# Patient Record
Sex: Female | Born: 1955
Health system: Southern US, Community
[De-identification: ages and names within clinical notes are randomized; demographics above are authoritative.]

## PROBLEM LIST (undated history)

## (undated) DIAGNOSIS — K219 Gastro-esophageal reflux disease without esophagitis: Secondary | ICD-10-CM

## (undated) DIAGNOSIS — I639 Cerebral infarction, unspecified: Secondary | ICD-10-CM

## (undated) DIAGNOSIS — N63 Unspecified lump in unspecified breast: Secondary | ICD-10-CM

## (undated) DIAGNOSIS — G459 Transient cerebral ischemic attack, unspecified: Secondary | ICD-10-CM

## (undated) DIAGNOSIS — E785 Hyperlipidemia, unspecified: Secondary | ICD-10-CM

## (undated) DIAGNOSIS — T8859XA Other complications of anesthesia, initial encounter: Secondary | ICD-10-CM

## (undated) DIAGNOSIS — R112 Nausea with vomiting, unspecified: Secondary | ICD-10-CM

## (undated) DIAGNOSIS — C3401 Malignant neoplasm of right main bronchus: Secondary | ICD-10-CM

## (undated) DIAGNOSIS — Z923 Personal history of irradiation: Secondary | ICD-10-CM

## (undated) DIAGNOSIS — T7840XA Allergy, unspecified, initial encounter: Secondary | ICD-10-CM

## (undated) DIAGNOSIS — Z9889 Other specified postprocedural states: Secondary | ICD-10-CM

## (undated) DIAGNOSIS — Z9225 Personal history of immunosupression therapy: Secondary | ICD-10-CM

## (undated) DIAGNOSIS — Z9221 Personal history of antineoplastic chemotherapy: Secondary | ICD-10-CM

## (undated) HISTORY — DX: Cerebral infarction, unspecified: I63.9

## (undated) HISTORY — DX: Unspecified lump in unspecified breast: N63.0

## (undated) HISTORY — DX: Hyperlipidemia, unspecified: E78.5

## (undated) HISTORY — PX: TUBAL LIGATION: SHX77

## (undated) HISTORY — PX: BREAST CYST ASPIRATION: SHX578

## (undated) HISTORY — DX: Allergy, unspecified, initial encounter: T78.40XA

## (undated) HISTORY — DX: Gastro-esophageal reflux disease without esophagitis: K21.9

## (undated) HISTORY — PX: CHOLECYSTECTOMY: SHX55

## (undated) HISTORY — PX: OVARY SURGERY: SHX727

---

## 2003-01-18 ENCOUNTER — Other Ambulatory Visit: Admission: RE | Admit: 2003-01-18 | Discharge: 2003-01-18 | Payer: Self-pay | Admitting: Family Medicine

## 2003-12-26 ENCOUNTER — Ambulatory Visit: Payer: Self-pay | Admitting: Family Medicine

## 2004-01-19 ENCOUNTER — Other Ambulatory Visit: Admission: RE | Admit: 2004-01-19 | Discharge: 2004-01-19 | Payer: Self-pay | Admitting: Family Medicine

## 2004-01-19 ENCOUNTER — Encounter (INDEPENDENT_AMBULATORY_CARE_PROVIDER_SITE_OTHER): Payer: Self-pay | Admitting: Internal Medicine

## 2004-01-19 ENCOUNTER — Ambulatory Visit: Payer: Self-pay | Admitting: Family Medicine

## 2004-01-25 ENCOUNTER — Ambulatory Visit: Payer: Self-pay | Admitting: Family Medicine

## 2004-02-27 ENCOUNTER — Ambulatory Visit: Payer: Self-pay | Admitting: Internal Medicine

## 2004-03-13 ENCOUNTER — Ambulatory Visit: Payer: Self-pay | Admitting: Family Medicine

## 2004-03-15 ENCOUNTER — Ambulatory Visit: Payer: Self-pay | Admitting: Family Medicine

## 2004-08-30 ENCOUNTER — Ambulatory Visit: Payer: Self-pay | Admitting: Family Medicine

## 2004-09-17 ENCOUNTER — Ambulatory Visit: Payer: Self-pay | Admitting: Family Medicine

## 2007-06-03 ENCOUNTER — Ambulatory Visit: Payer: Self-pay | Admitting: Family Medicine

## 2007-06-22 ENCOUNTER — Ambulatory Visit: Payer: Self-pay | Admitting: Family Medicine

## 2007-06-30 ENCOUNTER — Ambulatory Visit: Payer: Self-pay | Admitting: Family Medicine

## 2007-06-30 ENCOUNTER — Encounter: Payer: Self-pay | Admitting: Family Medicine

## 2007-11-27 ENCOUNTER — Ambulatory Visit: Payer: Self-pay | Admitting: Family Medicine

## 2007-12-02 ENCOUNTER — Encounter: Payer: Self-pay | Admitting: Family Medicine

## 2007-12-02 ENCOUNTER — Ambulatory Visit: Payer: Self-pay | Admitting: Family Medicine

## 2007-12-17 ENCOUNTER — Other Ambulatory Visit: Admission: RE | Admit: 2007-12-17 | Discharge: 2007-12-17 | Payer: Self-pay | Admitting: Family Medicine

## 2007-12-17 ENCOUNTER — Ambulatory Visit: Payer: Self-pay | Admitting: Family Medicine

## 2007-12-17 ENCOUNTER — Ambulatory Visit: Payer: Self-pay | Admitting: Cardiology

## 2007-12-17 ENCOUNTER — Encounter (INDEPENDENT_AMBULATORY_CARE_PROVIDER_SITE_OTHER): Payer: Self-pay | Admitting: Internal Medicine

## 2007-12-18 LAB — CONVERTED CEMR LAB
ALT: 24 units/L (ref 0–35)
AST: 24 units/L (ref 0–37)
Alkaline Phosphatase: 107 units/L (ref 39–117)
Bilirubin, Direct: 0.1 mg/dL (ref 0.0–0.3)
CO2: 32 meq/L (ref 19–32)
Cholesterol: 234 mg/dL (ref 0–200)
Eosinophils Relative: 1.5 % (ref 0.0–5.0)
Glucose, Bld: 96 mg/dL (ref 70–99)
Hemoglobin: 14.4 g/dL (ref 12.0–15.0)
Lymphocytes Relative: 32.2 % (ref 12.0–46.0)
Monocytes Relative: 6.3 % (ref 3.0–12.0)
Platelets: 296 10*3/uL (ref 150–400)
Potassium: 3.6 meq/L (ref 3.5–5.1)
RDW: 11.8 % (ref 11.5–14.6)
Sodium: 138 meq/L (ref 135–145)
Total CHOL/HDL Ratio: 6.7
Total Protein: 7.6 g/dL (ref 6.0–8.3)
VLDL: 34 mg/dL (ref 0–40)
WBC: 8.8 10*3/uL (ref 4.5–10.5)

## 2007-12-24 ENCOUNTER — Encounter (INDEPENDENT_AMBULATORY_CARE_PROVIDER_SITE_OTHER): Payer: Self-pay | Admitting: *Deleted

## 2007-12-25 ENCOUNTER — Encounter (INDEPENDENT_AMBULATORY_CARE_PROVIDER_SITE_OTHER): Payer: Self-pay | Admitting: Internal Medicine

## 2007-12-25 ENCOUNTER — Ambulatory Visit: Payer: Self-pay | Admitting: Family Medicine

## 2007-12-25 DIAGNOSIS — E7800 Pure hypercholesterolemia, unspecified: Secondary | ICD-10-CM | POA: Insufficient documentation

## 2008-02-08 ENCOUNTER — Ambulatory Visit: Payer: Self-pay | Admitting: Family Medicine

## 2008-02-10 LAB — CONVERTED CEMR LAB
AST: 32 units/L (ref 0–37)
HDL: 43.8 mg/dL (ref >39.0)
LDL Cholesterol: 103 mg/dL — ABNORMAL HIGH (ref 0–99)
Total CHOL/HDL Ratio: 4.1
VLDL: 32 mg/dL (ref 0–40)

## 2008-04-06 ENCOUNTER — Encounter (INDEPENDENT_AMBULATORY_CARE_PROVIDER_SITE_OTHER): Payer: Self-pay | Admitting: Internal Medicine

## 2008-05-17 ENCOUNTER — Ambulatory Visit: Payer: Self-pay | Admitting: Family Medicine

## 2008-05-17 LAB — CONVERTED CEMR LAB
Ketones, urine, test strip: NEGATIVE
Nitrite: NEGATIVE
Specific Gravity, Urine: <1.005
pH: 6

## 2008-05-18 ENCOUNTER — Encounter (INDEPENDENT_AMBULATORY_CARE_PROVIDER_SITE_OTHER): Payer: Self-pay | Admitting: Internal Medicine

## 2008-05-18 ENCOUNTER — Ambulatory Visit: Payer: Self-pay | Admitting: Cardiology

## 2008-05-18 DIAGNOSIS — K5730 Diverticulosis of large intestine without perforation or abscess without bleeding: Secondary | ICD-10-CM | POA: Insufficient documentation

## 2008-05-25 ENCOUNTER — Encounter (INDEPENDENT_AMBULATORY_CARE_PROVIDER_SITE_OTHER): Payer: Self-pay | Admitting: Internal Medicine

## 2008-05-31 ENCOUNTER — Encounter (INDEPENDENT_AMBULATORY_CARE_PROVIDER_SITE_OTHER): Payer: Self-pay | Admitting: Internal Medicine

## 2008-06-07 ENCOUNTER — Encounter (INDEPENDENT_AMBULATORY_CARE_PROVIDER_SITE_OTHER): Payer: Self-pay | Admitting: Internal Medicine

## 2008-06-09 ENCOUNTER — Encounter (INDEPENDENT_AMBULATORY_CARE_PROVIDER_SITE_OTHER): Payer: Self-pay | Admitting: Internal Medicine

## 2008-07-12 ENCOUNTER — Encounter (INDEPENDENT_AMBULATORY_CARE_PROVIDER_SITE_OTHER): Payer: Self-pay | Admitting: Obstetrics and Gynecology

## 2008-07-12 ENCOUNTER — Ambulatory Visit (HOSPITAL_COMMUNITY): Admission: RE | Admit: 2008-07-12 | Discharge: 2008-07-12 | Payer: Self-pay | Admitting: Obstetrics and Gynecology

## 2008-08-18 ENCOUNTER — Ambulatory Visit: Payer: Self-pay | Admitting: Family Medicine

## 2008-08-18 LAB — CONVERTED CEMR LAB
Cholesterol: 167 mg/dL (ref 0–200)
LDL Cholesterol: 95 mg/dL (ref 0–99)
Triglycerides: 130 mg/dL (ref 0.0–149.0)

## 2009-01-18 ENCOUNTER — Ambulatory Visit: Payer: Self-pay | Admitting: Family Medicine

## 2009-01-18 ENCOUNTER — Encounter (INDEPENDENT_AMBULATORY_CARE_PROVIDER_SITE_OTHER): Payer: Self-pay | Admitting: Internal Medicine

## 2009-01-18 ENCOUNTER — Other Ambulatory Visit: Admission: RE | Admit: 2009-01-18 | Discharge: 2009-01-18 | Payer: Self-pay | Admitting: Family Medicine

## 2009-01-18 DIAGNOSIS — Z780 Asymptomatic menopausal state: Secondary | ICD-10-CM | POA: Insufficient documentation

## 2009-01-18 DIAGNOSIS — F17200 Nicotine dependence, unspecified, uncomplicated: Secondary | ICD-10-CM | POA: Insufficient documentation

## 2009-01-19 LAB — CONVERTED CEMR LAB
CO2: 30 meq/L (ref 19–32)
Calcium: 9.5 mg/dL (ref 8.4–10.5)
Glucose, Bld: 82 mg/dL (ref 70–99)
HDL: 46.4 mg/dL (ref >39.00)
LDL Cholesterol: 79 mg/dL (ref 0–99)
Sodium: 141 meq/L (ref 135–145)
Total CHOL/HDL Ratio: 3
Triglycerides: 94 mg/dL (ref 0.0–149.0)
VLDL: 18.8 mg/dL (ref 0.0–40.0)

## 2009-01-23 ENCOUNTER — Encounter (INDEPENDENT_AMBULATORY_CARE_PROVIDER_SITE_OTHER): Payer: Self-pay | Admitting: *Deleted

## 2009-01-25 ENCOUNTER — Encounter (INDEPENDENT_AMBULATORY_CARE_PROVIDER_SITE_OTHER): Payer: Self-pay | Admitting: Internal Medicine

## 2009-01-25 ENCOUNTER — Ambulatory Visit: Payer: Self-pay | Admitting: Family Medicine

## 2009-01-27 ENCOUNTER — Encounter (INDEPENDENT_AMBULATORY_CARE_PROVIDER_SITE_OTHER): Payer: Self-pay | Admitting: Internal Medicine

## 2009-01-27 DIAGNOSIS — M8580 Other specified disorders of bone density and structure, unspecified site: Secondary | ICD-10-CM | POA: Insufficient documentation

## 2009-02-02 ENCOUNTER — Ambulatory Visit: Payer: Self-pay | Admitting: Family Medicine

## 2009-02-02 ENCOUNTER — Encounter (INDEPENDENT_AMBULATORY_CARE_PROVIDER_SITE_OTHER): Payer: Self-pay | Admitting: Internal Medicine

## 2009-02-09 ENCOUNTER — Encounter: Payer: Self-pay | Admitting: Family Medicine

## 2009-07-24 ENCOUNTER — Ambulatory Visit: Payer: Self-pay | Admitting: Family Medicine

## 2009-07-24 LAB — CONVERTED CEMR LAB
AST: 28 units/L (ref 0–37)
Albumin: 4.1 g/dL (ref 3.5–5.2)
Alkaline Phosphatase: 69 units/L (ref 39–117)
Bilirubin, Direct: 0.1 mg/dL (ref 0.0–0.3)
LDL Cholesterol: 82 mg/dL (ref 0–99)
Total Bilirubin: 0.3 mg/dL (ref 0.3–1.2)
Total CHOL/HDL Ratio: 3
Triglycerides: 96 mg/dL (ref 0.0–149.0)

## 2009-08-01 ENCOUNTER — Ambulatory Visit: Payer: Self-pay | Admitting: Family Medicine

## 2009-08-01 DIAGNOSIS — M7542 Impingement syndrome of left shoulder: Secondary | ICD-10-CM | POA: Insufficient documentation

## 2010-01-23 ENCOUNTER — Ambulatory Visit: Payer: Self-pay | Admitting: Family Medicine

## 2010-01-23 LAB — CONVERTED CEMR LAB
AST: 27 units/L (ref 0–37)
BUN: 14 mg/dL (ref 6–23)
Calcium: 9.3 mg/dL (ref 8.4–10.5)
Cholesterol: 152 mg/dL (ref 0–200)
Creatinine, Ser: 0.8 mg/dL (ref 0.4–1.2)
GFR calc non Af Amer: 85.48 mL/min (ref >60.00)
LDL Cholesterol: 83 mg/dL (ref 0–99)
Potassium: 4 meq/L (ref 3.5–5.1)
Total Bilirubin: 0.4 mg/dL (ref 0.3–1.2)
VLDL: 24 mg/dL (ref 0.0–40.0)

## 2010-01-26 ENCOUNTER — Ambulatory Visit: Payer: Self-pay | Admitting: Family Medicine

## 2010-01-26 LAB — CONVERTED CEMR LAB
Cholesterol, target level: 200 mg/dL
HDL goal, serum: 40 mg/dL
LDL Goal: 160 mg/dL

## 2010-03-13 NOTE — Letter (Signed)
Summary: Results Follow up Letter  [place] at [facility]  [address] [place], [place] [address]   Phone: [phone]  Fax: [phone]    02/09/2009 MRN: [id number]    Connie West [address] [place], [place]  [address]    Dear Ms. Sun,  The following are the results of your recent test(s):  Test         Result    Pap Smear:        Normal _____  Not Normal _____ Comments: ______________________________________________________ Cholesterol: LDL(Bad cholesterol):         Your goal is less than:         HDL (Good cholesterol):       Your goal is more than: Comments:  ______________________________________________________ Mammogram:        Normal __X___  Not Normal _____ Comments:Repeat in one year.  ___________________________________________________________________ Hemoccult:        Normal _____  Not normal _______ Comments:    _____________________________________________________________________ Other Tests:    We routinely do not discuss normal results over the telephone.  If you desire a copy of the results, or you have any questions about this information we can discuss them at your next office visit.   Sincerely,    Everrett Coombe, FNP  BB/ri

## 2010-03-13 NOTE — Assessment & Plan Note (Signed)
Summary: pain in shoulder/ billies patient   Vital Signs:  Patient profile:   55 year old female Height:      64.5 inches Weight:      123.4 pounds BMI:     20.93 Temp:     98.7 degrees F oral Pulse rate:   72 / minute Pulse rhythm:   regular BP sitting:   120 / 70  (left arm) Cuff size:   regular  Vitals Entered By: Benny Lennert CMA Duncan Dull) (August 01, 2009 3:40 PM)  History of Present Illness: Chief complaint Left shoulder pain  55 year old female:  Ongoing for a week  less shoulder pain, has a dull ache, in the anterolateral portion of the  shoulder, in and around the coracoid region in the subacromial region. Initially she also complained of a dull ache, sometimes is awakened up at night. She also has some lateral pain. Some mild pain with abduction and with crossing-over.  REVIEW OF SYSTEMS  GEN: No systemic complaints, no fevers, chills, sweats, or other acute illnesses MSK: Detailed in the HPI GI: tolerating PO intake without difficulty Neuro: No numbness, parasthesias, or tingling associated. Otherwise the pertinent positives of the ROS are noted above.    GEN: Well-developed,well-nourished,in no acute distress; alert,appropriate and cooperative throughout examination HEENT: Normocephalic and atraumatic without obvious abnormalities. No apparent alopecia or balding. Ears, externally no deformities PULM: Breathing comfortably in no respiratory distress EXT: No clubbing, cyanosis, or edema PSYCH: Normally interactive. Cooperative during the interview. Pleasant. Friendly and conversant. Not anxious or depressed appearing. Normal, full affect.   Low shoulder: Full range of motion. There is some scapular dyskinesis noted. Nontender at the acromioclavicular joint.  Minimal tenderness bicipital groove. There is some fullness the subacromial bursa. Eyelid positive crossover test.   Strength testing is 5/5 throughout.  Negative drop test. Mild positive Leanord Asal  test. Mildly positive Neer test.  No history of trauma or fracture. No history of operative intervention Allergies: 1)  ! Pcn  Past History:  Past medical, surgical, family and social histories (including risk factors) reviewed, and no changes noted (except as noted below).  Past Medical History: Reviewed history from 12/25/2007 and no changes required. Right breast mass- biopsy 2001, negative Hyperlipidemia (01/2004)  Past Surgical History: Reviewed history from 01/18/2009 and no changes required. diagnostic mammo 12/04/07--neg Cholecystectomy Caesarean section R breast mass bx--2001--neg CT abd and pelvis--05/18/2008--diverticulum and uterus to R--? fibroids removal of R ovarian fibroid--06/2008  Family History: Reviewed history from 01/18/2009 and no changes required. Mother, BRCA, dx in 73's, had some kind of colon problem--? polyps or cancer--in [place] No other known history of BRCA, uterine, cervical or ovarian cancer  Social History: Reviewed history from 12/17/2007 and no changes required. From [place] originally Marital Status: Married--seperated for 5 yrs Children: 2--adult, live withe her,  1 grandson, lives with her Occupation: Medi--Ad Assist   Impression & Recommendations:  Problem # 1:  SHOULDER IMPINGEMENT SYNDROME, LEFT (ICD-726.2) Assessment New cuff tendinopathy and  subacromial bursitis.  Conservative treatment, rehabilitation reviewed.  Given there band, formal physical therapy for rotator cuff strengthening and scapular stabilization  relatively mild, expected to do well. Follow up here as needed   Orders: Physical Therapy Referral (PT)  Complete Medication List: 1)  Zocor 40 Mg Tabs (Simvastatin) .Marland Kitchen.. 1 once daily for cholesterol by mouth 2)  Fish Oil 1000 Mg Caps (Omega-3 fatty acids) .... Take one by mouth daily 3)  Ibuprofen 200 Mg Caps (Ibuprofen) .... As needed 4)  Diclofenac Sodium 75 Mg Tbec (Diclofenac sodium) .Marland Kitchen.. 1 by mouth two  times a day  Patient Instructions: 1)  Referral Appointment Information 2)  Day/Date: 3)  Time: 4)  Place/MD: 5)  Address: 6)  Phone/Fax: 7)  Patient given appointment information. Information/Orders faxed/mailed.  Prescriptions: DICLOFENAC SODIUM 75 MG TBEC (DICLOFENAC SODIUM) 1 by mouth two times a day  #60 x 1   Entered and Authorized by:   Hannah Beat MD   Signed by:   Hannah Beat MD on 08/01/2009   Method used:   Print then Give to Patient   RxID:   [id number]    Current Allergies (reviewed today): ! PCN

## 2010-03-15 NOTE — Assessment & Plan Note (Signed)
Summary: CPX Connie West  R/S FROM 01/25/10   Vital Signs:  Patient profile:   55 year old female Height:      64.5 inches Weight:      132.25 pounds BMI:     22.43 Temp:     98.3 degrees F oral Pulse rate:   72 / minute Pulse rhythm:   regular BP sitting:   120 / 80  (left arm) Cuff size:   regular  Vitals Entered By: Benny Lennert CMA Duncan Dull) (January 26, 2010 11:12 AM)  History of Present Illness: Chief complaint cpx  The patient is here for annual wellness exam and preventative care.     Last DEXA 2010.. showed osteopenia  Had R ovarian fibroid removed 06/2008--has done well since  BP well controlled.  Osteopenia.Marland Kitchen no exercise.. taking calcium and vit D daily.   Lipid Management History:      Positive NCEP/ATP III risk factors include current tobacco user.  Negative NCEP/ATP III risk factors include female age less than 72 years old.        Her compliance with the TLC diet is excellent.  The patient expresses understanding of adjunctive measures for cholesterol lowering.  Adjunctive measures started by the patient include aerobic exercise, ASA, and weight reduction.  She expresses no side effects from her lipid-lowering medication.  The patient denies any symptoms to suggest myopathy or liver disease.     Preventive Screening-Counseling & Management  Alcohol-Tobacco     Smoking Status: current     Smoking Cessation Counseling: yes     Smoke Cessation Stage: contemplative     Tobacco Counseling: to quit use of tobacco products  Caffeine-Diet-Exercise     Diet Comments: fruits and veggies, healthy, water     Diet Counseling: not indicated; diet is assessed to be healthy     Does Patient Exercise: no     Exercise Counseling: to improve exercise regimen  Problems Prior to Update: 1)  Shoulder Impingement Syndrome, Left  (ICD-726.2) 2)  Osteopenia  (ICD-733.90) 3)  Tobacco Abuse  (ICD-305.1) 4)  Postmenopausal Status  (ICD-V49.81) 5)  Adnexal Mass, Right   (ICD-789.39) 6)  Uterine Enlargement  (ICD-621.2) 7)  Diverticulosis, Colon  (ICD-562.10) 8)  Flank Pain, Right  (ICD-789.09) 9)  Breast Mass, Right--2001--benign  (ICD-611.72) 10)  Hyperlipidemia  (ICD-272.4) 11)  Mass, Chest Wall  (ICD-786.6) 12)  Well Adult Exam  (ICD-V70.0) 13)  Breast Mass, Left  (ICD-611.72) 14)  Other Screening Mammogram  (ICD-V76.12) 15)  Skin Rash  (ICD-782.1) 16)  Bronchitis, Acute  (ICD-466.0)  Current Medications (verified): 1)  Zocor 40 Mg Tabs (Simvastatin) .Marland Kitchen.. 1 Once Daily For Cholesterol By Mouth 2)  Fish Oil 1000 Mg Caps (Omega-3 Fatty Acids) .... Take One By Mouth Daily 3)  Ibuprofen 200 Mg Caps (Ibuprofen) .... As Needed  Allergies: 1)  ! Pcn  Past History:  Past medical, surgical, family and social histories (including risk factors) reviewed, and no changes noted (except as noted below).  Past Medical History: Reviewed history from 12/25/2007 and no changes required. Right breast mass- biopsy 2001, negative Hyperlipidemia (01/2004)  Past Surgical History: Reviewed history from 01/18/2009 and no changes required. diagnostic mammo 12/04/07--neg Cholecystectomy Caesarean section R breast mass bx--2001--neg CT abd and pelvis--05/18/2008--diverticulum and uterus to R--? fibroids removal of R ovarian fibroid--06/2008  Family History: Reviewed history from 01/18/2009 and no changes required. Mother, BRCA, dx in 12's, had some kind of colon problem--? polyps or cancer--in [place] No other known history of BRCA,  uterine, cervical or ovarian cancer  Social History: Reviewed history from 12/17/2007 and no changes required. From [place] originally Marital Status: Married--seperated for 5 yrs Children: 2--adult, live withe her,  1 grandson, lives with her Occupation: Medi--Ad Assist  Review of Systems General:  Denies fatigue and fever. CV:  Denies chest pain or discomfort. Resp:  Denies shortness of breath. GI:  Denies abdominal pain,  constipation, and diarrhea. GU:  Denies abnormal vaginal bleeding and dysuria. Psych:  Denies anxiety and depression.  Physical Exam  General:  Well-developed,well-nourished,in no acute distress; alert,appropriate and cooperative throughout examination Eyes:  No corneal or conjunctival inflammation noted. EOMI. Perrla. Funduscopic exam benign, without hemorrhages, exudates or papilledema. Vision grossly normal. Ears:  External ear exam shows no significant lesions or deformities.  Otoscopic examination reveals clear canals, tympanic membranes are intact bilaterally without bulging, retraction, inflammation or discharge. Hearing is grossly normal bilaterally. Nose:  External nasal examination shows no deformity or inflammation. Nasal mucosa are pink and moist without lesions or exudates. Mouth:  Oral mucosa and oropharynx without lesions or exudates.  Teeth in good repair. Neck:  no carotid bruit or thyromegaly no cervical or supraclavicular lymphadenopathy  Lungs:  Normal respiratory effort, chest expands symmetrically. Lungs are clear to auscultation, no crackles or wheezes. Heart:  Normal rate and regular rhythm. S1 and S2 normal without gallop, murmur, click, rub or other extra sounds. Abdomen:  Bowel sounds positive,abdomen soft and non-tender without masses, organomegaly or hernias noted. Genitalia:  Pelvic Exam:        External: normal female genitalia without lesions or masses        Vagina: normal without lesions or masses                Adnexa: normal bimanual exam without masses or fullness        Uterus: normal by palpation        Pap smear: not performed Msk:  No deformity or scoliosis noted of thoracic or lumbar spine.   Pulses:  R and L posterior tibial pulses are full and equal bilaterally  Extremities:  no edmea  Skin:  Intact without suspicious lesions or rashes Psych:  Cognition and judgment appear intact. Alert and cooperative with normal attention span and concentration.  No apparent delusions, illusions, hallucinations   Impression & Recommendations:  Problem # 1:  WELL ADULT EXAM (ICD-V70.0) The patient's preventative maintenance and recommended screening tests for an annual wellness exam were reviewed in full today. Brought up to date unless services declined.  Counselled on the importance of diet, exercise, and its role in overall health and mortality. The patient's FH and SH was reviewed, including their home life, tobacco status, and drug and alcohol status.     Problem # 2:  ROUTINE GYNECOLOGICAL EXAMINATION (ICD-V72.31) DVE no pap.Marland Kitchen PAP q 2-3 years.  Problem # 3:  TOBACCO ABUSE (ICD-305.1)  Encouraged smoking cessation and discussed different methods for smoking cessation.   Orders: Tobacco use cessation intermediate 3-10 minutes ([address])  Complete Medication List: 1)  Zocor 40 Mg Tabs (Simvastatin) .Marland Kitchen.. 1 once daily for cholesterol by mouth 2)  Fish Oil 1000 Mg Caps (Omega-3 fatty acids) .... Take one by mouth daily 3)  Ibuprofen 200 Mg Caps (Ibuprofen) .... As needed  Other Orders: Radiology Referral (Radiology) Gastroenterology Referral (GI)  Lipid Assessment/Plan:      Based on NCEP/ATP III, the patient's risk factor category is "0-1 risk factors".  The patient's lipid goals are as follows: Total cholesterol goal  is 200; LDL cholesterol goal is 160; HDL cholesterol goal is 40; Triglyceride goal is 150.  Her LDL cholesterol goal has been met.     Patient Instructions: 1)  Referral Appointment Information 2)  Day/Date: 3)  Time: 4)  Place/MD: 5)  Address: 6)  Phone/Fax: 7)  Patient given appointment information. Information/Orders faxed/mailed.  8)  It is important that you exercise reguarly at least 20 minutes 5 times a week. If you develop chest pain, have severe difficulty breathing, or feel very tired, stop exercising immediately and seek medical attention.  9)  Please schedule a follow-up appointment in 1 year.    Orders  Added: 1)  Radiology Referral [Radiology] 2)  Gastroenterology Referral [GI] 3)  Est. Patient 40-64 years [[address]] 4)  Tobacco use cessation intermediate 3-10 minutes [[address]406]    Current Allergies (reviewed today): ! PCN  Flu Vaccine Result Date:  01/26/2010 Flu Vaccine Result:  given Flu Vaccine Next Due:  1 yr Flex Sig Next Due:  Not Indicated Hemoccult Next Due:  Not Indicated Last PAP:  NEGATIVE FOR INTRAEPITHELIAL LESIONS OR MALIGNANCY. (01/18/2009 12:00:00 AM) PAP Result Date:  01/26/2010 PAP Result:  DVE, no pap, pap every 2-3 years  PAP Next Due:  1 yr  Appended Document: CPX /lsf  R/S FROM 01/25/10 Flu Vaccine Consent Questions     Do you have a history of severe allergic reactions to this vaccine? no    Any prior history of allergic reactions to egg and/or gelatin? no    Do you have a sensitivity to the preservative Thimersol? no    Do you have a past history of Guillan-Barre Syndrome? no    Do you currently have an acute febrile illness? no    Have you ever had a severe reaction to latex? no    Vaccine information given and explained to patient? yes    Are you currently pregnant? no    Lot Number:[id number]   Exp Date:08/11/2010   Site Given  Left Deltoid IM    Clinical Lists Changes  Orders: Added new Service order of Admin 1st Vaccine ([address]) - Signed Added new Service order of Flu Vaccine 71yrs + [phone]) - Signed Observations: Added new observation of FLU VAX VIS: 09/05/09 version (01/26/2010 11:52) Added new observation of FLU VAXLOT: [id number] (01/26/2010 11:52) Added new observation of FLU VAXMFR: [employer] (01/26/2010 11:52) Added new observation of FLU VAX EXP: 08/11/2010 (01/26/2010 11:52) Added new observation of FLU VAX DSE: 0.17ml (01/26/2010 11:52) Added new observation of FLU VAX: Fluvax 3+ (01/26/2010 11:52)

## 2010-03-19 ENCOUNTER — Encounter: Payer: Self-pay | Admitting: Family Medicine

## 2010-03-19 ENCOUNTER — Ambulatory Visit: Payer: Self-pay | Admitting: Family Medicine

## 2010-03-22 ENCOUNTER — Encounter (INDEPENDENT_AMBULATORY_CARE_PROVIDER_SITE_OTHER): Payer: Self-pay | Admitting: *Deleted

## 2010-03-29 NOTE — Letter (Signed)
Summary: Results Follow up Letter  [place] at [facility]  [address] [place], [place] [address]   Phone: [phone]  Fax: [phone]    03/22/2010 MRN: [id number]     BRADLEIGH SONNEN [address] [place], [place]  [address]    Dear Ms. Chaudhary,  The following are the results of your recent test(s):  Test         Result    Pap Smear:        Normal _____  Not Normal _____ Comments: ______________________________________________________ Cholesterol: LDL(Bad cholesterol):         Your goal is less than:         HDL (Good cholesterol):       Your goal is more than: Comments:  ______________________________________________________ Mammogram:        Normal __x___  Not Normal _____ Comments:Repeat in 1 year  ___________________________________________________________________ Hemoccult:        Normal _____  Not normal _______ Comments:    _____________________________________________________________________ Other Tests:    We routinely do not discuss normal results over the telephone.  If you desire a copy of the results, or you have any questions about this information we can discuss them at your next office visit.   Sincerely,  Kerby Nora MD

## 2010-04-06 ENCOUNTER — Encounter: Payer: Self-pay | Admitting: Family Medicine

## 2010-04-23 ENCOUNTER — Ambulatory Visit: Payer: Self-pay | Admitting: Gastroenterology

## 2010-04-23 ENCOUNTER — Encounter: Payer: Self-pay | Admitting: Family Medicine

## 2010-04-24 NOTE — Consult Note (Signed)
Summary: [facility]  [facility]   Imported By: Maryln Gottron 04/19/2010 13:28:35  _____________________________________________________________________  External Attachment:    Type:   Image     Comment:   External Document

## 2010-04-30 LAB — HM COLONOSCOPY

## 2010-05-10 NOTE — Procedures (Signed)
Summary: Colonoscopy Report/[facility]  Colonoscopy Report/[facility]   Imported By: Maryln Gottron 04/30/2010 15:51:00  _____________________________________________________________________  External Attachment:    Type:   Image     Comment:   External Document  Appended Document: Orders Update    Clinical Lists Changes  Observations: Added new observation of COLONNXTDUE: 04/22/2020 (04/30/2010 16:51) Added new observation of LST COLON DT: 04/23/2010 (04/23/2010 16:51) Added new observation of COLONOSCOPY: diverticulosis (04/23/2010 16:51)      Colonoscopy Result Date:  04/23/2010 Colonoscopy Result:  diverticulosis Colonoscopy Next Due:  10 yr

## 2010-05-21 LAB — CBC
Platelets: 252 10*3/uL (ref 150–400)
WBC: 8.8 10*3/uL (ref 4.0–10.5)

## 2010-06-26 NOTE — Op Note (Signed)
Connie West, Connie West                  ACCOUNT NO.:  [id number]   MEDICAL RECORD NO.:  [id number]          PATIENT TYPE:  AMB   LOCATION:  SDC                           FACILITY:  [facility]   PHYSICIAN:  Juluis Mire, M.D.   DATE OF BIRTH:  [birth year]   DATE OF PROCEDURE:  07/12/2008  DATE OF DISCHARGE:                               OPERATIVE REPORT   PREOPERATIVE DIAGNOSIS:  Solitary right adnexal mass.   POSTOPERATIVE DIAGNOSIS:  Solid ovarian tumor.   OPERATIVE PROCEDURE:  1. Open laparoscopy.  2. Lysis of adhesions.  3. Right salpingo-oophorectomy.  4. Cystoscopy.   SURGEON:  Juluis Mire, MD   ANESTHESIA:  General.   ESTIMATED BLOOD LOSS:  100-200 mL.   PACKS AND DRAINS:  None.   INTRAOPERATIVE BLOOD PLACED:  None.   COMPLICATIONS:  None.   INDICATIONS:  As dictated in history and physical.   PROCEDURE:  The patient was taken to the OR and placed in supine  position.  After satisfactory level of general endotracheal anesthesia  was obtained, the abdomen, perineum, and vagina were prepped out with  Betadine.  The bladder was emptied by in and out catheterization.  A  Hulka tenaculum was put in place.  The patient was then draped in  sterile field.  A subumbilical incision was made with the knife.  Incision was extended through subcutaneous tissue to the fascia.  Fascia  was entered sharply.  Peritoneum was entered with blunt finger pressure.  The open laparoscopic trocar was put in place and secured.  The abdomen  was inflated with carbon dioxide.  The laparoscope was introduced.  The  omental adhesions and left lower quadrant were noted.  A 5-mm trocar was  put in place in a suprapubic area.  Uterus was elevated.  It was noted  that she had a large solid ovarian tumor on the right ovary.  There was  no evidence of ascites or excrescences, it looked like to be a benign  process.  Washings were then obtained.  Using the unipolar scissors, the  omentum was taken down from  the left lower quadrant.  Another 5-mm  trocar was put in place in the left lower quadrant after visualization  of the epigastric vessels.  The suprapubic incision was extended and a  10/11 trocar was put in place in this area.  Next, the ovary was  elevated.  The ureter was easily identified along the right pelvic  sidewall.  Using the bipolar, the ovarian vessels were isolated,  cauterized, and incised.  We continued this process up to the utero-  ovarian ligament.  The utero-ovarian ligament was then cauterized and  incised.  We had good hemostasis along the right pelvic sidewall.  The  ovary and tube were placed in an Endobag and brought out through the  suprapubic incision.  The incision was extended slightly.  We were able  to morcellate the ovary and eventually remove it.  It was all sent for  pathology.  At this point in time, we re-inflated the abdomen.  The  laparoscope was  reintroduced for visualization with no evidence of any  active bleeding but thoroughly irrigated the pelvis.  There was no  injury to adjacent organs.  The abdomen was deflated with carbon  dioxide.  All trocars removed.  Subumbilical fascia was closed with  figure-of-eight of 0 Vicryl.  Skin with interrupted subcuticular 4-0  Vicryl.  Suprapubic fascia was closed with locking suture of 0 Vicryl.  Skin was closed with a running subcuticular of 3-0 Vicryl.  The left  lower quadrant incision was closed with Dermabond.  The Hulka tenaculum  was then removed.   The patient was given indigo carmine.  Cystoscopy was performed.  Visualization revealed no evidence of any lacerations in the bladder.  Both ureteral orifices were visualized noted to be spilling large  amounts of blue-tinged urine.  At this point in time, the bladder was  emptied and the cystoscope was removed.  The patient was taken out of  the dorsal lithotomy position and extubated and transferred to recovery  room in good condition.  Sponges, needle  count was correct by  circulating nurse x2.      Juluis Mire, M.D.  Electronically Signed     JSM/MEDQ  D:  07/12/2008  T:  07/12/2008  Job:  [id number]

## 2010-06-26 NOTE — H&P (Signed)
Connie West, SELBE NO.:  [id number]   MEDICAL RECORD NO.:  [id number]          PATIENT TYPE:  AMB   LOCATION:  SDC                           FACILITY:  [facility]   PHYSICIAN:  Juluis Mire, M.D.   DATE OF BIRTH:  1955-08-21   DATE OF ADMISSION:  07/12/2008  DATE OF DISCHARGE:                              HISTORY & PHYSICAL   The patient is a 55 year old gravida 2, para 2, postmenopausal female  who is referred by [facility] for evaluation of adnexal mass.  The patient had a CT revealing asymmetrical enlargement of uterus.  They  did do a followup ultrasound that revealed a right-sided solid mass  measuring 6 x 6 x 3.  This could be a solid ovarian tumor versus a  pedunculated fibroid uterus.  Her CA-125 was normal.  We did a followup  ultrasound in our office, and I believe that represents the pedunculated  fibroids.  There was fully no blood clot seen.  In view of this, the  patient now presents for laparoscopic evaluation for possible right  salpingo-oophorectomy.   In terms of allergies, she is allergic to PENICILLIN.   MEDICATIONS:  1. Simvastatin 40 mg daily.  2. Fish oil.   PAST MEDICAL HISTORY:  She does have a history of hyperlipidemia on  medications as noted.   PREVIOUS OPERATIVE PROCEDURES:  She had a C-section in 1984, she had a  gallbladder surgery in 1992, and she did have a natural delivery in  1987.   Social history does reveal a pack per day tobacco use.  No alcohol.   FAMILY HISTORY:  There is a history of breast cancer.   REVIEW OF SYSTEMS:  Noncontributory.   PHYSICAL EXAMINATION:  VITAL SIGNS:  The patient is afebrile with stable  vital signs.  HEENT:  The patient is normocephalic.  Pupils equally round and reactive  to light and accommodation.  Extraocular movements are intact.  Sclerae  and conjunctivae are clear.  Oropharynx clear.  NECK:  Without thyromegaly.  BREASTS:  Not examined.  LUNGS:  Clear.  CARDIAC:   Regular in rate without murmurs or gallops.  ABDOMEN:  Benign.  No mass, organomegaly, or tenderness.  PELVIC:  Normal external genitalia.  Vaginal mucosa is clear.  Cervix  unremarkable.  Uterus enlarged with a right-sided fullness.  EXTREMITIES:  Trace edema.  NEUROLOGIC:  Grossly within normal limits.   IMPRESSION:  Right adnexal mass, probable fibroid versus solid ovarian  tumor.   PLAN:  The patient will undergo a laparoscopic evaluation with possible  right salpingo-oophorectomy.  The nature of the procedure have been  discussed.  The risks have been explained including the risk of  infection.  Risk of hemorrhage that could require transfusion with the  risk of AIDS or hepatitis.  Risk of injury to adjacent organs including  bladder, bowel, ureters that could require further exploratory surgery.  Risk of deep venous thrombosis and pulmonary emboli.  The patient  expressed understanding of the potential risks and complications.      Juluis Mire, M.D.  Electronically Signed     JSM/MEDQ  D:  07/12/2008  T:  07/12/2008  Job:  [id number]

## 2011-02-11 ENCOUNTER — Other Ambulatory Visit: Payer: Self-pay | Admitting: Family Medicine

## 2011-02-18 ENCOUNTER — Encounter: Payer: Self-pay | Admitting: Family Medicine

## 2011-02-19 ENCOUNTER — Other Ambulatory Visit (HOSPITAL_COMMUNITY)
Admission: RE | Admit: 2011-02-19 | Discharge: 2011-02-19 | Disposition: A | Discharge status: Home / Self Care | Payer: BC Managed Care – PPO | Source: Ambulatory Visit | Attending: Family Medicine | Admitting: Family Medicine

## 2011-02-19 ENCOUNTER — Ambulatory Visit (INDEPENDENT_AMBULATORY_CARE_PROVIDER_SITE_OTHER): Payer: BC Managed Care – PPO | Admitting: Family Medicine

## 2011-02-19 ENCOUNTER — Encounter: Payer: Self-pay | Admitting: Family Medicine

## 2011-02-19 VITALS — BP 120/72 | HR 88 | Temp 97.8°F | Ht 66.0 in | Wt 140.8 lb

## 2011-02-19 DIAGNOSIS — R252 Cramp and spasm: Secondary | ICD-10-CM

## 2011-02-19 DIAGNOSIS — E785 Hyperlipidemia, unspecified: Secondary | ICD-10-CM

## 2011-02-19 DIAGNOSIS — Z01419 Encounter for gynecological examination (general) (routine) without abnormal findings: Secondary | ICD-10-CM

## 2011-02-19 DIAGNOSIS — Z720 Tobacco use: Secondary | ICD-10-CM

## 2011-02-19 DIAGNOSIS — F17200 Nicotine dependence, unspecified, uncomplicated: Secondary | ICD-10-CM

## 2011-02-19 DIAGNOSIS — Z0000 Encounter for general adult medical examination without abnormal findings: Secondary | ICD-10-CM

## 2011-02-19 DIAGNOSIS — M8580 Other specified disorders of bone density and structure, unspecified site: Secondary | ICD-10-CM

## 2011-02-19 DIAGNOSIS — M899 Disorder of bone, unspecified: Secondary | ICD-10-CM

## 2011-02-19 DIAGNOSIS — Z1231 Encounter for screening mammogram for malignant neoplasm of breast: Secondary | ICD-10-CM

## 2011-02-19 DIAGNOSIS — Z1159 Encounter for screening for other viral diseases: Secondary | ICD-10-CM | POA: Insufficient documentation

## 2011-02-19 NOTE — Assessment & Plan Note (Signed)
May be due to statin. Eval with labs.. If normal will rec holding statin to see if symptoms improve.

## 2011-02-19 NOTE — Patient Instructions (Addendum)
Return for fasting labs in next few days. Can try 2 teaspoons yellow mustard for leg cramps at night. QUIT smoking. Call if interested in wellbutrin or chantix for quitting smoking. Work on [employer] and exercise.

## 2011-02-19 NOTE — Assessment & Plan Note (Addendum)
Counseled on cessation. Pt precontemplative. Offered suggestions.  PFTs done to eval given >25 pack year history of smoking: spirometry nml.

## 2011-02-19 NOTE — Progress Notes (Signed)
Subjective:    Patient ID: Connie West, female    DOB: 1955/04/08, 56 y.o.   MRN: [id number]  HPI  56 year old female presents for wellness exam.  She has been having leg cramps in B shins...ongoing for several months. Typically at night.  On same dose simvastatin daily.  Elevated Cholesterol:  Due for re-eval of chol, last year well controlled on simvastatin 40 mg daily.  Using medications without problems: maybe Muscle aches: yes Diet compliance: Adequate water in diet, eating moderately well Exercise: None Other complaints:    Review of Systems  Constitutional: Negative for fever, fatigue and unexpected weight change.  HENT: Negative for ear pain, congestion, sore throat, sneezing, trouble swallowing and sinus pressure.   Eyes: Negative for pain and itching.  Respiratory: Negative for cough, shortness of breath and wheezing.   Cardiovascular: Negative for chest pain, palpitations and leg swelling.  Gastrointestinal: Negative for nausea, abdominal pain, diarrhea, constipation and blood in stool.  Genitourinary: Negative for dysuria, hematuria, vaginal discharge and difficulty urinating.  Skin: Negative for rash.  Neurological: Negative for syncope, weakness, light-headedness, numbness and headaches.  Psychiatric/Behavioral: Negative for confusion and dysphoric mood. The patient is not nervous/anxious.        Objective:   Physical Exam  Constitutional: Vital signs are normal. She appears well-developed and well-nourished. She is cooperative.  Non-toxic appearance. She does not appear ill. No distress.  HENT:  Head: Normocephalic.  Right Ear: Hearing, tympanic membrane, external ear and ear canal normal.  Left Ear: Hearing, tympanic membrane, external ear and ear canal normal.  Nose: Nose normal.  Eyes: Conjunctivae, EOM and lids are normal. Pupils are equal, round, and reactive to light. No foreign bodies found.  Neck: Trachea normal and normal range of motion. Neck  supple. Carotid bruit is not present. No mass and no thyromegaly present.  Cardiovascular: Normal rate, regular rhythm, S1 normal, S2 normal, normal heart sounds and intact distal pulses.  Exam reveals no gallop.   No murmur heard. Pulmonary/Chest: Effort normal and breath sounds normal. No respiratory distress. She has no wheezes. She has no rhonchi. She has no rales.  Abdominal: Soft. Normal appearance and bowel sounds are normal. She exhibits no distension, no fluid wave, no abdominal bruit and no mass. There is no hepatosplenomegaly. There is no tenderness. There is no rebound, no guarding and no CVA tenderness. No hernia.  Genitourinary: Vagina normal and uterus normal. Rectal exam shows external hemorrhoid. No breast swelling, tenderness, discharge or bleeding. Pelvic exam was performed with patient prone. There is no rash, tenderness or lesion on the right labia. There is no rash, tenderness or lesion on the left labia. Uterus is not enlarged and not tender. Cervix exhibits no motion tenderness, no discharge and no friability. Right adnexum displays no mass, no tenderness and no fullness. Left adnexum displays no mass, no tenderness and no fullness.       Ext hemorrhoid tags.  Lymphadenopathy:    She has no cervical adenopathy.    She has no axillary adenopathy.  Neurological: She is alert. She has normal strength. No cranial nerve deficit or sensory deficit.  Skin: Skin is warm, dry and intact. No rash noted.  Psychiatric: Her speech is normal and behavior is normal. Judgment normal. Her mood appears not anxious. Cognition and memory are normal. She does not exhibit a depressed mood.          Assessment & Plan:  CPX: The patient's preventative maintenance and recommended screening tests  for an annual wellness exam were reviewed in full today. Brought up to date unless services declined.  Counselled on the importance of diet, exercise, and its role in overall health and mortality. The  patient's FH and SH was reviewed, including their home life, tobacco status, and drug and alcohol status.    Vaccines: uptodate with Td, flu  Mammo: 03/2010 nml, due now. Colon: nml 04/2010.Marland Kitchen Repeat in 10 years. PAP/DVE: Due this year, then space every 3 years. DXA: 2 years ago osteopenia.. Due for repeat Smoker: >25 pack year history. Rare cough. HAS tried patches in past.

## 2011-02-19 NOTE — Assessment & Plan Note (Signed)
Due for re-eval. Encouraged exercise, weight loss, healthy eating habits. MAy need to stop statin due to leg pain.

## 2011-02-21 ENCOUNTER — Ambulatory Visit: Payer: Self-pay | Admitting: Family Medicine

## 2011-02-22 ENCOUNTER — Other Ambulatory Visit (INDEPENDENT_AMBULATORY_CARE_PROVIDER_SITE_OTHER): Payer: BC Managed Care – PPO

## 2011-02-22 DIAGNOSIS — R252 Cramp and spasm: Secondary | ICD-10-CM

## 2011-02-22 DIAGNOSIS — E785 Hyperlipidemia, unspecified: Secondary | ICD-10-CM

## 2011-02-22 LAB — COMPREHENSIVE METABOLIC PANEL
AST: 29 U/L (ref 0–37)
Alkaline Phosphatase: 76 U/L (ref 39–117)
BUN: 12 mg/dL (ref 6–23)
Glucose, Bld: 94 mg/dL (ref 70–99)
Sodium: 141 mEq/L (ref 135–145)
Total Bilirubin: 0.6 mg/dL (ref 0.3–1.2)
Total Protein: 7.2 g/dL (ref 6.0–8.3)

## 2011-02-22 LAB — LIPID PANEL
Cholesterol: 178 mg/dL (ref 0–200)
LDL Cholesterol: 96 mg/dL (ref 0–99)
Triglycerides: 143 mg/dL (ref 0.0–149.0)
VLDL: 28.6 mg/dL (ref 0.0–40.0)

## 2011-02-26 ENCOUNTER — Encounter: Payer: Self-pay | Admitting: Family Medicine

## 2011-02-27 LAB — HM DEXA SCAN

## 2011-02-28 ENCOUNTER — Encounter: Payer: Self-pay | Admitting: Family Medicine

## 2011-02-28 ENCOUNTER — Encounter: Payer: Self-pay | Admitting: *Deleted

## 2011-03-12 ENCOUNTER — Ambulatory Visit (INDEPENDENT_AMBULATORY_CARE_PROVIDER_SITE_OTHER)
Admission: RE | Admit: 2011-03-12 | Discharge: 2011-03-12 | Disposition: A | Discharge status: Home / Self Care | Payer: BC Managed Care – PPO | Source: Ambulatory Visit | Attending: Family Medicine | Admitting: Family Medicine

## 2011-03-12 ENCOUNTER — Encounter: Payer: Self-pay | Admitting: Family Medicine

## 2011-03-12 ENCOUNTER — Ambulatory Visit (INDEPENDENT_AMBULATORY_CARE_PROVIDER_SITE_OTHER): Payer: BC Managed Care – PPO | Admitting: Family Medicine

## 2011-03-12 VITALS — BP 126/82 | HR 76 | Temp 98.2°F | Wt 142.2 lb

## 2011-03-12 DIAGNOSIS — R222 Localized swelling, mass and lump, trunk: Secondary | ICD-10-CM

## 2011-03-12 NOTE — Assessment & Plan Note (Signed)
Changing, check CXR - normal on my read. Anticipate inflammed lipoma. Will refer to surgery for further evaluation.

## 2011-03-12 NOTE — Progress Notes (Signed)
   Subjective:    Patient ID: Connie West, female    DOB: Oct 17, 1955, 56 y.o.   MRN: [id number]  HPI CC: left chest lump  Longstanding hx chest wall mass - has been evaluated in past for this - told fat deposit.  Noticed 4-5 d ago started getting more painful and has grown over time as well.  Improves with ibuprofen.  Using heating pad but unsure if this helped.  Feeling chest soreness with any arm movement.  Has had CT scan done (2009) - told fatty tumor.  Reviewed image - no chest wall mass noted.   No fevers/chills, abd pain, chest pain, n/v, SOB, coughing.  No other lumps, bumps, rashes anywhere else.  Past Surgical History  Procedure Date  . Cholecystectomy   . Cesarean section   . Breast surgery     right breast   . Ovary surgery    H/o breast biopsy 10- yrs ago, states told ok.  No problems since.  Due for mammogram 03/2011. Also had another skin mass removed from anterior to right ear 1988.  Does not know specifics for this  Review of Systems Per HPI    Objective:   Physical Exam  Nursing note and vitals reviewed. Constitutional: She appears well-developed and well-nourished. No distress.  HENT:  Head: Normocephalic and atraumatic.  Mouth/Throat: Oropharynx is clear and moist. No oropharyngeal exudate.  Cardiovascular: Normal rate, regular rhythm and intact distal pulses.   Murmur (2/6 SEM) heard. Pulmonary/Chest: Effort normal and breath sounds normal. No respiratory distress. She has no wheezes. She has no rales.  Skin: Skin is warm and dry.       3-4cm circumscribed lump just inferior to left sternoclavicular junction, firm  Psychiatric: She has a normal mood and affect.       Assessment & Plan:

## 2011-03-12 NOTE — Patient Instructions (Signed)
Xray today.   I think this is inflammed lipoma or benign fatty tumor. Pass by Marion's office for referral to surgeons for evaluation. Good to see you today, call us with questions.

## 2011-03-26 ENCOUNTER — Ambulatory Visit: Payer: Self-pay | Admitting: Family Medicine

## 2011-03-27 ENCOUNTER — Encounter: Payer: Self-pay | Admitting: Family Medicine

## 2011-03-28 ENCOUNTER — Encounter: Payer: Self-pay | Admitting: *Deleted

## 2011-04-01 ENCOUNTER — Encounter (INDEPENDENT_AMBULATORY_CARE_PROVIDER_SITE_OTHER): Payer: Self-pay | Admitting: Surgery

## 2011-04-01 ENCOUNTER — Ambulatory Visit (INDEPENDENT_AMBULATORY_CARE_PROVIDER_SITE_OTHER): Payer: BC Managed Care – PPO | Admitting: Surgery

## 2011-04-01 VITALS — BP 128/86 | HR 98 | Temp 99.0°F | Resp 18 | Ht 65.0 in | Wt 142.4 lb

## 2011-04-01 DIAGNOSIS — R222 Localized swelling, mass and lump, trunk: Secondary | ICD-10-CM

## 2011-04-01 NOTE — Progress Notes (Signed)
Patient ID: Connie West, female   DOB: 01-22-1956, 56 y.o.   MRN: [id number]  Chief Complaint  Patient presents with  . Mass    new pt- eval chest wall mass    HPI Connie West is a 56 y.o. female.  This is a pleasant female referred by Dr. Sharen Hones for evaluation of a chest wall mass. She reports first noticing a mass back in 2009. She had a CAT scan which was unremarkable. It was felt that this may represent a lipoma. It only intermittently hurts and becomes inflamed. She feels like it may have gotten slightly larger. Today she is in no pain. HPI  Past Medical History  Diagnosis Date  . Hyperlipidemia   . Breast mass     Past Surgical History  Procedure Date  . Cholecystectomy   . Cesarean section   . Ovary surgery   . Breast surgery     right breast bx    Family History  Problem Relation Age of Onset  . Cancer Mother     breast    Social History History  Substance Use Topics  . Smoking status: Current Everyday Smoker -- 0.7 packs/day  . Smokeless tobacco: Never Used  . Alcohol Use: No    Allergies  Allergen Reactions  . Penicillins     REACTION: hives    Current Outpatient Prescriptions  Medication Sig Dispense Refill  . calcium carbonate (OS-CAL) 600 MG TABS Take 600 mg by mouth 2 (two) times daily with a meal.      . cholecalciferol (VITAMIN D) 1000 UNITS tablet Take 1,000 Units by mouth daily.      . fish oil-omega-3 fatty acids 1000 MG capsule Take 2 g by mouth daily.      . simvastatin (ZOCOR) 40 MG tablet TAKE ONE TABLET BY MOUTH EVERY DAY FOR CHOLESTEROL  30 tablet  6    Review of Systems Review of Systems  Constitutional: Negative for fever, chills and unexpected weight change.  HENT: Negative for hearing loss, congestion, sore throat, trouble swallowing and voice change.   Eyes: Negative for visual disturbance.  Respiratory: Negative for cough and wheezing.   Cardiovascular: Negative for chest pain, palpitations and leg swelling.    Gastrointestinal: Negative for nausea, vomiting, abdominal pain, diarrhea, constipation, blood in stool, abdominal distention and anal bleeding.  Genitourinary: Negative for hematuria, vaginal bleeding and difficulty urinating.  Musculoskeletal: Negative for arthralgias.  Skin: Negative for rash and wound.  Neurological: Negative for seizures, syncope and headaches.  Hematological: Negative for adenopathy. Does not bruise/bleed easily.  Psychiatric/Behavioral: Negative for confusion.    Blood pressure 128/86, pulse 98, temperature 99 F (37.2 C), temperature source Temporal, resp. rate 18, height 5\' 5"  (1.651 m), weight 142 lb 6.4 oz (64.592 kg).  Physical Exam Physical Exam  Constitutional: She is oriented to person, place, and time. She appears well-developed and well-nourished. No distress.  HENT:  Head: Normocephalic and atraumatic.  Right Ear: External ear normal.  Left Ear: External ear normal.  Nose: Nose normal.  Mouth/Throat: Oropharynx is clear and moist. No oropharyngeal exudate.  Eyes: Right eye exhibits no discharge. Left eye exhibits no discharge. No scleral icterus.  Neck: Normal range of motion. Neck supple. No tracheal deviation present. No thyromegaly present.  Cardiovascular: Normal rate, regular rhythm, normal heart sounds and intact distal pulses.  Exam reveals no gallop.   No murmur heard. Pulmonary/Chest: Effort normal and breath sounds normal. No respiratory distress. She has no wheezes.  There is a firm mass near the sternum on the left side just below the clavicle. It is firm and fixed. There are no skin changes. It does not to consistent with lipoma  Abdominal: Soft. Bowel sounds are normal.  Musculoskeletal: Normal range of motion. She exhibits no edema and no tenderness.  Lymphadenopathy:    She has no cervical adenopathy.  Neurological: She is alert and oriented to person, place, and time.  Skin: Skin is warm and dry. She is not diaphoretic. No  erythema. No pallor.  Psychiatric: Her behavior is normal. Judgment normal.    Data Reviewed   Assessment    Patient with a left chest wall mass of uncertain etiology.    Plan    This is not to consistent with a lipoma on examination. It may represent costochondritis or some accessory bone.  We discussed re imaging this area versus referral to a thoracic surgeon. She is going to discuss this with her primary care physician. I recommended anti-inflammatories and ice when needed. I doubt malignancy given the extent of time she has had this. I will see her back as needed       Reshad Saab A 04/01/2011, 4:11 PM

## 2011-10-07 ENCOUNTER — Encounter: Payer: Self-pay | Admitting: Family Medicine

## 2011-10-07 ENCOUNTER — Ambulatory Visit (INDEPENDENT_AMBULATORY_CARE_PROVIDER_SITE_OTHER): Payer: BC Managed Care – PPO | Admitting: Family Medicine

## 2011-10-07 VITALS — BP 120/80 | HR 84 | Temp 98.0°F | Wt 144.0 lb

## 2011-10-07 DIAGNOSIS — J069 Acute upper respiratory infection, unspecified: Secondary | ICD-10-CM

## 2011-10-07 MED ORDER — HYDROCODONE-HOMATROPINE 5-1.5 MG/5ML PO SYRP: ORAL_SOLUTION | ORAL | Status: AC

## 2011-10-07 NOTE — Patient Instructions (Addendum)
Take Guaifenesin (400mg ), take 11/2 tabs by mouth AM and NOON. Get GUAIFENESIN by  going to [facility] or [facility] and getting MUCOUS RELIEF EXPECTORANT/CONGESTION. DO NOT GET MUCINEX (Timed Release Guaifenesin)  Can also use sudafed during the day (plain sudafed for decongestion)

## 2011-10-07 NOTE — Progress Notes (Signed)
Nature conservation officer at [facility] [address] [place] [address] Phone: [phone] Fax: [phone]  Date:  10/07/2011   Name:  Connie West   DOB:  05-Apr-1955   MRN:  [id number] Gender: female Age: 56 y.o.  PCP:  Kerby Nora, MD    Patient Name: Connie West Date of Birth: 1955-03-02 Medical Record Number: [id number]  History of Present Illness:  Patent presents with runny nose, sneezing, cough, sore throat, malaise and minimal / low-grade fever .  Coughing, nose  No fever, bt has been taking some pain pills Ibuprofen  First two days with some sore throat HA was the worst  ? recent exposure to others with similar symptoms.   The patent denies sore throat as the primary complaint. Denies sthortness of breath/wheezing, high fever, chest pain, rhinits for more than 14 days, significant myalgia, otalgia, facial pain, abdominal pain, changes in bowel or bladder.  PMH, PHS, Allergies, Problem List, Medications, Family History, and Social History have all been reviewed.  Patient Active Problem List  Diagnosis  . HYPERLIPIDEMIA  . TOBACCO ABUSE  . DIVERTICULOSIS, COLON  . SHOULDER IMPINGEMENT SYNDROME, LEFT  . OSTEOPENIA  . POSTMENOPAUSAL STATUS  . Leg cramps  . Chest wall mass    Past Medical History  Diagnosis Date  . Hyperlipidemia   . Breast mass     Past Surgical History  Procedure Date  . Cholecystectomy   . Cesarean section   . Ovary surgery   . Breast surgery     right breast bx    History  Substance Use Topics  . Smoking status: Current Everyday Smoker -- 0.7 packs/day  . Smokeless tobacco: Never Used  . Alcohol Use: No    Family History  Problem Relation Age of Onset  . Cancer Mother     breast    Allergies  Allergen Reactions  . Penicillins     REACTION: hives    Current Outpatient Prescriptions on File Prior to Visit  Medication Sig Dispense Refill  . calcium carbonate (OS-CAL) 600 MG TABS Take 600 mg by mouth 2 (two)  times daily with a meal.      . cholecalciferol (VITAMIN D) 1000 UNITS tablet Take 1,000 Units by mouth daily.      . fish oil-omega-3 fatty acids 1000 MG capsule Take 2 g by mouth daily.      . simvastatin (ZOCOR) 40 MG tablet TAKE ONE TABLET BY MOUTH EVERY DAY FOR CHOLESTEROL  30 tablet  6     Review of Systems: as above, eating and drinking - tolerating PO. Urinating normally. No excessive vomitting or diarrhea. O/w as above.  Physical Exam:  Filed Vitals:   10/07/11 1603  BP: 120/80  Pulse: 84  Temp: 98 F (36.7 C)  TempSrc: Tympanic  Weight: 144 lb (65.318 kg)  SpO2: 97%    GEN: WDWN, Non-toxic, Atraumatic, normocephalic. A and O x 3. HEENT: Oropharynx clear without exudate, MMM, no significant LAD, mild rhinnorhea Ears: TM clear, COL visualized with good landmarks CV: RRR, no m/g/r. Pulm: CTA B, no wheezes, rhonchi, or crackles, normal respiratory effort. EXT: no c/c/e Psych: well oriented, neither depressed nor anxious in appearance  A/P: 1. URI. Supportive care reviewed with patient. See patient instruction section.    1. URI (upper respiratory infection)     Orders Today:  No orders of the defined types were placed in this encounter.    Medications Today: (Includes new updates added during medication  reconciliation) Meds ordered this encounter  Medications  . HYDROcodone-homatropine (HYCODAN) 5-1.5 MG/5ML syrup    Sig: 1 tsp po at night before bed prn cough    Dispense:  240 mL    Refill:  0    Medications Discontinued: There are no discontinued medications.

## 2011-12-18 ENCOUNTER — Other Ambulatory Visit: Payer: Self-pay | Admitting: Family Medicine

## 2012-06-02 ENCOUNTER — Other Ambulatory Visit (INDEPENDENT_AMBULATORY_CARE_PROVIDER_SITE_OTHER): Payer: BC Managed Care – PPO

## 2012-06-02 ENCOUNTER — Telehealth: Payer: Self-pay | Admitting: Family Medicine

## 2012-06-02 DIAGNOSIS — M899 Disorder of bone, unspecified: Secondary | ICD-10-CM

## 2012-06-02 DIAGNOSIS — M949 Disorder of cartilage, unspecified: Secondary | ICD-10-CM

## 2012-06-02 DIAGNOSIS — E785 Hyperlipidemia, unspecified: Secondary | ICD-10-CM

## 2012-06-02 LAB — COMPREHENSIVE METABOLIC PANEL
ALT: 25 U/L (ref 0–35)
AST: 25 U/L (ref 0–37)
Alkaline Phosphatase: 78 U/L (ref 39–117)
Calcium: 9.1 mg/dL (ref 8.4–10.5)
Chloride: 103 mEq/L (ref 96–112)
Creatinine, Ser: 0.8 mg/dL (ref 0.4–1.2)
Total Bilirubin: 0.6 mg/dL (ref 0.3–1.2)

## 2012-06-02 LAB — LIPID PANEL
HDL: 42.2 mg/dL (ref >39.00)
Total CHOL/HDL Ratio: 6
Triglycerides: 191 mg/dL — ABNORMAL HIGH (ref 0.0–149.0)
VLDL: 38.2 mg/dL (ref 0.0–40.0)

## 2012-06-02 LAB — LDL CHOLESTEROL, DIRECT: Direct LDL: 164.4 mg/dL

## 2012-06-02 NOTE — Telephone Encounter (Signed)
Message copied by Kerby Nora E on Tue Jun 02, 2012  8:18 AM ------      Message from: Alvina Chou      Created: Mon Jun 01, 2012  3:06 PM      Regarding: lab orders for Tuesday, 4.22.14       Patient is scheduled for CPX labs, please order future labs, Thanks , Terri       ------

## 2012-06-03 LAB — VITAMIN D 25 HYDROXY (VIT D DEFICIENCY, FRACTURES): Vit D, 25-Hydroxy: 32 ng/mL (ref 30–89)

## 2012-06-09 ENCOUNTER — Encounter: Payer: Self-pay | Admitting: Family Medicine

## 2012-06-09 ENCOUNTER — Ambulatory Visit (INDEPENDENT_AMBULATORY_CARE_PROVIDER_SITE_OTHER): Payer: BC Managed Care – PPO | Admitting: Family Medicine

## 2012-06-09 VITALS — BP 120/78 | HR 91 | Temp 98.2°F | Ht 65.0 in | Wt 144.8 lb

## 2012-06-09 DIAGNOSIS — Z0000 Encounter for general adult medical examination without abnormal findings: Secondary | ICD-10-CM

## 2012-06-09 DIAGNOSIS — E785 Hyperlipidemia, unspecified: Secondary | ICD-10-CM

## 2012-06-09 DIAGNOSIS — R002 Palpitations: Secondary | ICD-10-CM

## 2012-06-09 NOTE — Progress Notes (Signed)
57 year old female presents for wellness exam.   In last week she has noted heart racing at times intermittantly. At rest. Feels nervous, anxious. No change in stress. No CP, no SOB. No new OTC meds or prescription meds Drinks caffeine 2 cups in AM and one after lunch.   Elevated Cholesterol: previously well controlled on simvastatin 40 mg daily.  She has not been taking it regularly, only 2-3 days aweek.. So cholesterol is not well controlled. Having less leg cramps.  Lab Results  Component Value Date   CHOL 234* 06/02/2012   HDL 42.20 06/02/2012   LDLCALC 96 02/22/2011   LDLDIRECT 164.4 06/02/2012   TRIG 191.0* 06/02/2012   CHOLHDL 6 06/02/2012  Using medications without problems: maybe  Muscle aches: yes  Diet compliance: Adequate water in diet, eating moderately well  Exercise: None  Other complaints:   Review of Systems  Constitutional: Negative for fever, fatigue and unexpected weight change.  HENT: Negative for ear pain, congestion, sore throat, sneezing, trouble swallowing and sinus pressure.  Eyes: Negative for pain and itching.  Respiratory: Negative for cough, shortness of breath and wheezing.  Cardiovascular: Negative for chest pain, has noted  palpitations and no leg swelling.  Gastrointestinal: Negative for nausea, abdominal pain, diarrhea, constipation and blood in stool.  Genitourinary: Negative for dysuria, hematuria, vaginal discharge and difficulty urinating.  Skin: Negative for rash.  Neurological: Negative for syncope, weakness, light-headedness, numbness and headaches.  Psychiatric/Behavioral: Negative for confusion and dysphoric mood. The patient is not nervous/anxious.  Objective:   Physical Exam  Constitutional: Vital signs are normal. She appears well-developed and well-nourished. She is cooperative. Non-toxic appearance. She does not appear ill. No distress.  HENT:  Head: Normocephalic.  Right Ear: Hearing, tympanic membrane, external ear and ear canal  normal.  Left Ear: Hearing, tympanic membrane, external ear and ear canal normal.  Nose: Nose normal.  Eyes: Conjunctivae, EOM and lids are normal. Pupils are equal, round, and reactive to light. No foreign bodies found.  Neck: Trachea normal and normal range of motion. Neck supple. Carotid bruit is not present. No mass and no thyromegaly present.  Cardiovascular: Normal rate, regular rhythm, S1 normal, S2 normal, normal heart sounds and intact distal pulses. Exam reveals no gallop.  No murmur heard.  Pulmonary/Chest: Effort normal and breath sounds normal. No respiratory distress. She has no wheezes. She has no rhonchi. She has no rales.  Abdominal: Soft. Normal appearance and bowel sounds are normal. She exhibits no distension, no fluid wave, no abdominal bruit and no mass. There is no hepatosplenomegaly. There is no tenderness. There is no rebound, no guarding and no CVA tenderness. No hernia.  Genitourinary: Vagina normal and uterus normal. Rectal exam shows external hemorrhoid. No breast swelling, tenderness, discharge or bleeding. Pelvic exam was performed with patient prone. There is no rash, tenderness or lesion on the right labia. There is no rash, tenderness or lesion on the left labia. Uterus is not enlarged and not tender. NO PAP PERFROMED. Right adnexum displays no mass, no tenderness and no fullness. Left adnexum displays no mass, no tenderness and no fullness.  Ext hemorrhoid tags.  Lymphadenopathy:  She has no cervical adenopathy.  She has no axillary adenopathy.  Neurological: She is alert. She has normal strength. No cranial nerve deficit or sensory deficit.  Skin: Skin is warm, dry and intact. No rash noted.  Psychiatric: Her speech is normal and behavior is normal. Judgment normal. Her mood appears not anxious. Cognition and memory  are normal. She does not exhibit a depressed mood.  Assessment & Plan:   CPX: The patient's preventative maintenance and recommended screening tests  for an annual wellness exam were reviewed in full today.  Brought up to date unless services declined.  Counselled on the importance of diet, exercise, and its role in overall health and mortality.  The patient's FH and SH was reviewed, including their home life, tobacco status, and drug and alcohol status.   Vaccines: uptodate with Td, flu  Mammo: 03/2011 nml, due now.  Colon: nml 04/2010.Marland Kitchen Repeat in 10 years.  PAP/DVE: Every 3 years pap, DVE yearly. DXA: 2013 osteopenia in hip, normal density in spine.  >25 pack year history. Rare cough.  Precontemplative.

## 2012-06-09 NOTE — Patient Instructions (Addendum)
Get back on track with taking cholesterol medication. If leg cramps returns .Marland Kitchen Call to let me know. Call to schedule mammogram on your own. For palpitations.. Decrease caffeine or stop, stop smoking. If continuing frequently .Marland Kitchen Or assocaited  With lightheadedness or any new chest pain...call.

## 2012-06-09 NOTE — Assessment & Plan Note (Signed)
Poor control. Go back to atorvastatin daily.. If leg cramps return .Marland Kitchen Call for change to either red yeast rice or pravastatin.

## 2012-06-09 NOTE — Assessment & Plan Note (Signed)
EKG showed frequent PACs... Work on smoking cessation and caffeine decrease. Consider lab eval if continuing... Cbc and TSH.

## 2012-07-01 ENCOUNTER — Ambulatory Visit: Payer: Self-pay | Admitting: Family Medicine

## 2012-07-02 ENCOUNTER — Encounter: Payer: Self-pay | Admitting: Family Medicine

## 2012-07-03 ENCOUNTER — Telehealth: Payer: Self-pay | Admitting: Family Medicine

## 2012-07-03 ENCOUNTER — Ambulatory Visit: Payer: Self-pay | Admitting: Family Medicine

## 2012-07-03 ENCOUNTER — Encounter: Payer: Self-pay | Admitting: Family Medicine

## 2012-07-03 NOTE — Telephone Encounter (Signed)
Called [facility] and they close at 12 pm on Friday will call on Tuesday since we are closed on monday

## 2012-07-03 NOTE — Telephone Encounter (Signed)
Please call [facility]... Mammogram showed supicious lesion that needs FNA biopsy... Have they notified her and set this up or do we needed to?

## 2012-07-07 ENCOUNTER — Telehealth: Payer: Self-pay | Admitting: Family Medicine

## 2012-07-07 DIAGNOSIS — N6310 Unspecified lump in the right breast, unspecified quadrant: Secondary | ICD-10-CM

## 2012-07-07 NOTE — Telephone Encounter (Signed)
Message copied by Excell Seltzer on Tue Jul 07, 2012  9:30 AM ------      Message from: Consuello Masse      Created: Tue Jul 07, 2012  9:29 AM       Spoke with patient and she would like surgical referral to [facility] ------

## 2012-07-19 ENCOUNTER — Other Ambulatory Visit: Payer: Self-pay | Admitting: Family Medicine

## 2012-07-20 ENCOUNTER — Other Ambulatory Visit: Payer: Self-pay

## 2012-07-20 ENCOUNTER — Ambulatory Visit (INDEPENDENT_AMBULATORY_CARE_PROVIDER_SITE_OTHER): Payer: BC Managed Care – PPO | Admitting: General Surgery

## 2012-07-20 ENCOUNTER — Encounter: Payer: Self-pay | Admitting: General Surgery

## 2012-07-20 VITALS — BP 130/72 | HR 77 | Resp 14 | Ht 64.0 in | Wt 146.0 lb

## 2012-07-20 DIAGNOSIS — N63 Unspecified lump in unspecified breast: Secondary | ICD-10-CM | POA: Insufficient documentation

## 2012-07-20 HISTORY — PX: BREAST CYST ASPIRATION: SHX578

## 2012-07-20 HISTORY — PX: BREAST SURGERY: SHX581

## 2012-07-20 NOTE — Patient Instructions (Addendum)
We will call you with your results.

## 2012-07-20 NOTE — Progress Notes (Signed)
Patient ID: Connie West, female   DOB: 04/17/1955, 57 y.o.   MRN: [id number]  Chief Complaint  Patient presents with  . Other    mammogram    HPI Connie West is a 57 y.o. female here today for evaluation after mammogram and ultrasound done 5/21 & 5/23 at [facility].  Patient does not regularly perform self breast checks but does gets regular mammograms. Breast biopsy right in 2001. Patient denies any breast symptoms. Patient does have a family history of mother with breast cancer.  HPI  Past Medical History  Diagnosis Date  . Hyperlipidemia   . Breast mass     Past Surgical History  Procedure Laterality Date  . Cholecystectomy    . Cesarean section    . Ovary surgery    . Breast surgery      right breast bx    Family History  Problem Relation Age of Onset  . Cancer Mother     breast    Social History History  Substance Use Topics  . Smoking status: Current Every Day Smoker -- 0.75 packs/day for 30 years  . Smokeless tobacco: Never Used  . Alcohol Use: No    Allergies  Allergen Reactions  . Penicillins     REACTION: hives    Current Outpatient Prescriptions  Medication Sig Dispense Refill  . calcium carbonate (OS-CAL) 600 MG TABS Take 600 mg by mouth 2 (two) times daily with a meal.      . cholecalciferol (VITAMIN D) 1000 UNITS tablet Take 1,000 Units by mouth daily.      . fish oil-omega-3 fatty acids 1000 MG capsule Take 2 g by mouth daily.      . simvastatin (ZOCOR) 40 MG tablet TAKE ONE TABLET BY MOUTH EVERY DAY FOR CHOLESTEROL  30 tablet  0   No current facility-administered medications for this visit.    Review of Systems Review of Systems  Constitutional: Negative.   Respiratory: Negative.   Cardiovascular: Negative.     Blood pressure 130/72, pulse 77, resp. rate 14, height 5\' 4"  (1.626 m), weight 146 lb (66.225 kg).  Physical Exam Physical Exam  Constitutional: She is oriented to person, place, and time. She appears well-developed and  well-nourished.  Eyes: Conjunctivae are normal. No scleral icterus.  Neck: Normal range of motion. Neck supple.  Cardiovascular: Normal rate and regular rhythm.   Pulmonary/Chest: Effort normal and breath sounds normal. Right breast exhibits no inverted nipple, no mass, no nipple discharge, no skin change and no tenderness. Left breast exhibits no inverted nipple, no mass, no nipple discharge, no skin change and no tenderness.  Abdominal: Soft. Bowel sounds are normal.  Lymphadenopathy:    She has no cervical adenopathy.    She has no axillary adenopathy.  Neurological: She is alert and oriented to person, place, and time.    Data Reviewed Mammogram reviewed showing a bilobed mass in right breast 11o'clock  Assessment    Right breast mass-possible cystic in nature.  Plan    Advised needle aspiration and or core biopsy as needed. Patient is agreeable.   US done in right breast at 11 o'cl location. The bilobed hypoechoic mass was identified. 1ml 1% xylocaine instilled.  22 g needle employed and entered the mass. Aspiration yielded no fluid but mass resolved. Cytology smears sent. No immediate problems from procedure       [facility] G 07/21/2012, 1:43 PM

## 2012-07-21 ENCOUNTER — Encounter: Payer: Self-pay | Admitting: General Surgery

## 2012-07-23 ENCOUNTER — Other Ambulatory Visit: Payer: Self-pay | Admitting: Family Medicine

## 2012-07-23 LAB — FINE-NEEDLE ASPIRATION

## 2012-07-23 MED ORDER — SIMVASTATIN 40 MG PO TABS: ORAL_TABLET | ORAL | Status: DC

## 2012-07-23 NOTE — Telephone Encounter (Signed)
Rx sent to pharmacy   

## 2012-08-27 NOTE — Telephone Encounter (Signed)
Pt called requesting refill Simvastatin; spoke with Hope at [facility], rx on hold and will prepare for pt to pick up. Pt notified.

## 2012-09-17 ENCOUNTER — Encounter: Payer: Self-pay | Admitting: General Surgery

## 2012-09-17 ENCOUNTER — Ambulatory Visit (INDEPENDENT_AMBULATORY_CARE_PROVIDER_SITE_OTHER): Payer: BC Managed Care – PPO | Admitting: General Surgery

## 2012-09-17 ENCOUNTER — Other Ambulatory Visit: Payer: Self-pay

## 2012-09-17 VITALS — BP 132/80 | HR 78 | Resp 12 | Ht 66.0 in | Wt 147.0 lb

## 2012-09-17 DIAGNOSIS — N63 Unspecified lump in unspecified breast: Secondary | ICD-10-CM

## 2012-09-17 NOTE — Patient Instructions (Addendum)
Continue self breast exams. Call office for any new breast issues or concerns.  The patient has been asked to return to the office in three months for a unilateral right breast diagnostic mammogram. This has been scheduled at the [facility] for 12-21-12 at 2:30 pm. She is aware of date, time, and instructions.

## 2012-09-17 NOTE — Progress Notes (Signed)
Patient ID: Connie West, female   DOB: [birth year], 57 y.o.   MRN: [id number]  Chief Complaint  Patient presents with  . Follow-up    right breast u/s     HPI Connie West is a 57 y.o. female here today for a two month follow up right breast ultrasound.  She had aspiration of a bilobed cystic mass in right breast.No new breast issues. HPI  Past Medical History  Diagnosis Date  . Hyperlipidemia   . Breast mass     Past Surgical History  Procedure Laterality Date  . Cholecystectomy    . Cesarean section    . Ovary surgery    . Breast surgery Right 07-20-12    FNA benign    Family History  Problem Relation Age of Onset  . Cancer Mother     breast    Social History History  Substance Use Topics  . Smoking status: Current Every Day Smoker -- 0.75 packs/day for 30 years  . Smokeless tobacco: Never Used  . Alcohol Use: No    Allergies  Allergen Reactions  . Penicillins     REACTION: hives    Current Outpatient Prescriptions  Medication Sig Dispense Refill  . calcium carbonate (OS-CAL) 600 MG TABS Take 600 mg by mouth 2 (two) times daily with a meal.      . cholecalciferol (VITAMIN D) 1000 UNITS tablet Take 1,000 Units by mouth daily.      . fish oil-omega-3 fatty acids 1000 MG capsule Take 2 g by mouth daily.      . simvastatin (ZOCOR) 40 MG tablet TAKE ONE TABLET BY MOUTH EVERY DAY FOR CHOLESTEROL  30 tablet  6   No current facility-administered medications for this visit.    Review of Systems Review of Systems  Constitutional: Negative.   Respiratory: Negative.   Cardiovascular: Negative.     Blood pressure 132/80, pulse 78, resp. rate 12, height 5\' 6"  (1.676 m), weight 147 lb (66.679 kg).  Physical Exam Physical Exam  Constitutional: She appears well-developed and well-nourished.  Pulmonary/Chest: Right breast exhibits no inverted nipple, no mass, no nipple discharge, no skin change and no tenderness. Left breast exhibits no inverted nipple, no mass, no  nipple discharge, no skin change and no tenderness.  Lymphadenopathy:    She has no axillary adenopathy.  Neurological: She is alert.  Skin: Skin is warm and dry.    Data Reviewed Ultrasound preformed.  Assessment   Right breast area of concern resolved.    Plan      The patient has been asked to return to the office in three months with a unilateral right breast diagnostic mammogram. This has been scheduled at the [facility] for 12-21-12 at 2:30 pm. She is aware of date, time, and instructions.    Office visit follow up to be arranged after mammogram.  Connie West G 09/18/2012, 9:06 AM

## 2012-09-18 ENCOUNTER — Encounter: Payer: Self-pay | Admitting: General Surgery

## 2012-12-17 ENCOUNTER — Other Ambulatory Visit: Payer: Self-pay

## 2012-12-21 ENCOUNTER — Ambulatory Visit: Payer: Self-pay | Admitting: General Surgery

## 2012-12-31 ENCOUNTER — Ambulatory Visit: Payer: BC Managed Care – PPO | Admitting: General Surgery

## 2013-02-17 ENCOUNTER — Encounter: Payer: Self-pay | Admitting: *Deleted

## 2013-06-24 ENCOUNTER — Other Ambulatory Visit: Payer: Self-pay | Admitting: Family Medicine

## 2013-08-17 ENCOUNTER — Telehealth: Payer: Self-pay | Admitting: Family Medicine

## 2013-08-17 ENCOUNTER — Other Ambulatory Visit (INDEPENDENT_AMBULATORY_CARE_PROVIDER_SITE_OTHER): Payer: BC Managed Care – PPO

## 2013-08-17 DIAGNOSIS — E785 Hyperlipidemia, unspecified: Secondary | ICD-10-CM

## 2013-08-17 DIAGNOSIS — M949 Disorder of cartilage, unspecified: Secondary | ICD-10-CM

## 2013-08-17 DIAGNOSIS — M899 Disorder of bone, unspecified: Secondary | ICD-10-CM

## 2013-08-17 LAB — COMPREHENSIVE METABOLIC PANEL
ALT: 35 U/L (ref 0–35)
AST: 33 U/L (ref 0–37)
Albumin: 4.1 g/dL (ref 3.5–5.2)
Alkaline Phosphatase: 88 U/L (ref 39–117)
BUN: 10 mg/dL (ref 6–23)
CALCIUM: 9.5 mg/dL (ref 8.4–10.5)
CHLORIDE: 101 meq/L (ref 96–112)
CO2: 30 meq/L (ref 19–32)
Creatinine, Ser: 0.7 mg/dL (ref 0.4–1.2)
GFR: 85.7 mL/min (ref >60.00)
Glucose, Bld: 108 mg/dL — ABNORMAL HIGH (ref 70–99)
Potassium: 3.8 mEq/L (ref 3.5–5.1)
SODIUM: 139 meq/L (ref 135–145)
TOTAL PROTEIN: 7.7 g/dL (ref 6.0–8.3)
Total Bilirubin: 0.4 mg/dL (ref 0.2–1.2)

## 2013-08-17 LAB — LIPID PANEL
Cholesterol: 193 mg/dL (ref 0–200)
HDL: 44.9 mg/dL (ref >39.00)
LDL Cholesterol: 92 mg/dL (ref 0–99)
NONHDL: 148.1
Total CHOL/HDL Ratio: 4
Triglycerides: 282 mg/dL — ABNORMAL HIGH (ref 0.0–149.0)
VLDL: 56.4 mg/dL — ABNORMAL HIGH (ref 0.0–40.0)

## 2013-08-17 LAB — VITAMIN D 25 HYDROXY (VIT D DEFICIENCY, FRACTURES): VITD: 30.41 ng/mL

## 2013-08-17 NOTE — Telephone Encounter (Signed)
Message copied by Jinny Sanders on Tue Aug 17, 2013  8:32 AM ------      Message from: Ellamae Sia      Created: Tue Aug 10, 2013 10:20 AM      Regarding: Lab orders for Tuesday, 7.7.15       Patient is scheduled for CPX labs, please order future labs, Thanks , Terri       ------

## 2013-08-24 ENCOUNTER — Ambulatory Visit (INDEPENDENT_AMBULATORY_CARE_PROVIDER_SITE_OTHER): Payer: BC Managed Care – PPO | Admitting: Family Medicine

## 2013-08-24 ENCOUNTER — Encounter: Payer: Self-pay | Admitting: Family Medicine

## 2013-08-24 VITALS — BP 110/76 | HR 100 | Temp 98.1°F | Ht 64.72 in | Wt 150.5 lb

## 2013-08-24 DIAGNOSIS — Z0000 Encounter for general adult medical examination without abnormal findings: Secondary | ICD-10-CM

## 2013-08-24 DIAGNOSIS — R7309 Other abnormal glucose: Secondary | ICD-10-CM

## 2013-08-24 DIAGNOSIS — E785 Hyperlipidemia, unspecified: Secondary | ICD-10-CM

## 2013-08-24 DIAGNOSIS — R7303 Prediabetes: Secondary | ICD-10-CM | POA: Insufficient documentation

## 2013-08-24 DIAGNOSIS — J069 Acute upper respiratory infection, unspecified: Secondary | ICD-10-CM | POA: Insufficient documentation

## 2013-08-24 NOTE — Progress Notes (Signed)
Pre visit review using our clinic review tool, if applicable. No additional management support is needed unless otherwise documented below in the visit note. 

## 2013-08-24 NOTE — Assessment & Plan Note (Signed)
With right eye viral conjunctivitits.  Symptomatic care.

## 2013-08-24 NOTE — Progress Notes (Signed)
58 year old female presents for wellness exam.  Sore throat, red eye and discharge from eye in  last24 hours. Right eye irritated, no pain. Cough, productive, congestion x few days. No fever. No sick contacts, grandson had  Pink eye 1 month ago.  Elevated Cholesterol: LDL well controlled on simvastatin 40 mg daily. Trigs are up. Lab Results  Component Value Date   CHOL 193 08/17/2013   HDL 44.90 08/17/2013   LDLCALC 92 08/17/2013   LDLDIRECT 164.4 06/02/2012   TRIG 282.0* 08/17/2013   CHOLHDL 4 08/17/2013  Using medications without problems: maybe  Muscle aches: yes  Diet compliance: Adequate water in diet, eating moderately well  Exercise: None  Other complaints:   Prediabetes: new onset.  Has been craving a lot of sweets lately.   Review of Systems  Constitutional: Negative for fever, fatigue and unexpected weight change.  HENT: Negative for ear pain, congestion, sore throat, sneezing, trouble swallowing and sinus pressure.  Eyes: Negative for pain and itching.  Respiratory: Negative for cough, shortness of breath and wheezing.  Cardiovascular: Negative for chest pain, has noted occ rare palpitations and no leg swelling.  Gastrointestinal: Negative for nausea, abdominal pain, diarrhea, constipation and blood in stool.  Genitourinary: Negative for dysuria, hematuria, vaginal discharge and difficulty urinating.  Skin: Negative for rash.  Neurological: Negative for syncope, weakness, light-headedness, numbness and headaches.  Psychiatric/Behavioral: Negative for confusion and dysphoric mood. The patient is not nervous/anxious.  Objective:   Physical Exam  Constitutional: Vital signs are normal. She appears well-developed and well-nourished. She is cooperative. Non-toxic appearance. She does not appear ill. No distress.  HENT:  Head: Normocephalic.  Nasal congestion B nares. Right Ear: Hearing, tympanic membrane, external ear and ear canal normal.  Left Ear: Hearing, tympanic membrane,  external ear and ear canal normal.  Nose: Nose normal.  Eyes: Conjunctivae red right eye, EOM and lids are normal. Pupils are equal, round, and reactive to light. No foreign bodies found.  Neck: Trachea normal and normal range of motion. Neck supple. Carotid bruit is not present. No mass and no thyromegaly present.  Cardiovascular: Normal rate, regular rhythm, S1 normal, S2 normal, normal heart sounds and intact distal pulses. Exam reveals no gallop.  No murmur heard.  Pulmonary/Chest: Effort normal and breath sounds normal. No respiratory distress. She has no wheezes. She has no rhonchi. She has no rales.  Abdominal: Soft. Normal appearance and bowel sounds are normal. She exhibits no distension, no fluid wave, no abdominal bruit and no mass. There is no hepatosplenomegaly. There is no tenderness. There is no rebound, no guarding and no CVA tenderness. No hernia.  Genitourinary: Vagina normal and uterus normal. Rectal exam shows external hemorrhoid. No breast swelling, tenderness, discharge or bleeding. Pelvic exam was performed with patient prone. There is no rash, tenderness or lesion on the right labia. There is no rash, tenderness or lesion on the left labia. Uterus is not enlarged and not tender. NO PAP PERFORMED. Right adnexum displays no mass, no tenderness and no fullness. Left adnexum displays no mass, no tenderness and no fullness.  Ext hemorrhoid tags.  Lymphadenopathy:  She has no cervical adenopathy.  She has no axillary adenopathy.  Neurological: She is alert. She has normal strength. No cranial nerve deficit or sensory deficit.  Skin: Skin is warm, dry and intact. No rash noted.  Psychiatric: Her speech is normal and behavior is normal. Judgment normal. Her mood appears not anxious. Cognition and memory are normal. She does not  exhibit a depressed mood.  Assessment & Plan:   CPX: The patient's preventative maintenance and recommended screening tests for an annual wellness exam were  reviewed in full today.  Brought up to date unless services declined.  Counselled on the importance of diet, exercise, and its role in overall health and mortality.  The patient's FH and SH was reviewed, including their home life, tobacco status, and drug and alcohol status.   Vaccines: uptodate with Td, flu  Mammo: 12/2012 cyst aspirated.  Colon: nml 04/2010.Marland Kitchen Repeat in 10 years.  PAP/DVE: Every 3 years pap, DVE yearly.  Last pap 02/2011 DXA: 2013 osteopenia in hip, normal density in spine, repeat in 5 years 2018  >25 pack year history. Rare cough. Precontemplative.

## 2013-08-24 NOTE — Patient Instructions (Addendum)
Work on low carbohydrate diet ( increase fiber and protein)  Decrease sweets.  Increase exercise. Call to schedule mammogram screening bilateral as soon as you can.   Hyperglycemia Hyperglycemia occurs when the glucose (sugar) in your blood is too high. Hyperglycemia can happen for many reasons, but it most often happens to people who do not know they have diabetes or are not managing their diabetes properly.  CAUSES  Whether you have diabetes or not, there are other causes of hyperglycemia. Hyperglycemia can occur when you have diabetes, but it can also occur in other situations that you might not be as aware of, such as: Pre-diabetes  This cannot be ignored. Before people develop Type 2 diabetes, they almost always have "pre-diabetes." This is when your blood glucose levels are higher than normal, but not yet high enough to be diagnosed as diabetes. Research has shown that some long-term damage to the body, especially the heart and circulatory system, may already be occurring during pre-diabetes. If you take action to manage your blood glucose when you have pre-diabetes, you may delay or prevent Type 2 diabetes from developing. Stress  If you have diabetes, you may be "diet" controlled or on oral medications or insulin to control your diabetes. However, you may find that your blood glucose is higher than usual in the hospital whether you have diabetes or not. This is often referred to as "stress hyperglycemia." Stress can elevate your blood glucose. This happens because of hormones put out by the body during times of stress. If stress has been the cause of your high blood glucose, it can be followed regularly by your caregiver. That way he/she can make sure your hyperglycemia does not continue to get worse or progress to diabetes. Steroids  Steroids are medications that act on the infection fighting system (immune system) to block inflammation or infection. One side effect can be a rise in blood  glucose. Most people can produce enough extra insulin to allow for this rise, but for those who cannot, steroids make blood glucose levels go even higher. It is not unusual for steroid treatments to "uncover" diabetes that is developing. It is not always possible to determine if the hyperglycemia will go away after the steroids are stopped. A special blood test called an A1c is sometimes done to determine if your blood glucose was elevated before the steroids were started. SYMPTOMS  Thirsty.  Frequent urination.  Dry mouth.  Blurred vision.  Tired or fatigue.  Weakness.  Sleepy.  Tingling in feet or leg. DIAGNOSIS  Diagnosis is made by monitoring blood glucose in one or all of the following ways:  A1c test. This is a chemical found in your blood.  Fingerstick blood glucose monitoring.  Laboratory results. TREATMENT  First, knowing the cause of the hyperglycemia is important before the hyperglycemia can be treated. Treatment may include, but is not be limited to:  Education.  Change or adjustment in medications.  Change or adjustment in meal plan.  Treatment for an illness, infection, etc.  More frequent blood glucose monitoring.  Change in exercise plan.  Decreasing or stopping steroids.  Lifestyle changes. HOME CARE INSTRUCTIONS   Test your blood glucose as directed.  Exercise regularly. Your caregiver will give you instructions about exercise. Pre-diabetes or diabetes which comes on with stress is helped by exercising.  Eat wholesome, balanced meals. Eat often and at regular, fixed times. Your caregiver or nutritionist will give you a meal plan to guide your sugar intake.  Being  at an ideal weight is important. If needed, losing as little as 10 to 15 pounds may help improve blood glucose levels. SEEK MEDICAL CARE IF:   You have questions about medicine, activity, or diet.  You continue to have symptoms (problems such as increased thirst, urination, or weight  gain). SEEK IMMEDIATE MEDICAL CARE IF:   You are vomiting or have diarrhea.  Your breath smells fruity.  You are breathing faster or slower.  You are very sleepy or incoherent.  You have numbness, tingling, or pain in your feet or hands.  You have chest pain.  Your symptoms get worse even though you have been following your caregiver's orders.  If you have any other questions or concerns. Document Released: 07/24/2000 Document Revised: 04/22/2011 Document Reviewed: 05/27/2011 [facility] Patient Information 2015 [place], [place]. This information is not intended to replace advice given to you by your health care provider. Make sure you discuss any questions you have with your health care provider.

## 2013-08-25 ENCOUNTER — Telehealth: Payer: Self-pay | Admitting: Family Medicine

## 2013-08-25 NOTE — Telephone Encounter (Signed)
Relevant patient education assigned to patient using Emmi. ° °

## 2013-08-26 ENCOUNTER — Other Ambulatory Visit: Payer: Self-pay | Admitting: Family Medicine

## 2013-09-27 ENCOUNTER — Ambulatory Visit: Payer: Self-pay | Admitting: Family Medicine

## 2013-09-28 ENCOUNTER — Encounter: Payer: Self-pay | Admitting: Family Medicine

## 2013-11-26 ENCOUNTER — Other Ambulatory Visit: Payer: Self-pay

## 2013-12-13 ENCOUNTER — Encounter: Payer: Self-pay | Admitting: Family Medicine

## 2014-02-22 ENCOUNTER — Ambulatory Visit (INDEPENDENT_AMBULATORY_CARE_PROVIDER_SITE_OTHER): Payer: BLUE CROSS/BLUE SHIELD | Admitting: Family Medicine

## 2014-02-22 ENCOUNTER — Encounter: Payer: Self-pay | Admitting: Family Medicine

## 2014-02-22 VITALS — BP 126/80 | HR 84 | Temp 98.4°F | Ht 64.72 in | Wt 150.5 lb

## 2014-02-22 DIAGNOSIS — M5416 Radiculopathy, lumbar region: Secondary | ICD-10-CM

## 2014-02-22 DIAGNOSIS — M5417 Radiculopathy, lumbosacral region: Secondary | ICD-10-CM

## 2014-02-22 MED ORDER — DICLOFENAC SODIUM 75 MG PO TBEC: 75.0000 mg | DELAYED_RELEASE_TABLET | Freq: Two times a day (BID) | ORAL | Status: DC

## 2014-02-22 MED ORDER — CYCLOBENZAPRINE HCL 10 MG PO TABS: 5.0000 mg | ORAL_TABLET | Freq: Every evening | ORAL | Status: DC | PRN

## 2014-02-22 NOTE — Patient Instructions (Signed)
Start diclofenac twice daily for inflammation and pain. Can use muscle relaxant cyclobenzaprine at night for muscle spasm.   Start heat on low back and gentle stretching.  Follow up in 2 weeks if not improving as expected.

## 2014-02-22 NOTE — Progress Notes (Signed)
Subjective:    Patient ID: Connie West, female    DOB: 1955-02-14, 59 y.o.   MRN: [id number]  Back Pain This is a new problem. The current episode started in the past 7 days (3 days). The problem occurs intermittently. The problem has been gradually worsening since onset. The pain is present in the lumbar spine (left side). The quality of the pain is described as aching. The pain radiates to the left knee. The pain is at a severity of 7/10. The pain is moderate. The pain is worse during the day (worse with moving, better with sitting). The symptoms are aggravated by bending (walking). Stiffness is present all day. Associated symptoms include leg pain and weakness. Pertinent negatives include no abdominal pain, bladder incontinence, bowel incontinence, chest pain, dysuria, fever, numbness, paresthesias, pelvic pain, perianal numbness or weight loss. Risk factors include menopause (has osteopenia). She has tried NSAIDs (600 mg ibuprofen) for the symptoms. The treatment provided mild relief.   No past history of back issues, no back surgeries in past.   Occurred after twisting and flipping over a mattress.  Review of Systems  Constitutional: Negative for fever, weight loss and fatigue.  HENT: Negative for ear pain.   Eyes: Negative for pain.  Respiratory: Negative for chest tightness and shortness of breath.   Cardiovascular: Negative for chest pain, palpitations and leg swelling.  Gastrointestinal: Negative for abdominal pain and bowel incontinence.  Genitourinary: Negative for bladder incontinence, dysuria and pelvic pain.  Musculoskeletal: Positive for back pain.  Neurological: Positive for weakness. Negative for numbness and paresthesias.       Objective:   Physical Exam  Constitutional: Vital signs are normal. She appears well-developed and well-nourished. She is cooperative.  Non-toxic appearance. She does not appear ill. No distress.  HENT:  Head: Normocephalic.  Right Ear:  Hearing, tympanic membrane, external ear and ear canal normal. Tympanic membrane is not erythematous, not retracted and not bulging.  Left Ear: Hearing, tympanic membrane, external ear and ear canal normal. Tympanic membrane is not erythematous, not retracted and not bulging.  Nose: No mucosal edema or rhinorrhea. Right sinus exhibits no maxillary sinus tenderness and no frontal sinus tenderness. Left sinus exhibits no maxillary sinus tenderness and no frontal sinus tenderness.  Mouth/Throat: Uvula is midline, oropharynx is clear and moist and mucous membranes are normal.  Eyes: Conjunctivae, EOM and lids are normal. Pupils are equal, round, and reactive to light. Lids are everted and swept, no foreign bodies found.  Neck: Trachea normal and normal range of motion. Neck supple. Carotid bruit is not present. No thyroid mass and no thyromegaly present.  Cardiovascular: Normal rate, regular rhythm, S1 normal, S2 normal, normal heart sounds, intact distal pulses and normal pulses.  Exam reveals no gallop and no friction rub.   No murmur heard. Pulmonary/Chest: Effort normal and breath sounds normal. No tachypnea. No respiratory distress. She has no decreased breath sounds. She has no wheezes. She has no rhonchi. She has no rales.  Abdominal: Soft. Normal appearance and bowel sounds are normal. There is no tenderness.  Musculoskeletal:       Lumbar back: She exhibits decreased range of motion and tenderness. She exhibits no bony tenderness.  Mildly positive SLR on left  Neurological: She is alert.  Skin: Skin is warm, dry and intact. No rash noted.  Psychiatric: Her speech is normal and behavior is normal. Judgment and thought content normal. Her mood appears not anxious. Cognition and memory are normal. She does not  exhibit a depressed mood.    nml strength and sensation in left lower ext.      Assessment & Plan:

## 2014-02-22 NOTE — Progress Notes (Signed)
Pre visit review using our clinic review tool, if applicable. No additional management support is needed unless otherwise documented below in the visit note. 

## 2014-02-22 NOTE — Assessment & Plan Note (Signed)
NSAIDs, heat, home PT and luscle relaxant.  Follow up if not improving in 2 weeks. Info on stretches given.

## 2014-08-26 ENCOUNTER — Other Ambulatory Visit (INDEPENDENT_AMBULATORY_CARE_PROVIDER_SITE_OTHER): Payer: BLUE CROSS/BLUE SHIELD

## 2014-08-26 ENCOUNTER — Telehealth: Payer: Self-pay | Admitting: Family Medicine

## 2014-08-26 DIAGNOSIS — M8580 Other specified disorders of bone density and structure, unspecified site: Secondary | ICD-10-CM

## 2014-08-26 DIAGNOSIS — E7800 Pure hypercholesterolemia, unspecified: Secondary | ICD-10-CM

## 2014-08-26 LAB — COMPREHENSIVE METABOLIC PANEL
ALT: 19 U/L (ref 0–35)
AST: 19 U/L (ref 0–37)
Albumin: 4.3 g/dL (ref 3.5–5.2)
Alkaline Phosphatase: 85 U/L (ref 39–117)
BILIRUBIN TOTAL: 0.5 mg/dL (ref 0.2–1.2)
BUN: 13 mg/dL (ref 6–23)
CALCIUM: 9.6 mg/dL (ref 8.4–10.5)
CHLORIDE: 103 meq/L (ref 96–112)
CO2: 30 meq/L (ref 19–32)
Creatinine, Ser: 0.9 mg/dL (ref 0.40–1.20)
GFR: 68.13 mL/min (ref >60.00)
GLUCOSE: 93 mg/dL (ref 70–99)
Potassium: 3.9 mEq/L (ref 3.5–5.1)
SODIUM: 140 meq/L (ref 135–145)
TOTAL PROTEIN: 7.3 g/dL (ref 6.0–8.3)

## 2014-08-26 LAB — LIPID PANEL
CHOL/HDL RATIO: 4
CHOLESTEROL: 175 mg/dL (ref 0–200)
HDL: 49.9 mg/dL (ref >39.00)
LDL Cholesterol: 101 mg/dL — ABNORMAL HIGH (ref 0–99)
NONHDL: 125.1
Triglycerides: 123 mg/dL (ref 0.0–149.0)
VLDL: 24.6 mg/dL (ref 0.0–40.0)

## 2014-08-26 LAB — VITAMIN D 25 HYDROXY (VIT D DEFICIENCY, FRACTURES): VITD: 27.4 ng/mL — ABNORMAL LOW (ref 30.00–100.00)

## 2014-08-26 NOTE — Telephone Encounter (Signed)
-----   Message from Ellamae Sia sent at 08/18/2014 11:02 AM EDT ----- Regarding: Lab orders for Friday,7.15.16 Patient is scheduled for CPX labs, please order future labs, Thanks , Karna Christmas

## 2014-09-09 ENCOUNTER — Encounter: Payer: BLUE CROSS/BLUE SHIELD | Admitting: Family Medicine

## 2014-09-11 ENCOUNTER — Other Ambulatory Visit: Payer: Self-pay | Admitting: Family Medicine

## 2014-09-23 ENCOUNTER — Encounter: Payer: Self-pay | Admitting: Family Medicine

## 2014-09-23 ENCOUNTER — Ambulatory Visit (INDEPENDENT_AMBULATORY_CARE_PROVIDER_SITE_OTHER): Payer: BLUE CROSS/BLUE SHIELD | Admitting: Family Medicine

## 2014-09-23 ENCOUNTER — Other Ambulatory Visit (HOSPITAL_COMMUNITY)
Admission: RE | Admit: 2014-09-23 | Discharge: 2014-09-23 | Disposition: A | Discharge status: Home / Self Care | Payer: BLUE CROSS/BLUE SHIELD | Source: Ambulatory Visit | Attending: Family Medicine | Admitting: Family Medicine

## 2014-09-23 VITALS — BP 124/62 | HR 80 | Temp 98.0°F | Ht 65.0 in | Wt 159.5 lb

## 2014-09-23 DIAGNOSIS — M7542 Impingement syndrome of left shoulder: Secondary | ICD-10-CM

## 2014-09-23 DIAGNOSIS — E559 Vitamin D deficiency, unspecified: Secondary | ICD-10-CM

## 2014-09-23 DIAGNOSIS — Z720 Tobacco use: Secondary | ICD-10-CM | POA: Diagnosis not present

## 2014-09-23 DIAGNOSIS — Z01419 Encounter for gynecological examination (general) (routine) without abnormal findings: Secondary | ICD-10-CM | POA: Diagnosis not present

## 2014-09-23 DIAGNOSIS — Z0000 Encounter for general adult medical examination without abnormal findings: Secondary | ICD-10-CM

## 2014-09-23 DIAGNOSIS — E7800 Pure hypercholesterolemia, unspecified: Secondary | ICD-10-CM

## 2014-09-23 DIAGNOSIS — F17200 Nicotine dependence, unspecified, uncomplicated: Secondary | ICD-10-CM

## 2014-09-23 DIAGNOSIS — Z1151 Encounter for screening for human papillomavirus (HPV): Secondary | ICD-10-CM | POA: Diagnosis present

## 2014-09-23 MED ORDER — SIMVASTATIN 40 MG PO TABS: 40.0000 mg | ORAL_TABLET | Freq: Every day | ORAL | Status: DC

## 2014-09-23 MED ORDER — VARENICLINE TARTRATE 1 MG PO TABS
1.0000 mg | ORAL_TABLET | Freq: Two times a day (BID) | ORAL | Status: DC
Start: 2014-09-23 — End: 2014-10-06

## 2014-09-23 MED ORDER — VARENICLINE TARTRATE 0.5 MG X 11 & 1 MG X 42 PO MISC: ORAL | Status: DC

## 2014-09-23 NOTE — Progress Notes (Signed)
The patient is here for annual wellness exam and preventative care.    Left shoulder   Pain ongoing x several months. Pain only when Reaching behind or lifting.  no known injury or fall. Pain located in posterior shoulder at subacromial space.  No weakness, no numbness, no radiation of pain.  No neck pain.  She has not taken any med for pain.  Elevated Cholesterol: LDL well controlled on simvastatin 40 mg daily. Trigs at goal, HDL at goal > 40.. Lab Results  Component Value Date   CHOL 175 08/26/2014   HDL 49.90 08/26/2014   LDLCALC 101* 08/26/2014   LDLDIRECT 164.4 06/02/2012   TRIG 123.0 08/26/2014   CHOLHDL 4 08/26/2014   Using medications without problems: maybe  Muscle aches: yes  Diet compliance: Adequate water in diet, eating moderately well  Exercise: None  Other complaints:  Wt Readings from Last 3 Encounters:  09/23/14 159 lb 8 oz (72.349 kg)  02/22/14 150 lb 8 oz (68.266 kg)  08/24/13 150 lb 8 oz (68.266 kg)     Prediabetes: improved from last year.. No results found for: HGBA1C  Osteopenia: Vit D def at lab check , take 1 ab vit D daily.  Review of Systems  Constitutional: Negative for fever, fatigue and unexpected weight change.  HENT: Negative for ear pain, congestion, sore throat, sneezing, trouble swallowing and sinus pressure.  Eyes: Negative for pain and itching.  Respiratory: Negative for cough, shortness of breath and wheezing.  Cardiovascular: Negative for chest pain, has noted occ rare palpitations and no leg swelling.  Gastrointestinal: Negative for nausea, abdominal pain, diarrhea, constipation and blood in stool.  Genitourinary: Negative for dysuria, hematuria, vaginal discharge and difficulty urinating.  Skin: Negative for rash.  Neurological: Negative for syncope, weakness, light-headedness, numbness and headaches.  Psychiatric/Behavioral: Negative for confusion and dysphoric mood. The patient is not nervous/anxious.  Objective:    Physical Exam  Constitutional: Vital signs are normal. She appears well-developed and well-nourished. She is cooperative. Non-toxic appearance. She does not appear ill. No distress.  HENT:  Head: Normocephalic. Right Ear: Hearing, tympanic membrane, external ear and ear canal normal.  Left Ear: Hearing, tympanic membrane, external ear and ear canal normal.  Nose: Nose normal.  Eyes: Conjunctiva clear EOM and lids are normal. Pupils are equal, round, and reactive to light. No foreign bodies found.  Neck: Trachea normal and normal range of motion. Neck supple. Carotid bruit is not present. No mass and no thyromegaly present.  Cardiovascular: Normal rate, regular rhythm, S1 normal, S2 normal, normal heart sounds and intact distal pulses. Exam reveals no gallop.  No murmur heard.  Pulmonary/Chest: Effort normal and breath sounds normal. No respiratory distress. She has no wheezes. She has no rhonchi. She has no rales.  Abdominal: Soft. Normal appearance and bowel sounds are normal. She exhibits no distension, no fluid wave, no abdominal bruit and no mass. There is no hepatosplenomegaly. There is no tenderness. There is no rebound, no guarding and no CVA tenderness. No hernia.  Genitourinary: Vagina normal and uterus normal.  No breast swelling, tenderness, discharge or bleeding. Pelvic exam was performed with patient prone. There is no rash, tenderness or lesion on the right labia. There is no rash, tenderness or lesion on the left labia. Uterus is not enlarged and not tender. PAP PERFORMED. Right adnexum displays no mass, no tenderness and no fullness. Left adnexum displays no mass, no tenderness and no fullness.  .  Lymphadenopathy:  She has no cervical adenopathy.  She has no axillary adenopathy.  Neurological: She is alert. She has normal strength. No cranial nerve deficit or sensory deficit.  Skin: Skin is warm, dry and intact. No rash noted.  Psychiatric: Her speech is normal and  behavior is normal. Judgment normal. Her mood appears not anxious. Cognition and memory are normal. She does not exhibit a depressed mood.   MSK: left shoulder nontender to palpation, only apin with abduciton and int rotation, neg neers, neg drop arm.    CPX: The patient's preventative maintenance and recommended screening tests for an annual wellness exam were reviewed in full today.  Brought up to date unless services declined.  Counselled on the importance of diet, exercise, and its role in overall health and mortality.  The patient's FH and SH was reviewed, including their home life, tobacco status, and drug and alcohol status.   Vaccines: uptodate with Td. Mammo: 09/2013, due now. Colon: nml 04/2010.Marland Kitchen Repeat in 10 years.  PAP/DVE: Every 3 years pap, DVE yearly. Last pap 02/2011, due this year DXA: 2013 osteopenia in hip, normal density in spine, repeat in 5 years 2018  Current smoker, >25 pack year history. Rare cough. Contemplative.  Discussed in deatil options. Sent in rx for chantix.

## 2014-09-23 NOTE — Patient Instructions (Addendum)
Work on [employer] and regular exercise.  Increase vit D to 400 IU twice daily. Call to schedule mammogram on your own.  Quit smoking, start chantix as discussed. Schedule appt in 1 month for tobacco cessation counseling.

## 2014-09-23 NOTE — Progress Notes (Signed)
Pre visit review using our clinic review tool, if applicable. No additional management support is needed unless otherwise documented below in the visit note. 

## 2014-09-23 NOTE — Addendum Note (Signed)
Addended by: Modena Nunnery on: 09/23/2014 09:22 AM   Modules accepted: Orders

## 2014-09-23 NOTE — Assessment & Plan Note (Signed)
Well controlled. Continue current medication. Encouraged exercise, weight loss, healthy eating habits.  

## 2014-09-23 NOTE — Assessment & Plan Note (Signed)
INcrease vit D to 400 IU twice daily.

## 2014-09-23 NOTE — Assessment & Plan Note (Signed)
Treat with NSAIDs x 3-4 days ( ibuprofen), start home exercises , info given.

## 2014-09-23 NOTE — Assessment & Plan Note (Signed)
Smoking cessation instruction/counseling given:  counseled patient on the dangers of tobacco use, advised patient to stop smoking, and reviewed strategies to maximize success. Spent 10 minutes dicsussing.  Provided rx for chantix. Encouraged follow up.

## 2014-09-26 ENCOUNTER — Other Ambulatory Visit: Payer: Self-pay | Admitting: Family Medicine

## 2014-09-26 DIAGNOSIS — Z1231 Encounter for screening mammogram for malignant neoplasm of breast: Secondary | ICD-10-CM

## 2014-09-27 LAB — CYTOLOGY - PAP

## 2014-09-28 ENCOUNTER — Encounter: Payer: Self-pay | Admitting: *Deleted

## 2014-09-29 ENCOUNTER — Ambulatory Visit
Admission: RE | Admit: 2014-09-29 | Discharge: 2014-09-29 | Disposition: A | Discharge status: Home / Self Care | Payer: BLUE CROSS/BLUE SHIELD | Source: Ambulatory Visit | Attending: Family Medicine | Admitting: Family Medicine

## 2014-09-29 DIAGNOSIS — Z1231 Encounter for screening mammogram for malignant neoplasm of breast: Secondary | ICD-10-CM | POA: Diagnosis present

## 2014-10-06 ENCOUNTER — Other Ambulatory Visit: Payer: Self-pay | Admitting: *Deleted

## 2014-10-06 MED ORDER — VARENICLINE TARTRATE 1 MG PO TABS: 1.0000 mg | ORAL_TABLET | Freq: Two times a day (BID) | ORAL | Status: DC

## 2014-10-06 NOTE — Addendum Note (Signed)
Addended by: Carter Kitten on: 10/06/2014 11:09 AM   Modules accepted: Orders

## 2014-10-14 ENCOUNTER — Other Ambulatory Visit: Payer: Self-pay | Admitting: Family Medicine

## 2014-10-31 ENCOUNTER — Encounter: Payer: Self-pay | Admitting: Family Medicine

## 2014-10-31 MED ORDER — VARENICLINE TARTRATE 0.5 MG X 11 & 1 MG X 42 PO MISC: ORAL | Status: DC

## 2014-10-31 NOTE — Telephone Encounter (Signed)
Prescription printed and placed in Dr. Rometta Emery in box for signature.

## 2014-11-03 ENCOUNTER — Ambulatory Visit: Payer: BLUE CROSS/BLUE SHIELD | Admitting: Family Medicine

## 2015-01-17 ENCOUNTER — Ambulatory Visit (INDEPENDENT_AMBULATORY_CARE_PROVIDER_SITE_OTHER): Payer: BLUE CROSS/BLUE SHIELD | Admitting: Internal Medicine

## 2015-01-17 ENCOUNTER — Encounter: Payer: Self-pay | Admitting: Internal Medicine

## 2015-01-17 VITALS — BP 124/76 | HR 96 | Temp 98.1°F | Wt 154.0 lb

## 2015-01-17 DIAGNOSIS — H6981 Other specified disorders of Eustachian tube, right ear: Secondary | ICD-10-CM

## 2015-01-17 NOTE — Patient Instructions (Signed)

## 2015-01-17 NOTE — Progress Notes (Signed)
Pre visit review using our clinic review tool, if applicable. No additional management support is needed unless otherwise documented below in the visit note. 

## 2015-01-17 NOTE — Progress Notes (Signed)
HPI  Pt presents to the clinic today with c/o right ear fullness, sore throat and cough. This started 2 weeks ago. The cough is nonproductive. She denies hearing loss in the right ear. She denies runny nose or nasal congestion. She denies fever, chills or body ache. She has taken Nyquil with minimal relief. She has no history of seasonal allergies. She has not had sick contacts that she is aware of.  Review of Systems      Past Medical History  Diagnosis Date  . Hyperlipidemia   . Breast mass     Family History  Problem Relation Age of Onset  . Breast cancer Mother 85  . Cancer Mother     breast    Social History   Social History  . Marital Status: Married    Spouse Name: N/A  . Number of Children: 2  . Years of Education: N/A   Occupational History  . medical-ad assistant    Social History Main Topics  . Smoking status: Former Smoker -- 0.00 packs/day for 30 years  . Smokeless tobacco: Never Used     Comment: quit x 1 month 11/16  . Alcohol Use: 0.0 oz/week    0 Standard drinks or equivalent per week     Comment: rarely  . Drug Use: No  . Sexual Activity: Not on file   Other Topics Concern  . Not on file   Social History Narrative   From [place]          Allergies  Allergen Reactions  . Penicillins     REACTION: hives     Constitutional:  Denies headache, fatigue, fever or abrupt weight changes.  HEENT:  Positive ear fullness, sore throat. Denies eye redness, eye pain, pressure behind the eyes, facial pain, nasal congestion, ear pain, ringing in the ears, wax buildup, runny nose or bloody nose. Respiratory: Positive cough. Denies difficulty breathing or shortness of breath.  Cardiovascular: Denies chest pain, chest tightness, palpitations or swelling in the hands or feet.   No other specific complaints in a complete review of systems (except as listed in HPI above).  Objective:   BP 124/76 mmHg  Pulse 96  Temp(Src) 98.1 F (36.7 C) (Oral)  Wt  154 lb (69.854 kg)  SpO2 98% Wt Readings from Last 3 Encounters:  01/17/15 154 lb (69.854 kg)  09/23/14 159 lb 8 oz (72.349 kg)  02/22/14 150 lb 8 oz (68.266 kg)     General: Appears her stated age,  in NAD. HEENT: Head: normal shape and size, no sinus tenderness noted; Eyes: sclera white, no icterus, conjunctiva pink; Ears: Tm's pink but intact intact, normal light reflex, +serous effusion on the right; Throat/Mouth:  Teeth present, mucosa erythematous and moist, no exudate noted, no lesions or ulcerations noted.  Neck: No cervical lymphadenopathy.  Cardiovascular: Normal rate and rhythm. S1,S2 noted.  No murmur, rubs or gallops noted.  Pulmonary/Chest: Normal effort and positive vesicular breath sounds. No respiratory distress. No wheezes, rales or ronchi noted.      Assessment & Plan:   ETD, right:  Get some rest and drink plenty of water Do salt water gargles for the sore throat Flonase BID x  1 week If symptoms persist, consider Pred Taper  RTC as needed or if symptoms persist.

## 2015-01-23 ENCOUNTER — Telehealth: Payer: Self-pay

## 2015-01-23 MED ORDER — PREDNISONE 10 MG (21) PO TBPK: 10.0000 mg | ORAL_TABLET | Freq: Every day | ORAL | Status: DC

## 2015-01-23 NOTE — Telephone Encounter (Signed)
Note reviewed. eRx sent.

## 2015-01-23 NOTE — Telephone Encounter (Signed)
Pt left v/m;pt seen 01/17/15 with fluid on ear; ear is no better and pt request med sent to [facility]. Per 01/17/15 note if symptoms persist consider pred taper.Please advise. Avie Echevaria NP out of office.

## 2015-09-22 ENCOUNTER — Telehealth: Payer: Self-pay | Admitting: Family Medicine

## 2015-09-22 DIAGNOSIS — R7303 Prediabetes: Secondary | ICD-10-CM

## 2015-09-22 DIAGNOSIS — E7800 Pure hypercholesterolemia, unspecified: Secondary | ICD-10-CM

## 2015-09-22 DIAGNOSIS — Z1159 Encounter for screening for other viral diseases: Secondary | ICD-10-CM

## 2015-09-22 DIAGNOSIS — M8580 Other specified disorders of bone density and structure, unspecified site: Secondary | ICD-10-CM

## 2015-09-22 NOTE — Telephone Encounter (Signed)
-----   Message from Marchia Bond sent at 09/22/2015 10:47 AM EDT ----- Regarding: Cpx labs Thurs 8/17, need orders. Thanks :-) Please order  future cpx labs for pt's upcoming lab appt. Thanks Aniceto Boss

## 2015-09-28 ENCOUNTER — Other Ambulatory Visit: Payer: BLUE CROSS/BLUE SHIELD

## 2015-09-29 ENCOUNTER — Other Ambulatory Visit (INDEPENDENT_AMBULATORY_CARE_PROVIDER_SITE_OTHER): Payer: BLUE CROSS/BLUE SHIELD

## 2015-09-29 DIAGNOSIS — Z1159 Encounter for screening for other viral diseases: Secondary | ICD-10-CM | POA: Diagnosis not present

## 2015-09-29 DIAGNOSIS — R7303 Prediabetes: Secondary | ICD-10-CM | POA: Diagnosis not present

## 2015-09-29 DIAGNOSIS — E7800 Pure hypercholesterolemia, unspecified: Secondary | ICD-10-CM | POA: Diagnosis not present

## 2015-09-29 DIAGNOSIS — M8580 Other specified disorders of bone density and structure, unspecified site: Secondary | ICD-10-CM

## 2015-09-29 LAB — COMPREHENSIVE METABOLIC PANEL
ALK PHOS: 71 U/L (ref 39–117)
ALT: 31 U/L (ref 0–35)
AST: 28 U/L (ref 0–37)
Albumin: 4.4 g/dL (ref 3.5–5.2)
BILIRUBIN TOTAL: 0.4 mg/dL (ref 0.2–1.2)
BUN: 17 mg/dL (ref 6–23)
CALCIUM: 9.7 mg/dL (ref 8.4–10.5)
CO2: 30 meq/L (ref 19–32)
CREATININE: 0.88 mg/dL (ref 0.40–1.20)
Chloride: 104 mEq/L (ref 96–112)
GFR: 69.66 mL/min (ref >60.00)
GLUCOSE: 93 mg/dL (ref 70–99)
Potassium: 4 mEq/L (ref 3.5–5.1)
Sodium: 140 mEq/L (ref 135–145)
TOTAL PROTEIN: 7.4 g/dL (ref 6.0–8.3)

## 2015-09-29 LAB — HEMOGLOBIN A1C: Hgb A1c MFr Bld: 5.7 % (ref 4.6–6.5)

## 2015-09-29 LAB — LIPID PANEL
CHOL/HDL RATIO: 3
Cholesterol: 193 mg/dL (ref 0–200)
HDL: 66.9 mg/dL (ref >39.00)
LDL Cholesterol: 101 mg/dL — ABNORMAL HIGH (ref 0–99)
NONHDL: 126.28
TRIGLYCERIDES: 124 mg/dL (ref 0.0–149.0)
VLDL: 24.8 mg/dL (ref 0.0–40.0)

## 2015-09-29 LAB — VITAMIN D 25 HYDROXY (VIT D DEFICIENCY, FRACTURES): VITD: 29.96 ng/mL — AB (ref 30.00–100.00)

## 2015-09-30 LAB — HEPATITIS C ANTIBODY: HCV Ab: NEGATIVE

## 2015-10-05 ENCOUNTER — Ambulatory Visit (INDEPENDENT_AMBULATORY_CARE_PROVIDER_SITE_OTHER): Payer: BLUE CROSS/BLUE SHIELD | Admitting: Family Medicine

## 2015-10-05 ENCOUNTER — Encounter: Payer: Self-pay | Admitting: Family Medicine

## 2015-10-05 VITALS — BP 124/76 | HR 82 | Temp 98.2°F | Ht 64.75 in | Wt 158.2 lb

## 2015-10-05 DIAGNOSIS — R7303 Prediabetes: Secondary | ICD-10-CM

## 2015-10-05 DIAGNOSIS — E559 Vitamin D deficiency, unspecified: Secondary | ICD-10-CM

## 2015-10-05 DIAGNOSIS — Z0000 Encounter for general adult medical examination without abnormal findings: Secondary | ICD-10-CM | POA: Diagnosis not present

## 2015-10-05 DIAGNOSIS — E7800 Pure hypercholesterolemia, unspecified: Secondary | ICD-10-CM | POA: Diagnosis not present

## 2015-10-05 NOTE — Patient Instructions (Addendum)
Can increase vit D to twice daily.  Decrease Carbs in diet or calories.   Increase exercise as able to 3-5 times a week 20-30 min.  Look into shingles vaccines coverage.  Get flu vaccine at work.  Call to schedule mammogram on your own.

## 2015-10-05 NOTE — Progress Notes (Signed)
Pre visit review using our clinic review tool, if applicable. No additional management support is needed unless otherwise documented below in the visit note. 

## 2015-10-05 NOTE — Assessment & Plan Note (Signed)
Increase vit D to twice daily.

## 2015-10-05 NOTE — Progress Notes (Signed)
The patient is here for annual wellness exam and preventative care.     Elevated Cholesterol: LDL well controlled on simvastatin 40 mg daily. Trigs at goal, HDL at goal > 40.. Lab Results  Component Value Date   CHOL 193 09/29/2015   HDL 66.90 09/29/2015   LDLCALC 101 (H) 09/29/2015   LDLDIRECT 164.4 06/02/2012   TRIG 124.0 09/29/2015   CHOLHDL 3 09/29/2015  Using medications without problems: none Muscle aches: no Diet compliance: Adequate water in diet, eating moderately well  Exercise: Walking  2-3 times a week.  Other complaints:  Wt Readings from Last 3 Encounters:  10/05/15 158 lb 4 oz (71.8 kg)  01/17/15 154 lb (69.9 kg)  09/23/14 159 lb 8 oz (72.3 kg)   Prediabetes: Improved from last year..  Lab Results  Component Value Date   HGBA1C 5.7 09/29/2015   Osteopenia: Vit D def at lab check , take 1 tab vit D daily.  Social History /Family History/Past Medical History reviewed and updated if needed.   Review of Systems  Constitutional: Negative for fever, fatigue and unexpected weight change.  HENT: Negative for ear pain, congestion, sore throat, sneezing, trouble swallowing and sinus pressure.  Eyes: Negative for pain and itching.  Respiratory: Negative for cough, shortness of breath and wheezing.  Cardiovascular: Negative for chest pain, has noted occ rare palpitations and no leg swelling.  Gastrointestinal: Negative for nausea, abdominal pain, diarrhea, constipation and blood in stool.  Genitourinary: Negative for dysuria, hematuria, vaginal discharge and difficulty urinating.  Skin: Negative for rash.  Neurological: Negative for syncope, weakness, light-headedness, numbness and headaches.  Psychiatric/Behavioral: Negative for confusion and dysphoric mood. The patient is not nervous/anxious.  Objective:   Physical Exam  Constitutional: Vital signs are normal. She appears well-developed and well-nourished. She is cooperative. Non-toxic appearance.  She does not appear ill. No distress.  HENT:  Head: Normocephalic. Right Ear: Hearing, tympanic membrane, external ear and ear canal normal.  Left Ear: Hearing, tympanic membrane, external ear and ear canal normal.  Nose: Nose normal.  Eyes: Conjunctiva clear EOM and lids are normal. Pupils are equal, round, and reactive to light. No foreign bodies found.  Neck: Trachea normal and normal range of motion. Neck supple. Carotid bruit is not present. No mass and no thyromegaly present.  Cardiovascular: Normal rate, regular rhythm, S1 normal, S2 normal, normal heart sounds and intact distal pulses. Exam reveals no gallop.  No murmur heard.  Pulmonary/Chest: Effort normal and breath sounds normal. No respiratory distress. She has no wheezes. She has no rhonchi. She has no rales.  Abdominal: Soft. Normal appearance and bowel sounds are normal. She exhibits no distension, no fluid wave, no abdominal bruit and no mass. There is no hepatosplenomegaly. There is no tenderness. There is no rebound, no guarding and no CVA tenderness. No hernia.  Genitourinary: Vagina normal and uterus normal.  No breast swelling, tenderness, discharge or bleeding.. There is no rash, tenderness or lesion on the right labia. There is no rash, tenderness or lesion on the left labia. Uterus is not enlarged and not tender. PAP  NOT PERFORMED. Right adnexum displays no mass, no tenderness and no fullness. Left adnexum displays no mass, no tenderness and no fullness.  Lymphadenopathy:  She has no cervical adenopathy.  She has no axillary adenopathy.  Neurological: She is alert. She has normal strength. No cranial nerve deficit or sensory deficit.  Skin: Skin is warm, dry and intact. No rash noted.  Psychiatric: Her speech is normal  and behavior is normal. Judgment normal. Her mood appears not anxious. Cognition and memory are normal. She does not exhibit a depressed mood.   MSK: left shoulder nontender to palpation, only  apin with abduciton and int rotation, neg neers, neg drop arm.   CPX: The patient's preventative maintenance and recommended screening tests for an annual wellness exam were reviewed in full today.  Brought up to date unless services declined.  Counselled on the importance of diet, exercise, and its role in overall health and mortality.  The patient's FH and SH was reviewed, including their home life, tobacco status, and drug and alcohol status.   Body mass index is 26.54 kg/m.  Vaccines: uptodate with Td. Look into shingles vaccines coverage.  Get flu vaccine at work. Mammo: 09/2014, due now. Mother with yearly breast cancer. Colon: nml 04/2010.Marland Kitchen Repeat in 10 years.  PAP/DVE: Every 5 years pap, DVE yearly. Last pap 02/2014 neg HPV, neg DXA: 2013 osteopenia in hip, normal density in spine, repeat in 5 years 2018  Former smoker, >25 pack year history. Rare cough.  Hep C: neg.

## 2015-10-05 NOTE — Assessment & Plan Note (Signed)
Improved. Encouraged exercise, weight loss, healthy eating habits.  

## 2015-10-05 NOTE — Assessment & Plan Note (Signed)
Well controlled. Continue current medication.  

## 2015-10-17 ENCOUNTER — Other Ambulatory Visit: Payer: Self-pay | Admitting: Family Medicine

## 2015-10-19 ENCOUNTER — Ambulatory Visit (INDEPENDENT_AMBULATORY_CARE_PROVIDER_SITE_OTHER): Payer: BLUE CROSS/BLUE SHIELD | Admitting: *Deleted

## 2015-10-19 DIAGNOSIS — Z23 Encounter for immunization: Secondary | ICD-10-CM

## 2015-10-25 ENCOUNTER — Other Ambulatory Visit: Payer: Self-pay | Admitting: Family Medicine

## 2015-10-25 DIAGNOSIS — Z1231 Encounter for screening mammogram for malignant neoplasm of breast: Secondary | ICD-10-CM

## 2015-11-13 ENCOUNTER — Other Ambulatory Visit: Payer: Self-pay | Admitting: Family Medicine

## 2015-11-13 ENCOUNTER — Ambulatory Visit
Admission: RE | Admit: 2015-11-13 | Discharge: 2015-11-13 | Disposition: A | Discharge status: Home / Self Care | Payer: BLUE CROSS/BLUE SHIELD | Source: Ambulatory Visit | Attending: Family Medicine | Admitting: Family Medicine

## 2015-11-13 DIAGNOSIS — Z1231 Encounter for screening mammogram for malignant neoplasm of breast: Secondary | ICD-10-CM | POA: Insufficient documentation

## 2015-12-08 DIAGNOSIS — Z23 Encounter for immunization: Secondary | ICD-10-CM | POA: Diagnosis not present

## 2016-03-12 DIAGNOSIS — L821 Other seborrheic keratosis: Secondary | ICD-10-CM | POA: Diagnosis not present

## 2016-03-12 DIAGNOSIS — D225 Melanocytic nevi of trunk: Secondary | ICD-10-CM | POA: Diagnosis not present

## 2016-03-12 DIAGNOSIS — D229 Melanocytic nevi, unspecified: Secondary | ICD-10-CM | POA: Diagnosis not present

## 2016-03-12 DIAGNOSIS — L820 Inflamed seborrheic keratosis: Secondary | ICD-10-CM | POA: Diagnosis not present

## 2016-03-12 DIAGNOSIS — L919 Hypertrophic disorder of the skin, unspecified: Secondary | ICD-10-CM | POA: Diagnosis not present

## 2016-03-12 DIAGNOSIS — D224 Melanocytic nevi of scalp and neck: Secondary | ICD-10-CM | POA: Diagnosis not present

## 2016-03-12 DIAGNOSIS — D485 Neoplasm of uncertain behavior of skin: Secondary | ICD-10-CM | POA: Diagnosis not present

## 2016-03-12 DIAGNOSIS — I788 Other diseases of capillaries: Secondary | ICD-10-CM | POA: Diagnosis not present

## 2016-09-24 ENCOUNTER — Other Ambulatory Visit (INDEPENDENT_AMBULATORY_CARE_PROVIDER_SITE_OTHER): Payer: BLUE CROSS/BLUE SHIELD

## 2016-09-24 ENCOUNTER — Telehealth: Payer: Self-pay | Admitting: Family Medicine

## 2016-09-24 DIAGNOSIS — E559 Vitamin D deficiency, unspecified: Secondary | ICD-10-CM

## 2016-09-24 DIAGNOSIS — M8580 Other specified disorders of bone density and structure, unspecified site: Secondary | ICD-10-CM

## 2016-09-24 DIAGNOSIS — R7303 Prediabetes: Secondary | ICD-10-CM | POA: Diagnosis not present

## 2016-09-24 DIAGNOSIS — E7800 Pure hypercholesterolemia, unspecified: Secondary | ICD-10-CM

## 2016-09-24 LAB — LIPID PANEL
CHOL/HDL RATIO: 3
Cholesterol: 143 mg/dL (ref 0–200)
HDL: 51.9 mg/dL (ref >39.00)
LDL CALC: 70 mg/dL (ref 0–99)
NONHDL: 90.84
Triglycerides: 103 mg/dL (ref 0.0–149.0)
VLDL: 20.6 mg/dL (ref 0.0–40.0)

## 2016-09-24 LAB — COMPREHENSIVE METABOLIC PANEL
ALT: 37 U/L — AB (ref 0–35)
AST: 29 U/L (ref 0–37)
Albumin: 4.7 g/dL (ref 3.5–5.2)
Alkaline Phosphatase: 76 U/L (ref 39–117)
BILIRUBIN TOTAL: 0.5 mg/dL (ref 0.2–1.2)
BUN: 13 mg/dL (ref 6–23)
CALCIUM: 9.8 mg/dL (ref 8.4–10.5)
CHLORIDE: 103 meq/L (ref 96–112)
CO2: 30 meq/L (ref 19–32)
CREATININE: 0.84 mg/dL (ref 0.40–1.20)
GFR: 73.26 mL/min (ref >60.00)
GLUCOSE: 93 mg/dL (ref 70–99)
Potassium: 3.9 mEq/L (ref 3.5–5.1)
Sodium: 140 mEq/L (ref 135–145)
Total Protein: 7.2 g/dL (ref 6.0–8.3)

## 2016-09-24 LAB — HEMOGLOBIN A1C: Hgb A1c MFr Bld: 5.6 % (ref 4.6–6.5)

## 2016-09-24 LAB — VITAMIN D 25 HYDROXY (VIT D DEFICIENCY, FRACTURES): VITD: 29.4 ng/mL — ABNORMAL LOW (ref 30.00–100.00)

## 2016-09-24 NOTE — Telephone Encounter (Signed)
-----   Message from Ellamae Sia sent at 09/19/2016 10:17 AM EDT ----- Regarding: Lab orders for Tuesday, 8.14.18 Patient is scheduled for CPX labs, please order future labs, Thanks , Karna Christmas

## 2016-10-08 ENCOUNTER — Ambulatory Visit (INDEPENDENT_AMBULATORY_CARE_PROVIDER_SITE_OTHER): Payer: BLUE CROSS/BLUE SHIELD | Admitting: Family Medicine

## 2016-10-08 ENCOUNTER — Encounter: Payer: Self-pay | Admitting: Family Medicine

## 2016-10-08 VITALS — BP 102/69 | HR 81 | Temp 98.5°F | Ht 64.5 in | Wt 145.5 lb

## 2016-10-08 DIAGNOSIS — R7303 Prediabetes: Secondary | ICD-10-CM

## 2016-10-08 DIAGNOSIS — Z0000 Encounter for general adult medical examination without abnormal findings: Secondary | ICD-10-CM | POA: Diagnosis not present

## 2016-10-08 DIAGNOSIS — E7800 Pure hypercholesterolemia, unspecified: Secondary | ICD-10-CM | POA: Diagnosis not present

## 2016-10-08 DIAGNOSIS — M25552 Pain in left hip: Secondary | ICD-10-CM | POA: Insufficient documentation

## 2016-10-08 DIAGNOSIS — Z1231 Encounter for screening mammogram for malignant neoplasm of breast: Secondary | ICD-10-CM

## 2016-10-08 DIAGNOSIS — M8580 Other specified disorders of bone density and structure, unspecified site: Secondary | ICD-10-CM

## 2016-10-08 DIAGNOSIS — E559 Vitamin D deficiency, unspecified: Secondary | ICD-10-CM

## 2016-10-08 MED ORDER — DICLOFENAC SODIUM 75 MG PO TBEC: 75.0000 mg | DELAYED_RELEASE_TABLET | Freq: Two times a day (BID) | ORAL | 0 refills | Status: DC

## 2016-10-08 NOTE — Assessment & Plan Note (Signed)
Excellent control with diet  

## 2016-10-08 NOTE — Assessment & Plan Note (Signed)
Borderline low.. Increase vit D intake

## 2016-10-08 NOTE — Assessment & Plan Note (Signed)
Likely due to OA given decreased range of motion. No sign of lumbar source, no clear bursitis as not ttp laterally.  Treat with NSAIDs.. If not improving in 2 weeks consider X-ray and possible referral for injection.

## 2016-10-08 NOTE — Patient Instructions (Addendum)
Please stop at the front desk to set up referral: BONE DENSITY/MAMMOGRAM  Increase vit D to twice daily.  Start diclofenac twice daily pain and inflammation in left hip. Make sure to take on full stomach, call/stop if any stomach irritation.  Call  For follow up if not improved in 2 weeks... For X-ray etc.

## 2016-10-08 NOTE — Progress Notes (Signed)
Subjective:    Patient ID: Connie West, female    DOB: [birth year], 61 y.o.   MRN: [id number]  HPI  The patient is here for annual wellness exam and preventative care.    She is also having  Left hip pain in last 2-3 months. Pain is lateral. No pain with lying on side.. Notes when sitting for a while. 5-6/10 on pain scale. Feels better after moving some. No radiation of pain to leg.  No weakness, no numbness, no low back pain.  No fall, no injury, no change in activity. Has not treated with anything.  Elevated Cholesterol:  LDL at goal on simvastatin 40 mg daily Lab Results  Component Value Date   CHOL 143 09/24/2016   HDL 51.90 09/24/2016   LDLCALC 70 09/24/2016   LDLDIRECT 164.4 06/02/2012   TRIG 103.0 09/24/2016   CHOLHDL 3 09/24/2016  Using medications without problems: Muscle aches:  Diet compliance: weight wathcers Exercise: walking 4-5 days Other complaints: Body mass index is 24.59 kg/m. Wt Readings from Last 3 Encounters:  10/08/16 145 lb 8 oz (66 kg)  10/05/15 158 lb 4 oz (71.8 kg)  01/17/15 154 lb (69.9 kg)   Prediabetes:  Lab Results  Component Value Date   HGBA1C 5.6 09/24/2016   Osteopenia: Vit D low.. Taking once daily.   Social History /Family History/Past Medical History reviewed in detail and updated in EMR if needed. Blood pressure 102/69, pulse 81, temperature 98.5 F (36.9 C), temperature source Oral, height 5' 4.5" (1.638 m), weight 145 lb 8 oz (66 kg).  Review of Systems  Constitutional: Negative for fatigue and fever.  HENT: Negative for congestion.   Eyes: Negative for pain.  Respiratory: Negative for cough and shortness of breath.   Cardiovascular: Negative for chest pain, palpitations and leg swelling.  Gastrointestinal: Negative for abdominal pain.  Genitourinary: Negative for dysuria and vaginal bleeding.  Musculoskeletal: Negative for back pain.  Neurological: Negative for syncope, light-headedness and headaches.    Psychiatric/Behavioral: Negative for dysphoric mood.       Objective:   Physical Exam  Constitutional: Vital signs are normal. She appears well-developed and well-nourished. She is cooperative.  Non-toxic appearance. She does not appear ill. No distress.  HENT:  Head: Normocephalic.  Right Ear: Hearing, tympanic membrane, external ear and ear canal normal.  Left Ear: Hearing, tympanic membrane, external ear and ear canal normal.  Nose: Nose normal.  Eyes: Pupils are equal, round, and reactive to light. Conjunctivae, EOM and lids are normal. Lids are everted and swept, no foreign bodies found.  Neck: Trachea normal and normal range of motion. Neck supple. Carotid bruit is not present. No thyroid mass and no thyromegaly present.  Cardiovascular: Normal rate, regular rhythm, S1 normal, S2 normal, normal heart sounds and intact distal pulses.  Exam reveals no gallop.   No murmur heard. Pulmonary/Chest: Effort normal and breath sounds normal. No respiratory distress. She has no wheezes. She has no rhonchi. She has no rales.  Abdominal: Soft. Normal appearance and bowel sounds are normal. She exhibits no distension, no fluid wave, no abdominal bruit and no mass. There is no hepatosplenomegaly. There is no tenderness. There is no rebound, no guarding and no CVA tenderness. No hernia.  Musculoskeletal:       Left hip: She exhibits decreased range of motion. She exhibits normal strength and no tenderness.       Lumbar back: Normal. She exhibits normal range of motion, no tenderness and no bony tenderness.  Significant decrease in motion of left hip  Lymphadenopathy:    She has no cervical adenopathy.    She has no axillary adenopathy.  Neurological: She is alert. She has normal strength. No cranial nerve deficit or sensory deficit.  Skin: Skin is warm, dry and intact. No rash noted.  Psychiatric: Her speech is normal and behavior is normal. Judgment normal. Her mood appears not anxious. Cognition  and memory are normal. She does not exhibit a depressed mood.       Assessment & Plan:  The patient's preventative maintenance and recommended screening tests for an annual wellness exam were reviewed in full today. Brought up to date unless services declined.  Counselled on the importance of diet, exercise, and its role in overall health and mortality. The patient's FH and SH was reviewed, including their home life, tobacco status, and drug and alcohol status.   Vaccines: uptodate with Td, shingles vaccines   Get flu vaccine at work. Mammo: 10/2017nml Mother with yearly breast cancer. Colon: nml 04/2010.Marland Kitchen Repeat in 10 years.  PAP/DVE: Every 5 years pap, DVE yearly. Last pap 02/2014 neg HPV, neg DXA: 2013 osteopenia in hip, normal density in spine, repeat in 5 years 2018  Former smoker, >25 pack year history.  Quit 2016  Hep C: neg.  STD screen/HIV: refused

## 2016-10-08 NOTE — Assessment & Plan Note (Signed)
Excellent control with weight loss and diet.

## 2016-10-08 NOTE — Assessment & Plan Note (Signed)
Due for bone density. Continue Vit D.

## 2016-10-20 ENCOUNTER — Other Ambulatory Visit: Payer: Self-pay | Admitting: Family Medicine

## 2016-11-18 ENCOUNTER — Ambulatory Visit
Admission: RE | Admit: 2016-11-18 | Discharge: 2016-11-18 | Disposition: A | Discharge status: Home / Self Care | Payer: BLUE CROSS/BLUE SHIELD | Source: Ambulatory Visit | Attending: Family Medicine | Admitting: Family Medicine

## 2016-11-18 DIAGNOSIS — M8589 Other specified disorders of bone density and structure, multiple sites: Secondary | ICD-10-CM | POA: Diagnosis not present

## 2016-11-18 DIAGNOSIS — Z780 Asymptomatic menopausal state: Secondary | ICD-10-CM | POA: Diagnosis not present

## 2016-11-18 DIAGNOSIS — Z1231 Encounter for screening mammogram for malignant neoplasm of breast: Secondary | ICD-10-CM

## 2016-11-18 DIAGNOSIS — M8580 Other specified disorders of bone density and structure, unspecified site: Secondary | ICD-10-CM

## 2016-11-19 ENCOUNTER — Other Ambulatory Visit: Payer: Self-pay | Admitting: Family Medicine

## 2016-11-19 DIAGNOSIS — N6310 Unspecified lump in the right breast, unspecified quadrant: Secondary | ICD-10-CM

## 2016-11-19 DIAGNOSIS — R928 Other abnormal and inconclusive findings on diagnostic imaging of breast: Secondary | ICD-10-CM

## 2016-11-25 ENCOUNTER — Ambulatory Visit
Admission: RE | Admit: 2016-11-25 | Discharge: 2016-11-25 | Disposition: A | Discharge status: Home / Self Care | Payer: BLUE CROSS/BLUE SHIELD | Source: Ambulatory Visit | Attending: Family Medicine | Admitting: Family Medicine

## 2016-11-25 DIAGNOSIS — N6310 Unspecified lump in the right breast, unspecified quadrant: Secondary | ICD-10-CM | POA: Diagnosis present

## 2016-11-25 DIAGNOSIS — N6312 Unspecified lump in the right breast, upper inner quadrant: Secondary | ICD-10-CM | POA: Insufficient documentation

## 2016-11-25 DIAGNOSIS — R928 Other abnormal and inconclusive findings on diagnostic imaging of breast: Secondary | ICD-10-CM

## 2016-12-06 DIAGNOSIS — Z23 Encounter for immunization: Secondary | ICD-10-CM | POA: Diagnosis not present

## 2017-01-21 ENCOUNTER — Ambulatory Visit: Payer: Self-pay

## 2017-01-21 DIAGNOSIS — S7001XA Contusion of right hip, initial encounter: Secondary | ICD-10-CM | POA: Diagnosis not present

## 2017-01-21 DIAGNOSIS — S80211A Abrasion, right knee, initial encounter: Secondary | ICD-10-CM | POA: Diagnosis not present

## 2017-01-21 DIAGNOSIS — W009XXA Unspecified fall due to ice and snow, initial encounter: Secondary | ICD-10-CM | POA: Diagnosis not present

## 2017-01-21 DIAGNOSIS — S59901A Unspecified injury of right elbow, initial encounter: Secondary | ICD-10-CM | POA: Diagnosis not present

## 2017-01-21 NOTE — Telephone Encounter (Signed)
Pt fell trying to get into car. No loss of consciousness, no bleeding. Pt states cannot straighten or bend elbow due to pain. Pt advised to go to [facility] or ED. Care advice given.   Reason for Disposition . Can't bend injured elbow at all  Answer Assessment - Initial Assessment Questions 1. MECHANISM: "How did the injury happen?"     Slipped and fell on ice 2. ONSET: "When did the injury happen?" (Minutes or hours ago)      this am 0800 3. LOCATION: "What part of the elbow is the injured?"      The "actual" elbow the entire elbow 4. APPEARANCE of INJURY: "What does the injury look like?"      Bruising not bleeding  5. SEVERITY: "Can you use the elbow normally?"  "Can you bend it and straighten it fully?"     Cannot use it normally Unable to bend or straighten it fully 6. SIZE: For cuts, bruises, or swelling, ask: "How large is it?" (e.g., inches or centimeters; entire joint)     2 inch bruise 7. PAIN: "Is there pain?" If so, ask: "How bad is the pain?"    (Scale 1-10; or mild, moderate, severe)     Yes 7/10 8. TETANUS: For any breaks in the skin, ask: "When was the last tetanus booster?"     No  9. OTHER SYMPTOMS: "Do you have any other symptoms?"  (e.g., numbness in hand)     No numbness  10. PREGNANCY: "Is there any chance you are pregnant?" "When was your last menstrual period?"       n/a  Protocols used: ELBOW INJURY-A-AH

## 2017-01-22 NOTE — Telephone Encounter (Signed)
I was unable to reach pt by phone on all contact #s.

## 2017-04-30 ENCOUNTER — Telehealth: Payer: Self-pay | Admitting: Family Medicine

## 2017-04-30 DIAGNOSIS — N6310 Unspecified lump in the right breast, unspecified quadrant: Secondary | ICD-10-CM

## 2017-04-30 NOTE — Telephone Encounter (Signed)
Spoke with [facility] pt needs img [id number] mammogram img [id number] ultra sound For right breast mass follow up  Thanks

## 2017-04-30 NOTE — Telephone Encounter (Signed)
Copied from [place] [id number]. Topic: Quick Communication - See Telephone Encounter >> Apr 30, 2017  9:53 AM Ahmed Prima L wrote: CRM for notification. See Telephone encounter for:   04/30/17.  Patient said she needs a f/u mammo & [facility] advised her that her PCP needed to put in an order. Please advise.  [phone]

## 2017-04-30 NOTE — Telephone Encounter (Signed)
Orders entered.  Dr. Diona Browner just needs to co-sign orders.

## 2017-05-01 ENCOUNTER — Telehealth: Payer: Self-pay | Admitting: Family Medicine

## 2017-05-01 DIAGNOSIS — N6310 Unspecified lump in the right breast, unspecified quadrant: Secondary | ICD-10-CM

## 2017-05-01 NOTE — Telephone Encounter (Signed)
-----   Message from Kathaleen Bury, [place] sent at 04/30/2017  9:51 AM EDT ----- Patient needs Diagnostic Uni Right Mammogram(IMG 5534) and Right Breast Ultrasound(IMG 5532) for Right Breast Mass Follow-up.   Thanks,  Melissa @ [facility]

## 2017-05-02 ENCOUNTER — Ambulatory Visit: Payer: BLUE CROSS/BLUE SHIELD | Admitting: Family Medicine

## 2017-05-02 ENCOUNTER — Encounter: Payer: Self-pay | Admitting: Family Medicine

## 2017-05-02 DIAGNOSIS — M79672 Pain in left foot: Secondary | ICD-10-CM | POA: Diagnosis not present

## 2017-05-02 MED ORDER — DICLOFENAC SODIUM 75 MG PO TBEC: 75.0000 mg | DELAYED_RELEASE_TABLET | Freq: Two times a day (BID) | ORAL | 0 refills | Status: DC

## 2017-05-02 NOTE — Patient Instructions (Addendum)
Ice heel and foot, elevate foot.  Start diclofenac twice daily x 1-2 weeks.  Start foot stretches.  Can use heel cup.  If not improving in 2 weeks follow up for possible X-ray.

## 2017-05-02 NOTE — Progress Notes (Signed)
   Subjective:    Patient ID: Connie West, female    DOB: [birth year], 62 y.o.   MRN: [id number]  HPI    62 year old female present for new onset left foot pain and  Redness x 24 hours.  She reports  lateral posterior foot pain as well as lateral heel pain. Mild erythema.  Pain greatest with walking.  Woke her up in middle of night last night... Severe pain  No swelling in ankle or foot. No fall, no known injury.   Ibuprofen 400 mg helps with pain.  Woke her up at night.  Pain now much improved.    No hx of gout.   Blood pressure 112/68, pulse 64, temperature 98.4 F (36.9 C), temperature source Oral, height 5' 4.5" (1.638 m), weight 136 lb 8 oz (61.9 kg), SpO2 99 %.  Review of Systems  Constitutional: Negative for fatigue and fever.  HENT: Negative for congestion.   Eyes: Negative for pain.  Respiratory: Negative for cough and shortness of breath.   Cardiovascular: Negative for chest pain, palpitations and leg swelling.  Gastrointestinal: Negative for abdominal pain.  Genitourinary: Negative for dysuria and vaginal bleeding.  Musculoskeletal: Negative for back pain.  Neurological: Negative for syncope, light-headedness and headaches.  Psychiatric/Behavioral: Negative for dysphoric mood.       Objective:   Physical Exam  Constitutional: Vital signs are normal. She appears well-developed and well-nourished. She is cooperative.  Non-toxic appearance. She does not appear ill. No distress.  HENT:  Head: Normocephalic.  Right Ear: Hearing, tympanic membrane, external ear and ear canal normal. Tympanic membrane is not erythematous, not retracted and not bulging.  Left Ear: Hearing, tympanic membrane, external ear and ear canal normal. Tympanic membrane is not erythematous, not retracted and not bulging.  Nose: No mucosal edema or rhinorrhea. Right sinus exhibits no maxillary sinus tenderness and no frontal sinus tenderness. Left sinus exhibits no maxillary sinus tenderness and no  frontal sinus tenderness.  Mouth/Throat: Uvula is midline, oropharynx is clear and moist and mucous membranes are normal.  Eyes: Pupils are equal, round, and reactive to light. Conjunctivae, EOM and lids are normal. Lids are everted and swept, no foreign bodies found.  Neck: Trachea normal and normal range of motion. Neck supple. Carotid bruit is not present. No thyroid mass and no thyromegaly present.  Cardiovascular: Normal rate, regular rhythm, S1 normal, S2 normal, normal heart sounds, intact distal pulses and normal pulses. Exam reveals no gallop and no friction rub.  No murmur heard. Pulmonary/Chest: Effort normal and breath sounds normal. No tachypnea. No respiratory distress. She has no decreased breath sounds. She has no wheezes. She has no rhonchi. She has no rales.  Abdominal: Soft. Normal appearance and bowel sounds are normal. There is no tenderness.  Musculoskeletal:  ttp over left lateral heel, neg ttp at insertion of plantar fascia, neg heel squeeze, no ttp at achilles tendon, no anterior ofoot pain.  ne heat, slight redness at lateral heel  Neurological: She is alert.  Skin: Skin is warm, dry and intact. No rash noted.  Psychiatric: Her speech is normal and behavior is normal. Judgment and thought content normal. Her mood appears not anxious. Cognition and memory are normal. She does not exhibit a depressed mood.          Assessment & Plan:

## 2017-05-02 NOTE — Assessment & Plan Note (Signed)
Possible plantar fascitis but slightly mpore lateral than insertion.  treat with NSAIDs, ICe and elevation.  Start plantar fascia stretches.  No indication for X-ray.

## 2017-05-16 ENCOUNTER — Ambulatory Visit
Admission: RE | Admit: 2017-05-16 | Discharge: 2017-05-16 | Disposition: A | Discharge status: Home / Self Care | Payer: BLUE CROSS/BLUE SHIELD | Source: Ambulatory Visit | Attending: Family Medicine | Admitting: Family Medicine

## 2017-05-16 DIAGNOSIS — N6312 Unspecified lump in the right breast, upper inner quadrant: Secondary | ICD-10-CM | POA: Diagnosis not present

## 2017-05-16 DIAGNOSIS — N6310 Unspecified lump in the right breast, unspecified quadrant: Secondary | ICD-10-CM

## 2017-05-16 DIAGNOSIS — R928 Other abnormal and inconclusive findings on diagnostic imaging of breast: Secondary | ICD-10-CM | POA: Diagnosis not present

## 2017-10-08 ENCOUNTER — Telehealth: Payer: Self-pay | Admitting: Family Medicine

## 2017-10-08 DIAGNOSIS — E559 Vitamin D deficiency, unspecified: Secondary | ICD-10-CM

## 2017-10-08 DIAGNOSIS — M8580 Other specified disorders of bone density and structure, unspecified site: Secondary | ICD-10-CM

## 2017-10-08 DIAGNOSIS — R7303 Prediabetes: Secondary | ICD-10-CM

## 2017-10-08 DIAGNOSIS — E7800 Pure hypercholesterolemia, unspecified: Secondary | ICD-10-CM

## 2017-10-08 NOTE — Telephone Encounter (Signed)
-----   Message from Ellamae Sia sent at 10/06/2017  3:56 PM EDT ----- Regarding: Lab orders for Tuesday, 10-14-17 Patient is scheduled for CPX labs, please order future labs, Thanks , Karna Christmas

## 2017-10-14 ENCOUNTER — Other Ambulatory Visit (INDEPENDENT_AMBULATORY_CARE_PROVIDER_SITE_OTHER): Payer: BLUE CROSS/BLUE SHIELD

## 2017-10-14 DIAGNOSIS — R7303 Prediabetes: Secondary | ICD-10-CM

## 2017-10-14 DIAGNOSIS — E7800 Pure hypercholesterolemia, unspecified: Secondary | ICD-10-CM | POA: Diagnosis not present

## 2017-10-14 DIAGNOSIS — E559 Vitamin D deficiency, unspecified: Secondary | ICD-10-CM

## 2017-10-14 LAB — COMPREHENSIVE METABOLIC PANEL
ALBUMIN: 4.3 g/dL (ref 3.5–5.2)
ALT: 29 U/L (ref 0–35)
AST: 25 U/L (ref 0–37)
Alkaline Phosphatase: 71 U/L (ref 39–117)
BUN: 13 mg/dL (ref 6–23)
CALCIUM: 9.6 mg/dL (ref 8.4–10.5)
CHLORIDE: 103 meq/L (ref 96–112)
CO2: 28 meq/L (ref 19–32)
CREATININE: 0.93 mg/dL (ref 0.40–1.20)
GFR: 64.91 mL/min (ref >60.00)
Glucose, Bld: 98 mg/dL (ref 70–99)
Potassium: 3.7 mEq/L (ref 3.5–5.1)
Sodium: 139 mEq/L (ref 135–145)
Total Bilirubin: 0.4 mg/dL (ref 0.2–1.2)
Total Protein: 7.3 g/dL (ref 6.0–8.3)

## 2017-10-14 LAB — VITAMIN D 25 HYDROXY (VIT D DEFICIENCY, FRACTURES): VITD: 38.29 ng/mL (ref 30.00–100.00)

## 2017-10-14 LAB — HEMOGLOBIN A1C: HEMOGLOBIN A1C: 5.5 % (ref 4.6–6.5)

## 2017-10-14 LAB — LIPID PANEL
CHOL/HDL RATIO: 3
Cholesterol: 176 mg/dL (ref 0–200)
HDL: 55.1 mg/dL (ref >39.00)
LDL CALC: 96 mg/dL (ref 0–99)
NonHDL: 120.88
Triglycerides: 122 mg/dL (ref 0.0–149.0)
VLDL: 24.4 mg/dL (ref 0.0–40.0)

## 2017-10-17 ENCOUNTER — Encounter: Payer: Self-pay | Admitting: Family Medicine

## 2017-10-17 ENCOUNTER — Ambulatory Visit (INDEPENDENT_AMBULATORY_CARE_PROVIDER_SITE_OTHER): Payer: BLUE CROSS/BLUE SHIELD | Admitting: Family Medicine

## 2017-10-17 VITALS — BP 100/60 | HR 77 | Temp 98.4°F | Ht 64.5 in | Wt 138.5 lb

## 2017-10-17 DIAGNOSIS — R7303 Prediabetes: Secondary | ICD-10-CM

## 2017-10-17 DIAGNOSIS — E7800 Pure hypercholesterolemia, unspecified: Secondary | ICD-10-CM

## 2017-10-17 DIAGNOSIS — E559 Vitamin D deficiency, unspecified: Secondary | ICD-10-CM

## 2017-10-17 DIAGNOSIS — Z0000 Encounter for general adult medical examination without abnormal findings: Secondary | ICD-10-CM

## 2017-10-17 NOTE — Progress Notes (Signed)
Subjective:    Patient ID: Connie West, female    DOB: [birth year], 62 y.o.   MRN: [id number]  HPI  The patient is here for annual wellness exam and preventative care.    Elevated Cholesterol: good control on  Simvastatin daily. Lab Results  Component Value Date   CHOL 176 10/14/2017   HDL 55.10 10/14/2017   LDLCALC 96 10/14/2017   LDLDIRECT 164.4 06/02/2012   TRIG 122.0 10/14/2017   CHOLHDL 3 10/14/2017  Using medications without problems: Muscle aches:  Diet compliance: She is doing weight watchers, no sweets. Exercise: walking Other complaints: Wt Readings from Last 3 Encounters:  10/17/17 138 lb 8 oz (62.8 kg)  05/02/17 136 lb 8 oz (61.9 kg)  10/08/16 145 lb 8 oz (66 kg)   Vit D def: resolved  prediabetes:  Lab Results  Component Value Date   HGBA1C 5.5 10/14/2017    Social History /Family History/Past Medical History reviewed in detail and updated in EMR if needed. Blood pressure 100/60, pulse 77, temperature 98.4 F (36.9 C), temperature source Oral, height 5' 4.5" (1.638 m), weight 138 lb 8 oz (62.8 kg).   Review of Systems  Constitutional: Negative for fatigue and fever.  HENT: Negative for congestion.   Eyes: Negative for pain.  Respiratory: Negative for cough and shortness of breath.   Cardiovascular: Negative for chest pain, palpitations and leg swelling.  Gastrointestinal: Negative for abdominal pain.  Genitourinary: Negative for dysuria and vaginal bleeding.  Musculoskeletal: Negative for back pain.  Neurological: Negative for syncope, light-headedness and headaches.  Psychiatric/Behavioral: Negative for dysphoric mood.       Objective:   Physical Exam  Constitutional: Vital signs are normal. She appears well-developed and well-nourished. She is cooperative.  Non-toxic appearance. She does not appear ill. No distress.  HENT:  Head: Normocephalic.  Right Ear: Hearing, tympanic membrane, external ear and ear canal normal.  Left Ear: Hearing,  tympanic membrane, external ear and ear canal normal.  Nose: Nose normal.  Eyes: Pupils are equal, round, and reactive to light. Conjunctivae, EOM and lids are normal. Lids are everted and swept, no foreign bodies found.  Neck: Trachea normal and normal range of motion. Neck supple. Carotid bruit is not present. No thyroid mass and no thyromegaly present.  Cardiovascular: Normal rate, regular rhythm, S1 normal, S2 normal, normal heart sounds and intact distal pulses. Exam reveals no gallop.  No murmur heard. Pulmonary/Chest: Effort normal and breath sounds normal. No respiratory distress. She has no wheezes. She has no rhonchi. She has no rales.  Abdominal: Soft. Normal appearance and bowel sounds are normal. She exhibits no distension, no fluid wave, no abdominal bruit and no mass. There is no hepatosplenomegaly. There is no tenderness. There is no rebound, no guarding and no CVA tenderness. No hernia.  Lymphadenopathy:    She has no cervical adenopathy.    She has no axillary adenopathy.  Neurological: She is alert. She has normal strength. No cranial nerve deficit or sensory deficit.  Skin: Skin is warm, dry and intact. No rash noted.  Psychiatric: Her speech is normal and behavior is normal. Judgment normal. Her mood appears not anxious. Cognition and memory are normal. She does not exhibit a depressed mood.    Slight discoloration on right great toenail.. follow      Assessment & Plan:  The patient's preventative maintenance and recommended screening tests for an annual wellness exam were reviewed in full today. Brought up to date unless services declined.  Counselled on the importance of diet, exercise, and its role in overall health and mortality. The patient's FH and SH was reviewed, including their home life, tobacco status, and drug and alcohol status.   Vaccines: uptodate with Td, shingles vaccines   Get flu vaccine at work. Mammo: 11/2016 nml Mother with yearly breast  cancer. Colon: nml 04/2010.Marland Kitchen Repeat in 10 years.  PAP/DVE: Every 5years pap, DVE yearly. Last pap 02/2014 neg HPV, neg DXA: 11/2016  Stable osteopenia in hip, normal density in spine, repeat in 5 years  Formersmoker, >25 pack year history.  Quit 2016 Hep C: neg.  STD screen/HIV: refused

## 2017-10-17 NOTE — Assessment & Plan Note (Signed)
now in nml range, resolved.

## 2017-10-17 NOTE — Assessment & Plan Note (Signed)
Resolved

## 2017-10-17 NOTE — Patient Instructions (Signed)
Keep up low cholesterol diet and healthy eating habits. Keep up the exercise 3-5 days a week.

## 2017-10-17 NOTE — Assessment & Plan Note (Signed)
Well controlled. Continue current medication.  

## 2017-10-21 ENCOUNTER — Other Ambulatory Visit: Payer: Self-pay | Admitting: Family Medicine

## 2017-10-30 ENCOUNTER — Other Ambulatory Visit: Payer: Self-pay | Admitting: Family Medicine

## 2017-10-30 DIAGNOSIS — N6310 Unspecified lump in the right breast, unspecified quadrant: Secondary | ICD-10-CM

## 2017-10-30 DIAGNOSIS — R928 Other abnormal and inconclusive findings on diagnostic imaging of breast: Secondary | ICD-10-CM

## 2017-11-04 ENCOUNTER — Other Ambulatory Visit: Payer: Self-pay | Admitting: Family Medicine

## 2017-11-04 DIAGNOSIS — N6310 Unspecified lump in the right breast, unspecified quadrant: Secondary | ICD-10-CM

## 2017-11-04 DIAGNOSIS — R928 Other abnormal and inconclusive findings on diagnostic imaging of breast: Secondary | ICD-10-CM

## 2017-11-04 DIAGNOSIS — Z1231 Encounter for screening mammogram for malignant neoplasm of breast: Secondary | ICD-10-CM

## 2017-11-18 DIAGNOSIS — H5213 Myopia, bilateral: Secondary | ICD-10-CM | POA: Diagnosis not present

## 2017-11-21 ENCOUNTER — Ambulatory Visit
Admission: RE | Admit: 2017-11-21 | Discharge: 2017-11-21 | Disposition: A | Discharge status: Home / Self Care | Payer: BLUE CROSS/BLUE SHIELD | Source: Ambulatory Visit | Attending: Family Medicine | Admitting: Family Medicine

## 2017-11-21 DIAGNOSIS — N6001 Solitary cyst of right breast: Secondary | ICD-10-CM | POA: Diagnosis not present

## 2017-11-21 DIAGNOSIS — Z1231 Encounter for screening mammogram for malignant neoplasm of breast: Secondary | ICD-10-CM

## 2017-11-21 DIAGNOSIS — N6310 Unspecified lump in the right breast, unspecified quadrant: Secondary | ICD-10-CM | POA: Diagnosis not present

## 2017-11-21 DIAGNOSIS — R928 Other abnormal and inconclusive findings on diagnostic imaging of breast: Secondary | ICD-10-CM

## 2017-12-23 DIAGNOSIS — Z23 Encounter for immunization: Secondary | ICD-10-CM | POA: Diagnosis not present

## 2018-01-04 ENCOUNTER — Emergency Department: Payer: BLUE CROSS/BLUE SHIELD

## 2018-01-04 ENCOUNTER — Other Ambulatory Visit: Payer: Self-pay

## 2018-01-04 ENCOUNTER — Emergency Department
Admission: EM | Admit: 2018-01-04 | Discharge: 2018-01-04 | Disposition: A | Discharge status: Home / Self Care | Payer: BLUE CROSS/BLUE SHIELD | Attending: Emergency Medicine | Admitting: Emergency Medicine

## 2018-01-04 DIAGNOSIS — Z87891 Personal history of nicotine dependence: Secondary | ICD-10-CM | POA: Insufficient documentation

## 2018-01-04 DIAGNOSIS — E876 Hypokalemia: Secondary | ICD-10-CM | POA: Diagnosis not present

## 2018-01-04 DIAGNOSIS — R42 Dizziness and giddiness: Secondary | ICD-10-CM

## 2018-01-04 DIAGNOSIS — E860 Dehydration: Secondary | ICD-10-CM

## 2018-01-04 DIAGNOSIS — Z79899 Other long term (current) drug therapy: Secondary | ICD-10-CM | POA: Insufficient documentation

## 2018-01-04 LAB — URINALYSIS, COMPLETE (UACMP) WITH MICROSCOPIC
BACTERIA UA: NONE SEEN
BILIRUBIN URINE: NEGATIVE
Glucose, UA: NEGATIVE mg/dL
HGB URINE DIPSTICK: NEGATIVE
KETONES UR: NEGATIVE mg/dL
LEUKOCYTES UA: NEGATIVE
NITRITE: NEGATIVE
Protein, ur: 100 mg/dL — AB
Specific Gravity, Urine: 1.017 (ref 1.005–1.030)
pH: 8 (ref 5.0–8.0)

## 2018-01-04 LAB — BASIC METABOLIC PANEL
Anion gap: 14 (ref 5–15)
BUN: 10 mg/dL (ref 8–23)
CHLORIDE: 99 mmol/L (ref 98–111)
CO2: 24 mmol/L (ref 22–32)
Calcium: 9.7 mg/dL (ref 8.9–10.3)
Creatinine, Ser: 0.77 mg/dL (ref 0.44–1.00)
GFR calc Af Amer: >60 mL/min (ref >60)
GFR calc non Af Amer: >60 mL/min (ref >60)
Glucose, Bld: 116 mg/dL — ABNORMAL HIGH (ref 70–99)
POTASSIUM: 3.3 mmol/L — AB (ref 3.5–5.1)
SODIUM: 137 mmol/L (ref 135–145)

## 2018-01-04 LAB — CBC
HEMATOCRIT: 45.4 % (ref 36.0–46.0)
Hemoglobin: 15.1 g/dL — ABNORMAL HIGH (ref 12.0–15.0)
MCH: 30 pg (ref 26.0–34.0)
MCHC: 33.3 g/dL (ref 30.0–36.0)
MCV: 90.3 fL (ref 80.0–100.0)
NRBC: 0 % (ref 0.0–0.2)
Platelets: 329 10*3/uL (ref 150–400)
RBC: 5.03 MIL/uL (ref 3.87–5.11)
RDW: 11.9 % (ref 11.5–15.5)
WBC: 6.4 10*3/uL (ref 4.0–10.5)

## 2018-01-04 LAB — TROPONIN I

## 2018-01-04 MED ORDER — SODIUM CHLORIDE 0.9 % IV BOLUS
1000.0000 mL | Freq: Once | INTRAVENOUS | Status: AC
  Administered 2018-01-04: 1000 mL via INTRAVENOUS

## 2018-01-04 MED ORDER — ONDANSETRON HCL 4 MG PO TABS: 4.0000 mg | ORAL_TABLET | Freq: Three times a day (TID) | ORAL | 0 refills | Status: DC | PRN

## 2018-01-04 MED ORDER — POTASSIUM CHLORIDE CRYS ER 20 MEQ PO TBCR
40.0000 meq | EXTENDED_RELEASE_TABLET | Freq: Once | ORAL | Status: AC
  Administered 2018-01-04: 40 meq via ORAL
  Filled 2018-01-04: qty 2

## 2018-01-04 MED ORDER — MECLIZINE HCL 25 MG PO TABS: 25.0000 mg | ORAL_TABLET | Freq: Three times a day (TID) | ORAL | 0 refills | Status: DC | PRN

## 2018-01-04 NOTE — Discharge Instructions (Addendum)
Return to the ER for any worsening condition including altered mental status, fever, one sided weakness or numbness, or any other symptoms concerning to you.

## 2018-01-04 NOTE — ED Notes (Signed)
Pt given meal try OK per Dr. Reita Cliche.

## 2018-01-04 NOTE — ED Provider Notes (Signed)
[facility] Emergency Department Provider Note ____________________________________________   I have reviewed the triage vital signs and the triage nursing note.  HISTORY  Chief Complaint Dizziness   Historian Patient and daughter   HPI Connie West is a 62 y.o. female presents for dizziness  since Friday evening.  Seems a little bit positional.  Some room spinning, but feels different to her than prior vertigo.  Friday she had a little bit of blurry vision but that is better now.  Her vision is normal now.  Decreased appetite and did not have breakfast this morning, but ate normally last night.  States that she does not quite feel like herself and when asked to describe this further it sounds like she feels generalized weakness but no focal weakness or speech problems.  Denies chest pain, headache, or abdominal pain.    Past Medical History:  Diagnosis Date  . Breast mass   . Hyperlipidemia     Patient Active Problem List   Diagnosis Date Noted  . Vitamin D deficiency 09/23/2014  . Prediabetes 08/24/2013  . Palpitations 06/09/2012  . Osteopenia 01/27/2009  . POSTMENOPAUSAL STATUS 01/18/2009  . DIVERTICULOSIS, COLON 05/18/2008  . Pure hypercholesterolemia 12/25/2007    Past Surgical History:  Procedure Laterality Date  . BREAST CYST ASPIRATION Right 07/20/2012   FNA benign  . BREAST CYST ASPIRATION Right   . BREAST SURGERY Right 07-20-12   FNA benign  . CESAREAN SECTION    . CHOLECYSTECTOMY    . OVARY SURGERY      Prior to Admission medications   Medication Sig Start Date End Date Taking? Authorizing Provider  calcium carbonate (OS-CAL) 600 MG TABS Take 600 mg by mouth 2 (two) times daily with a meal.    [provider]  cholecalciferol (VITAMIN D) 1000 UNITS tablet Take 1,000 Units by mouth daily.    [provider]  fish oil-omega-3 fatty acids 1000 MG capsule Take 2 g by mouth daily.    [provider]   ibuprofen (ADVIL,MOTRIN) 200 MG tablet Take 200 mg by mouth every 6 (six) hours as needed.    [provider]  meclizine (ANTIVERT) 25 MG tablet Take 1 tablet (25 mg total) by mouth 3 (three) times daily as needed for dizziness or nausea. 01/04/18   Lisa Roca, MD  ondansetron (ZOFRAN) 4 MG tablet Take 1 tablet (4 mg total) by mouth every 8 (eight) hours as needed for nausea or vomiting. 01/04/18   Lisa Roca, MD  simvastatin (ZOCOR) 40 MG tablet TAKE 1 TABLET EVERY DAY 10/21/17   Jinny Sanders, MD    Allergies  Allergen Reactions  . Penicillins     REACTION: hives    Family History  Problem Relation Age of Onset  . Breast cancer Mother 29  . Cancer Mother        breast    Social History Social History   Tobacco Use  . Smoking status: Former Smoker    Packs/day: 0.00    Years: 30.00    Pack years: 0.00  . Smokeless tobacco: Never Used  . Tobacco comment: quit x 1 month 11/16  Substance Use Topics  . Alcohol use: Yes    Alcohol/week: 0.0 standard drinks    Comment: rarely  . Drug use: No    Review of Systems  Constitutional: Negative for fever. Eyes: Negative for visual changes. ENT: Negative for sore throat. Cardiovascular: Negative for chest pain. Respiratory: Negative for shortness of breath. Gastrointestinal:  Negative for abdominal pain, vomiting and diarrhea. Genitourinary: Negative for dysuria. Musculoskeletal: Negative for back pain. Skin: Negative for rash. Neurological: Negative for headache.  ____________________________________________   PHYSICAL EXAM:  VITAL SIGNS: ED Triage Vitals  Enc Vitals Group     BP --      Pulse --      Resp --      Temp 01/04/18 1043 98.1 F (36.7 C)     Temp Source 01/04/18 1043 Oral     SpO2 --      Weight 01/04/18 1041 135 lb (61.2 kg)     Height 01/04/18 1041 5\' 6"  (1.676 m)     Head Circumference --      Peak Flow --      Pain Score 01/04/18 1049 0     Pain Loc --      Pain Edu? --       Excl. in [place]? --      Constitutional: Alert and oriented.  HEENT      Head: Normocephalic and atraumatic.      Eyes: Conjunctivae are normal. Pupils equal and round.       Ears:         Nose: No congestion/rhinnorhea.      Mouth/Throat: Mucous membranes are moist.      Neck: No stridor. Cardiovascular/Chest: Normal rate, regular rhythm.  No murmurs, rubs, or gallops. Respiratory: Normal respiratory effort without tachypnea nor retractions. Breath sounds are clear and equal bilaterally. No wheezes/rales/rhonchi. Gastrointestinal: Soft. No distention, no guarding, no rebound. Nontender.    Genitourinary/rectal:Deferred Musculoskeletal: Nontender with normal range of motion in all extremities. No joint effusions.  No lower extremity tenderness.  No edema. Neurologic:  No facial droop.  Coordination intact.  Normal speech and language. No gross or focal neurologic deficits are appreciated. 5/5 strength in 4 extremities. Skin:  Skin is warm, dry and intact. No rash noted. Psychiatric: Mood and affect are normal. Speech and behavior are normal. Patient exhibits appropriate insight and judgment.   ____________________________________________  LABS (pertinent positives/negatives) I, Lisa Roca, MD the attending physician have reviewed the labs noted below.  Labs Reviewed  BASIC METABOLIC PANEL - Abnormal; Notable for the following components:      Result Value   Potassium 3.3 (*)    Glucose, Bld 116 (*)    All other components within normal limits  CBC - Abnormal; Notable for the following components:   Hemoglobin 15.1 (*)    All other components within normal limits  URINALYSIS, COMPLETE (UACMP) WITH MICROSCOPIC - Abnormal; Notable for the following components:   Color, Urine YELLOW (*)    APPearance CLOUDY (*)    Protein, ur 100 (*)    All other components within normal limits  TROPONIN I    ____________________________________________    EKG I, Lisa Roca, MD, the  attending physician have personally viewed and interpreted all ECGs.  82bpm.  NSR.  Narrow qrs.  Normal st and t wave. ____________________________________________  RADIOLOGY   Ct head without contrast:  IMPRESSION: 1. No acute intracranial process. 2. Chronic appearing LEFT cerebellar small infarct versus developmental venous anomaly.  Mri brain without: pending __________________________________________  PROCEDURES  Procedure(s) performed: None  Procedures  Critical Care performed: None   ____________________________________________  ED COURSE / ASSESSMENT AND PLAN  Pertinent labs & imaging results that were available during my care of the patient were reviewed by me and considered in my medical decision making (see chart for details).  Overall well appearing, but complains of dizziness.  CT head neg for acute stroke, but given symptoms discussed obtaining MRI to rule out acute stroke.  Labs show mild low potassium.  Given repletion here as well as IV fluids.  Care transferred to Dr. Corky Downs at shift change - dispo pending mri brain.  If reassuring, may be discharged with my prepared discharge instructions.    CONSULTATIONS:  None  Patient / Family / Caregiver informed of clinical course, medical decision-making process, and agree with plan.   I discussed return precautions, follow-up instructions, and discharge instructions with patient and/or family.  Discharge Instructions : Return to the ER for any worsening condition including altered mental status, fever, one sided weakness or numbness, or any other symptoms concerning to you.    ___________________________________________   FINAL CLINICAL IMPRESSION(S) / ED DIAGNOSES   Final diagnoses:  Hypokalemia  Dizziness  Dehydration  Vertigo      ___________________________________________         Note: This dictation was prepared with Dragon dictation. Any transcriptional errors that result  from this process are unintentional    Lisa Roca, MD 01/04/18 1551

## 2018-01-04 NOTE — ED Triage Notes (Addendum)
Pt comes from home after being dizzy and weak since Friday. She was eating dinner when she started feeling "not in control of her body". She says since then she has "not felt herself". She denies pain or any falls. AOx4. Daughter with her.

## 2018-01-20 ENCOUNTER — Ambulatory Visit: Payer: BLUE CROSS/BLUE SHIELD | Admitting: Family Medicine

## 2018-01-20 ENCOUNTER — Encounter: Payer: Self-pay | Admitting: Family Medicine

## 2018-01-20 VITALS — BP 120/86 | HR 83 | Temp 98.3°F | Ht 64.5 in | Wt 136.2 lb

## 2018-01-20 DIAGNOSIS — E7800 Pure hypercholesterolemia, unspecified: Secondary | ICD-10-CM | POA: Diagnosis not present

## 2018-01-20 DIAGNOSIS — R42 Dizziness and giddiness: Secondary | ICD-10-CM | POA: Diagnosis not present

## 2018-01-20 DIAGNOSIS — R2689 Other abnormalities of gait and mobility: Secondary | ICD-10-CM

## 2018-01-20 DIAGNOSIS — Z8673 Personal history of transient ischemic attack (TIA), and cerebral infarction without residual deficits: Secondary | ICD-10-CM

## 2018-01-20 NOTE — Patient Instructions (Signed)
Start baby aspirin 81 mg.  Please stop at the lab to have labs drawn.. Please stop at the front desk to set up referral.

## 2018-01-20 NOTE — Progress Notes (Signed)
Subjective:    Patient ID: Connie West, female    DOB: 1955/06/29, 62 y.o.   MRN: [id number]  HPI  62 year old female presents for ER follow up for vertigo.  01/04/2018  Labs unremarkable except low potassium  neg troponin, EKG NSR, narrow QRS, nml St and t wave  UA clear   head CT: Ct head without contrast:  IMPRESSION: 1. No acute intracranial process. 2. Chronic appearing LEFT cerebellar small infarct versus developmental venous anomaly.  MRI brain: IMPRESSION: 1. No acute intracranial process. 2. Old small LEFT cerebellar infarct. Otherwise normal noncontrast MRI head for age.  Not on aspirin. On simvastatin LDL not at goal on last check with new goal < 70.  She reports  since ER still  Hearing buzzing in head, not ringing in ears but head feels like it is buzzing  She feels tired.. She continues to feel off balance, unsteady on feet. Ongoing now in last 2.5 weeks. No room spinning. No change in dizziness with moving head.  no further presyncopal issue.  No headache.   Has cramps off and on.  Blood pressure 120/86, pulse 83, temperature 98.3 F (36.8 C), temperature source Oral, height 5' 4.5" (1.638 m), weight 136 lb 4 oz (61.8 kg). Social History /Family History/Past Medical History reviewed in detail and updated in EMR if needed.  Review of Systems  Constitutional: Negative for fatigue and fever.  HENT: Negative for congestion.   Eyes: Negative for pain.  Respiratory: Negative for cough and shortness of breath.   Cardiovascular: Negative for chest pain, palpitations and leg swelling.  Gastrointestinal: Negative for abdominal pain.  Genitourinary: Negative for dysuria and vaginal bleeding.  Musculoskeletal: Negative for back pain.  Neurological: Positive for weakness and light-headedness. Negative for syncope and headaches.  Psychiatric/Behavioral: Negative for dysphoric mood.       Objective:   Physical Exam  Constitutional: She is oriented to person,  place, and time. Vital signs are normal. She appears well-developed and well-nourished. She is cooperative.  Non-toxic appearance. She does not appear ill. No distress.  HENT:  Head: Normocephalic.  Right Ear: Hearing, tympanic membrane, external ear and ear canal normal. Tympanic membrane is not erythematous, not retracted and not bulging.  Left Ear: Hearing, tympanic membrane, external ear and ear canal normal. Tympanic membrane is not erythematous, not retracted and not bulging.  Nose: No mucosal edema or rhinorrhea. Right sinus exhibits no maxillary sinus tenderness and no frontal sinus tenderness. Left sinus exhibits no maxillary sinus tenderness and no frontal sinus tenderness.  Mouth/Throat: Uvula is midline, oropharynx is clear and moist and mucous membranes are normal.  Eyes: Pupils are equal, round, and reactive to light. Conjunctivae, EOM and lids are normal. Lids are everted and swept, no foreign bodies found.  Neck: Trachea normal and normal range of motion. Neck supple. Carotid bruit is not present. No thyroid mass and no thyromegaly present.  Cardiovascular: Normal rate, regular rhythm, S1 normal, S2 normal, normal heart sounds, intact distal pulses and normal pulses. Exam reveals no gallop and no friction rub.  No murmur heard. Pulmonary/Chest: Effort normal and breath sounds normal. No tachypnea. No respiratory distress. She has no decreased breath sounds. She has no wheezes. She has no rhonchi. She has no rales.  Abdominal: Soft. Normal appearance and bowel sounds are normal. There is no tenderness.  Neurological: She is alert and oriented to person, place, and time. She has normal strength and normal reflexes. No cranial nerve deficit or sensory  deficit. She exhibits normal muscle tone. She displays a negative Romberg sign. Coordination and gait normal. GCS eye subscore is 4. GCS verbal subscore is 5. GCS motor subscore is 6.  Nml cerebellar exam   No papilledema  Skin: Skin is  warm, dry and intact. No rash noted.  Psychiatric: She has a normal mood and affect. Her speech is normal and behavior is normal. Judgment and thought content normal. Her mood appears not anxious. Cognition and memory are normal. Cognition and memory are not impaired. She does not exhibit a depressed mood. She exhibits normal recent memory and normal remote memory.          Assessment & Plan:

## 2018-01-21 DIAGNOSIS — R2689 Other abnormalities of gait and mobility: Secondary | ICD-10-CM | POA: Insufficient documentation

## 2018-01-21 LAB — COMPREHENSIVE METABOLIC PANEL
ALBUMIN: 4.4 g/dL (ref 3.5–5.2)
ALT: 21 U/L (ref 0–35)
AST: 24 U/L (ref 0–37)
Alkaline Phosphatase: 63 U/L (ref 39–117)
BILIRUBIN TOTAL: 0.4 mg/dL (ref 0.2–1.2)
BUN: 17 mg/dL (ref 6–23)
CO2: 32 mEq/L (ref 19–32)
Calcium: 10 mg/dL (ref 8.4–10.5)
Chloride: 99 mEq/L (ref 96–112)
Creatinine, Ser: 0.86 mg/dL (ref 0.40–1.20)
GFR: 70.98 mL/min (ref >60.00)
Glucose, Bld: 99 mg/dL (ref 70–99)
Potassium: 4.6 mEq/L (ref 3.5–5.1)
Sodium: 137 mEq/L (ref 135–145)
Total Protein: 7.3 g/dL (ref 6.0–8.3)

## 2018-01-21 LAB — LIPID PANEL
Cholesterol: 162 mg/dL (ref 0–200)
HDL: 62.4 mg/dL (ref >39.00)
LDL Cholesterol: 77 mg/dL (ref 0–99)
NONHDL: 99.52
TRIGLYCERIDES: 113 mg/dL (ref 0.0–149.0)
Total CHOL/HDL Ratio: 3
VLDL: 22.6 mg/dL (ref 0.0–40.0)

## 2018-01-21 LAB — VITAMIN B12: Vitamin B-12: 323 pg/mL (ref 211–911)

## 2018-01-21 LAB — TSH: TSH: 2.01 u[IU]/mL (ref 0.35–4.50)

## 2018-01-21 NOTE — Assessment & Plan Note (Signed)
CVA appears old and not likely related to acute issues.   Will start asa fpor secondary prevention as well as recheck chol to make sure LDL  at  New goal < 70.  Refer to neuro for any further recommendations given continued symptoms.

## 2018-01-21 NOTE — Assessment & Plan Note (Signed)
Nml neuro exam.  Not typical symptoms of BPPV/vertigo.  Eval with labs for secondary cause.  Refer to neuro for further eval.

## 2018-03-17 DIAGNOSIS — I639 Cerebral infarction, unspecified: Secondary | ICD-10-CM | POA: Diagnosis not present

## 2018-03-17 DIAGNOSIS — Z8673 Personal history of transient ischemic attack (TIA), and cerebral infarction without residual deficits: Secondary | ICD-10-CM | POA: Diagnosis not present

## 2018-03-27 DIAGNOSIS — Z8673 Personal history of transient ischemic attack (TIA), and cerebral infarction without residual deficits: Secondary | ICD-10-CM | POA: Diagnosis not present

## 2018-03-27 DIAGNOSIS — I63233 Cerebral infarction due to unspecified occlusion or stenosis of bilateral carotid arteries: Secondary | ICD-10-CM | POA: Diagnosis not present

## 2018-03-27 DIAGNOSIS — R002 Palpitations: Secondary | ICD-10-CM | POA: Diagnosis not present

## 2018-03-31 DIAGNOSIS — R55 Syncope and collapse: Secondary | ICD-10-CM | POA: Diagnosis not present

## 2018-03-31 DIAGNOSIS — R011 Cardiac murmur, unspecified: Secondary | ICD-10-CM | POA: Diagnosis not present

## 2018-03-31 DIAGNOSIS — I208 Other forms of angina pectoris: Secondary | ICD-10-CM | POA: Diagnosis not present

## 2018-03-31 DIAGNOSIS — I639 Cerebral infarction, unspecified: Secondary | ICD-10-CM | POA: Diagnosis not present

## 2018-04-17 DIAGNOSIS — R011 Cardiac murmur, unspecified: Secondary | ICD-10-CM | POA: Diagnosis not present

## 2018-04-17 DIAGNOSIS — R55 Syncope and collapse: Secondary | ICD-10-CM | POA: Diagnosis not present

## 2018-04-17 DIAGNOSIS — I208 Other forms of angina pectoris: Secondary | ICD-10-CM | POA: Diagnosis not present

## 2018-04-22 DIAGNOSIS — R011 Cardiac murmur, unspecified: Secondary | ICD-10-CM | POA: Diagnosis not present

## 2018-04-22 DIAGNOSIS — R55 Syncope and collapse: Secondary | ICD-10-CM | POA: Diagnosis not present

## 2018-04-22 DIAGNOSIS — I639 Cerebral infarction, unspecified: Secondary | ICD-10-CM | POA: Diagnosis not present

## 2018-04-22 DIAGNOSIS — I208 Other forms of angina pectoris: Secondary | ICD-10-CM | POA: Diagnosis not present

## 2018-09-01 DIAGNOSIS — I639 Cerebral infarction, unspecified: Secondary | ICD-10-CM | POA: Diagnosis not present

## 2018-10-13 ENCOUNTER — Telehealth: Payer: BC Managed Care – PPO | Admitting: Physician Assistant

## 2018-10-13 DIAGNOSIS — R399 Unspecified symptoms and signs involving the genitourinary system: Secondary | ICD-10-CM

## 2018-10-13 MED ORDER — NITROFURANTOIN MONOHYD MACRO 100 MG PO CAPS: 100.0000 mg | ORAL_CAPSULE | Freq: Two times a day (BID) | ORAL | 0 refills | Status: AC

## 2018-10-13 NOTE — Progress Notes (Addendum)
We are sorry that you are not feeling well.  Here is how we plan to help!  Based on what you shared with me it looks like you most likely have a simple urinary tract infection.  A UTI (Urinary Tract Infection) is a bacterial infection of the bladder.  Most cases of urinary tract infections are simple to treat but a key part of your care is to encourage you to drink plenty of fluids and watch your symptoms carefully.  I have prescribed MacroBid 100 mg twice a day for 5 days.  Your symptoms should gradually improve. Call us if the burning in your urine worsens, you develop worsening fever, back pain or pelvic pain or if your symptoms do not resolve after completing the antibiotic.  Urinary tract infections can be prevented by drinking plenty of water to keep your body hydrated.  Also be sure when you wipe, wipe from front to back and don't hold it in!  If possible, empty your bladder every 4 hours.  Your e-visit answers were reviewed by a board certified advanced clinical practitioner to complete your personal care plan.  Depending on the condition, your plan could have included both over the counter or prescription medications.  If there is a problem please reply  once you have received a response from your provider.  Your safety is important to us.  If you have drug allergies check your prescription carefully.    You can use MyChart to ask questions about today's visit, request a non-urgent call back, or ask for a work or school excuse for 24 hours related to this e-Visit. If it has been greater than 24 hours you will need to follow up with your provider, or enter a new e-Visit to address those concerns.   You will get an e-mail in the next two days asking about your experience.  I hope that your e-visit has been valuable and will speed your recovery. Thank you for using e-visits.   Greater than 5 minutes, yet less than 10 minutes of time have been spent researching, coordinating, and  implementing care for this patient today  

## 2018-11-11 ENCOUNTER — Telehealth: Payer: Self-pay | Admitting: Family Medicine

## 2018-11-11 NOTE — Telephone Encounter (Signed)
Left message asking pt to call office  °

## 2018-11-11 NOTE — Telephone Encounter (Signed)
Please schedule CPE with fasting labs prior with Dr. Diona Browner sometime in the next 3 months.

## 2018-11-20 NOTE — Telephone Encounter (Signed)
Labs 12/3 cpx 12/10

## 2018-12-09 ENCOUNTER — Other Ambulatory Visit: Payer: Self-pay | Admitting: Family Medicine

## 2018-12-09 DIAGNOSIS — Z1231 Encounter for screening mammogram for malignant neoplasm of breast: Secondary | ICD-10-CM

## 2019-01-14 ENCOUNTER — Telehealth: Payer: Self-pay | Admitting: Family Medicine

## 2019-01-14 ENCOUNTER — Other Ambulatory Visit: Payer: BLUE CROSS/BLUE SHIELD

## 2019-01-14 DIAGNOSIS — E559 Vitamin D deficiency, unspecified: Secondary | ICD-10-CM

## 2019-01-14 DIAGNOSIS — R7303 Prediabetes: Secondary | ICD-10-CM

## 2019-01-14 NOTE — Telephone Encounter (Signed)
-----   Message from Ellamae Sia sent at 12/30/2018 10:45 AM EST ----- Regarding: Lab orders for Thursday, 12.3.20 Patient is scheduled for CPX labs, please order future labs, Thanks , Karna Christmas

## 2019-01-15 ENCOUNTER — Telehealth: Payer: BC Managed Care – PPO | Admitting: Family

## 2019-01-15 ENCOUNTER — Other Ambulatory Visit: Payer: Self-pay

## 2019-01-15 ENCOUNTER — Other Ambulatory Visit (INDEPENDENT_AMBULATORY_CARE_PROVIDER_SITE_OTHER): Payer: BC Managed Care – PPO

## 2019-01-15 DIAGNOSIS — E559 Vitamin D deficiency, unspecified: Secondary | ICD-10-CM

## 2019-01-15 DIAGNOSIS — R7303 Prediabetes: Secondary | ICD-10-CM | POA: Diagnosis not present

## 2019-01-15 DIAGNOSIS — R3989 Other symptoms and signs involving the genitourinary system: Secondary | ICD-10-CM

## 2019-01-15 DIAGNOSIS — R300 Dysuria: Secondary | ICD-10-CM

## 2019-01-15 LAB — COMPREHENSIVE METABOLIC PANEL
ALT: 32 U/L (ref 0–35)
AST: 28 U/L (ref 0–37)
Albumin: 4.4 g/dL (ref 3.5–5.2)
Alkaline Phosphatase: 67 U/L (ref 39–117)
BUN: 17 mg/dL (ref 6–23)
CO2: 28 mEq/L (ref 19–32)
Calcium: 9.5 mg/dL (ref 8.4–10.5)
Chloride: 102 mEq/L (ref 96–112)
Creatinine, Ser: 0.86 mg/dL (ref 0.40–1.20)
GFR: 66.57 mL/min (ref >60.00)
Glucose, Bld: 89 mg/dL (ref 70–99)
Potassium: 3.8 mEq/L (ref 3.5–5.1)
Sodium: 138 mEq/L (ref 135–145)
Total Bilirubin: 0.6 mg/dL (ref 0.2–1.2)
Total Protein: 7.3 g/dL (ref 6.0–8.3)

## 2019-01-15 LAB — VITAMIN D 25 HYDROXY (VIT D DEFICIENCY, FRACTURES): VITD: 42.05 ng/mL (ref 30.00–100.00)

## 2019-01-15 LAB — HEMOGLOBIN A1C: Hgb A1c MFr Bld: 5.5 % (ref 4.6–6.5)

## 2019-01-15 LAB — LIPID PANEL
Cholesterol: 188 mg/dL (ref 0–200)
HDL: 65.1 mg/dL (ref >39.00)
LDL Cholesterol: 104 mg/dL — ABNORMAL HIGH (ref 0–99)
NonHDL: 122.67
Total CHOL/HDL Ratio: 3
Triglycerides: 91 mg/dL (ref 0.0–149.0)
VLDL: 18.2 mg/dL (ref 0.0–40.0)

## 2019-01-15 MED ORDER — NITROFURANTOIN MONOHYD MACRO 100 MG PO CAPS: 100.0000 mg | ORAL_CAPSULE | Freq: Two times a day (BID) | ORAL | 0 refills | Status: DC

## 2019-01-15 NOTE — Progress Notes (Signed)
No critical labs need to be addressed urgently. We will discuss labs in detail at upcoming office visit.   

## 2019-01-15 NOTE — Progress Notes (Signed)

## 2019-01-15 NOTE — Progress Notes (Signed)
Based on what you shared with me, I feel your condition warrants further evaluation and I recommend that you be seen for a face to face office visit.  Given your symptoms of burning when you pee and sore on your genital area with discharge you need to be seen face to face.    NOTE: If you entered your credit card information for this eVisit, you will not be charged. You may see a "hold" on your card for the $35 but that hold will drop off and you will not have a charge processed.   If you are having a true medical emergency please call 911.      For an urgent face to face visit, [facility] has five urgent care centers for your convenience:      NEW:  [facility] [place] [facility] [place] Get Driving Directions [id number] [place], [place] [address] . 10 am - 6pm Monday - Friday    [facility]) Get Driving Directions [id number] [address] [place], [place] [address] . 10 am to 8 pm Monday-Friday . 12 pm to 8 pm [facility] Get Driving Directions [id number] [place], [place], [place] [address] . 8 am to 8 pm Monday-Friday . 9 am to 6 pm Saturday . 11 am to 6 pm Sunday     [facility] at [facility] Get Driving Directions  [id number] [address] [place], [place] [address] . 8 am to 8 pm Monday-Friday . 8 am to 4 pm [facility] [place] Get Driving Directions [id number] [place] [place], [place] [address] . 12 pm to 6 pm Monday-Friday      Your e-visit answers were reviewed by a board certified advanced clinical practitioner to complete your personal care plan.  Thank you for using e-Visits.

## 2019-01-20 DIAGNOSIS — H5213 Myopia, bilateral: Secondary | ICD-10-CM | POA: Diagnosis not present

## 2019-01-21 ENCOUNTER — Encounter: Payer: Self-pay | Admitting: Family Medicine

## 2019-01-21 ENCOUNTER — Ambulatory Visit (INDEPENDENT_AMBULATORY_CARE_PROVIDER_SITE_OTHER): Payer: BC Managed Care – PPO | Admitting: Family Medicine

## 2019-01-21 ENCOUNTER — Other Ambulatory Visit: Payer: Self-pay

## 2019-01-21 VITALS — BP 118/70 | HR 74 | Temp 98.5°F | Ht 64.5 in | Wt 142.8 lb

## 2019-01-21 DIAGNOSIS — E7800 Pure hypercholesterolemia, unspecified: Secondary | ICD-10-CM

## 2019-01-21 DIAGNOSIS — Z8673 Personal history of transient ischemic attack (TIA), and cerebral infarction without residual deficits: Secondary | ICD-10-CM

## 2019-01-21 DIAGNOSIS — R399 Unspecified symptoms and signs involving the genitourinary system: Secondary | ICD-10-CM | POA: Diagnosis not present

## 2019-01-21 DIAGNOSIS — E559 Vitamin D deficiency, unspecified: Secondary | ICD-10-CM

## 2019-01-21 DIAGNOSIS — Z0000 Encounter for general adult medical examination without abnormal findings: Secondary | ICD-10-CM | POA: Diagnosis not present

## 2019-01-21 DIAGNOSIS — R7303 Prediabetes: Secondary | ICD-10-CM

## 2019-01-21 DIAGNOSIS — Z23 Encounter for immunization: Secondary | ICD-10-CM

## 2019-01-21 LAB — POC URINALSYSI DIPSTICK (AUTOMATED)
Bilirubin, UA: NEGATIVE
Blood, UA: NEGATIVE
Glucose, UA: NEGATIVE
Ketones, UA: NEGATIVE
Leukocytes, UA: NEGATIVE
Nitrite, UA: NEGATIVE
Protein, UA: NEGATIVE
Spec Grav, UA: 1.015 (ref 1.010–1.025)
Urobilinogen, UA: 0.2 E.U./dL
pH, UA: 6 (ref 5.0–8.0)

## 2019-01-21 NOTE — Assessment & Plan Note (Signed)
Resolved on  Supplement.

## 2019-01-21 NOTE — Assessment & Plan Note (Signed)
Work on low cholesterol diet.  Return for chol recheck in 3 months.. if not at goal will consider changing simvastatin to atorvastatin.

## 2019-01-21 NOTE — Progress Notes (Signed)
Chief Complaint  Patient presents with  . Annual Exam    History of Present Illness: HPI  The patient is here for annual wellness exam and preventative care.    She has just completed  Nitrofurantoin for UTI.Marland Kitchen dysuria has resolved but she still has some urinary pressure, no longer increase frequency.  Elevated Cholesterol: LDL not at goal < 70 with history of CVA.Marland Kitchen on simvastatin 40 mg daily ( was previously at goal in 2019) Lab Results  Component Value Date   CHOL 188 01/15/2019   HDL 65.10 01/15/2019   LDLCALC 104 (H) 01/15/2019   LDLDIRECT 164.4 06/02/2012   TRIG 91.0 01/15/2019   CHOLHDL 3 01/15/2019  Using medications without problems: Muscle aches:  Diet compliance: moderate.. has been eating more fatty foods lately. Exercise: walking 30 min a day Other complaints: Hx of CVA on ASA81 mg daily  Followed by Dr. [facility] : no evidence f afib on holter moniter.  Neg cardiac work up.  Saw cardiology for CVA 03/2018  No further dizziness.   prediabetes : resolved Lab Results  Component Value Date   HGBA1C 5.5 01/15/2019    Vit D def:  Resolved with supplement  This visit occurred during the SARS-CoV-2 public health emergency.  Safety protocols were in place, including screening questions prior to the visit, additional usage of staff PPE, and extensive cleaning of exam room while observing appropriate contact time as indicated for disinfecting solutions.   COVID 19 screen:  No recent travel or known exposure to COVID19 The patient denies respiratory symptoms of COVID 19 at this time. The importance of social distancing was discussed today.     Review of Systems  Constitutional: Negative for chills and fever.  HENT: Negative for congestion and ear pain.   Eyes: Negative for pain and redness.  Respiratory: Negative for cough and shortness of breath.   Cardiovascular: Negative for chest pain, palpitations and leg swelling.  Gastrointestinal: Negative for  abdominal pain, blood in stool, constipation, diarrhea, nausea and vomiting.  Genitourinary: Positive for dysuria. Negative for flank pain, frequency and hematuria.  Musculoskeletal: Negative for falls and myalgias.  Skin: Negative for rash.  Neurological: Negative for dizziness.  Psychiatric/Behavioral: Negative for depression. The patient is not nervous/anxious.       Past Medical History:  Diagnosis Date  . Breast mass   . Hyperlipidemia     reports that she has quit smoking. She smoked 0.00 packs per day for 30.00 years. She has never used smokeless tobacco. She reports current alcohol use. She reports that she does not use drugs.   Current Outpatient Medications:  .  calcium carbonate (OS-CAL) 600 MG TABS, Take 600 mg by mouth 2 (two) times daily with a meal., Disp: , Rfl:  .  cholecalciferol (VITAMIN D) 1000 UNITS tablet, Take 1,000 Units by mouth daily., Disp: , Rfl:  .  fish oil-omega-3 fatty acids 1000 MG capsule, Take 2 g by mouth daily., Disp: , Rfl:  .  ibuprofen (ADVIL,MOTRIN) 200 MG tablet, Take 200 mg by mouth every 6 (six) hours as needed., Disp: , Rfl:  .  simvastatin (ZOCOR) 40 MG tablet, TAKE 1 TABLET EVERY DAY, Disp: 90 tablet, Rfl: 0   Observations/Objective: Blood pressure 118/70, pulse 74, temperature 98.5 F (36.9 C), temperature source Temporal, height 5' 4.5" (1.638 m), weight 142 lb 12 oz (64.8 kg), SpO2 100 %.  Physical Exam Constitutional:      General: She is not in acute distress.  Appearance: Normal appearance. She is well-developed. She is not ill-appearing or toxic-appearing.  HENT:     Head: Normocephalic.     Right Ear: Hearing, tympanic membrane, ear canal and external ear normal.     Left Ear: Hearing, tympanic membrane, ear canal and external ear normal.     Nose: Nose normal.  Eyes:     General: Lids are normal. Lids are everted, no foreign bodies appreciated.     Conjunctiva/sclera: Conjunctivae normal.     Pupils: Pupils are equal,  round, and reactive to light.  Neck:     Thyroid: No thyroid mass or thyromegaly.     Vascular: No carotid bruit.     Trachea: Trachea normal.  Cardiovascular:     Rate and Rhythm: Normal rate and regular rhythm.     Heart sounds: Normal heart sounds, S1 normal and S2 normal. No murmur. No gallop.   Pulmonary:     Effort: Pulmonary effort is normal. No respiratory distress.     Breath sounds: Normal breath sounds. No wheezing, rhonchi or rales.  Abdominal:     General: Bowel sounds are normal. There is no distension or abdominal bruit.     Palpations: Abdomen is soft. There is no fluid wave or mass.     Tenderness: There is no abdominal tenderness. There is no guarding or rebound.     Hernia: No hernia is present.  Musculoskeletal:     Cervical back: Normal range of motion and neck supple.  Lymphadenopathy:     Cervical: No cervical adenopathy.  Skin:    General: Skin is warm and dry.     Findings: No rash.  Neurological:     Mental Status: She is alert.     Cranial Nerves: No cranial nerve deficit.     Sensory: No sensory deficit.  Psychiatric:        Mood and Affect: Mood is not anxious or depressed.        Speech: Speech normal.        Behavior: Behavior normal. Behavior is cooperative.        Judgment: Judgment normal.      Assessment and Plan The patient's preventative maintenance and recommended screening tests for an annual wellness exam were reviewed in full today. Brought up to date unless services declined.  Counselled on the importance of diet, exercise, and its role in overall health and mortality. The patient's FH and SH was reviewed, including their home life, tobacco status, and drug and alcohol status.   Vaccines: uptodate flu.. except due for tdap   Mammo:11/2017 nml Mother with yearly breast cancer...  Scheduled in 03/03/2018 Colon: nml 04/2010.Marland Kitchen Repeat in 10 years.  PAP/DVE: Every 5years pap, DVE yearly. Last pap 02/2014 neg HPV, no lower abdominal  symptoms DXA: 11/2016  Stable osteopenia in hip, normal density in spine, repeat in 5 years  Formersmoker, >25 pack year history.Quit 2016 Hep C: neg. STD screen/HIV: refused   Pure hypercholesterolemia  Work on low cholesterol diet.  Return for chol recheck in 3 months.. if not at goal will consider changing simvastatin to atorvastatin.  Vitamin D deficiency Resolved on  Supplement.  Prediabetes Resolved with diet and exercise.  History of cerebrovascular accident (CVA) involving cerebellum  GOal LDL < 70.  On ASA and statin.     Eliezer Lofts, MD

## 2019-01-21 NOTE — Assessment & Plan Note (Signed)
Resolved with diet and exercise.

## 2019-01-21 NOTE — Assessment & Plan Note (Addendum)
GOal LDL < 70.  On ASA and statin.

## 2019-01-21 NOTE — Patient Instructions (Addendum)
Work on low cholesterol diet.  Return for chol recheck in 3 months.. if not at goal will consider changing simvastatin to atorvastatin.  Keep appt for mammogram as scheduled

## 2019-01-21 NOTE — Addendum Note (Signed)
Addended by: Carter Kitten on: 01/21/2019 10:22 AM   Modules accepted: Orders

## 2019-02-11 ENCOUNTER — Other Ambulatory Visit: Payer: Self-pay | Admitting: Family Medicine

## 2019-03-03 ENCOUNTER — Telehealth: Payer: Self-pay

## 2019-03-03 DIAGNOSIS — I83893 Varicose veins of bilateral lower extremities with other complications: Secondary | ICD-10-CM

## 2019-03-03 NOTE — Telephone Encounter (Signed)
Patient contacted the office and states she would like a referral to [facility]. She states she has varicose veins in both legs, mainly in calves - and she does complain of slight soreness from them and states at times they feel itchy. Dr. Diona Browner, is this something you can help with or will she need an office visit? Please advise?

## 2019-03-03 NOTE — Telephone Encounter (Signed)
Spoke with Connie West.  She denies any unilateral swelling or calf pain.  No SOB.  She states they are just ugly.  FYI to Dr. Diona Browner.  Patient is aware that [name] or [name] will be contacting her about the referral appointment

## 2019-03-03 NOTE — Telephone Encounter (Signed)
Referral made.  Let pt know and verify no unilateral swelling  And calf pain or SOB suggesting DVT/PE.  if concern have her make an appt here first.

## 2019-03-04 ENCOUNTER — Ambulatory Visit
Admission: RE | Admit: 2019-03-04 | Discharge: 2019-03-04 | Disposition: A | Discharge status: Home / Self Care | Payer: BLUE CROSS/BLUE SHIELD | Source: Ambulatory Visit | Attending: Family Medicine | Admitting: Family Medicine

## 2019-03-04 DIAGNOSIS — Z1231 Encounter for screening mammogram for malignant neoplasm of breast: Secondary | ICD-10-CM | POA: Diagnosis not present

## 2019-03-15 NOTE — Telephone Encounter (Signed)
[facility] Night - Client Nonclinical Telephone Record AccessNurse Client [place] Night - Client Client Site [place] Physician Eliezer Lofts - MD Contact Type Call Who Is Calling Patient / Member / Family / Caregiver Caller Name Connie West Caller Phone Number [phone] Call Type Message Only Information Provided Reason for Call Returning a Call from the Office Initial Connie West states she missed a call from [place] at the office. She is a patient of Dr. Diona Browner. Additional Comment Provided office hours. Disp. Time Disposition Final User 03/12/2019 5:17:33 PM General Information Provided Yes Trisha Mangle

## 2019-03-15 NOTE — Telephone Encounter (Signed)
Spoke with patient and she wants to go to [facility]. Sent Referral over and gave patient the Referral info , she will call to schedule.

## 2019-03-30 ENCOUNTER — Other Ambulatory Visit (INDEPENDENT_AMBULATORY_CARE_PROVIDER_SITE_OTHER): Payer: Self-pay | Admitting: Vascular Surgery

## 2019-03-30 DIAGNOSIS — I83813 Varicose veins of bilateral lower extremities with pain: Secondary | ICD-10-CM

## 2019-04-01 ENCOUNTER — Encounter (INDEPENDENT_AMBULATORY_CARE_PROVIDER_SITE_OTHER): Payer: Self-pay | Admitting: Nurse Practitioner

## 2019-04-01 ENCOUNTER — Encounter (INDEPENDENT_AMBULATORY_CARE_PROVIDER_SITE_OTHER): Payer: BLUE CROSS/BLUE SHIELD

## 2019-04-06 ENCOUNTER — Other Ambulatory Visit: Payer: Self-pay | Admitting: Family Medicine

## 2019-04-06 ENCOUNTER — Telehealth: Payer: Self-pay

## 2019-04-06 DIAGNOSIS — I83893 Varicose veins of bilateral lower extremities with other complications: Secondary | ICD-10-CM

## 2019-04-06 NOTE — Telephone Encounter (Signed)
Done

## 2019-04-06 NOTE — Telephone Encounter (Signed)
Need new order for pt for [facility].  Pt is scheduled for 04-07-19@2  p.m.

## 2019-04-07 ENCOUNTER — Encounter (INDEPENDENT_AMBULATORY_CARE_PROVIDER_SITE_OTHER): Payer: Self-pay | Admitting: Nurse Practitioner

## 2019-04-07 ENCOUNTER — Other Ambulatory Visit: Payer: Self-pay

## 2019-04-07 ENCOUNTER — Ambulatory Visit (INDEPENDENT_AMBULATORY_CARE_PROVIDER_SITE_OTHER): Payer: BLUE CROSS/BLUE SHIELD | Admitting: Nurse Practitioner

## 2019-04-07 ENCOUNTER — Ambulatory Visit (INDEPENDENT_AMBULATORY_CARE_PROVIDER_SITE_OTHER): Payer: BLUE CROSS/BLUE SHIELD

## 2019-04-07 VITALS — BP 144/81 | HR 80 | Resp 16 | Wt 145.8 lb

## 2019-04-07 DIAGNOSIS — E7800 Pure hypercholesterolemia, unspecified: Secondary | ICD-10-CM | POA: Diagnosis not present

## 2019-04-07 DIAGNOSIS — I83813 Varicose veins of bilateral lower extremities with pain: Secondary | ICD-10-CM | POA: Diagnosis not present

## 2019-04-12 ENCOUNTER — Encounter (INDEPENDENT_AMBULATORY_CARE_PROVIDER_SITE_OTHER): Payer: Self-pay | Admitting: Nurse Practitioner

## 2019-04-12 NOTE — Progress Notes (Signed)
SUBJECTIVE:  Patient ID: Nonah Mattes, female    DOB: January 20, 1956, 64 y.o.   MRN: [id number] Chief Complaint  Patient presents with  . Follow-up    ultrasound follow up    HPI  GWENDOLY BALDWIN is a 64 y.o. female The patient is seen for evaluation of symptomatic varicose veins. The patient relates burning and stinging which worsened steadily throughout the course of the day, particularly with standing. The patient also notes an aching and throbbing pain over the varicosities, particularly with prolonged dependent positions. The symptoms are significantly improved with elevation.  The patient also notes that during hot weather the symptoms are greatly intensified. The patient states the pain from the varicose veins interferes with work, daily exercise, shopping and household maintenance. At this point, the symptoms are persistent and severe enough that they're having a negative impact on lifestyle and are interfering with daily activities.  The patient has worn graduated compression stockings for well over 10 years as she currently works for a medical grade 1 compression sock manufacture.  Patient also utilizes ibuprofen and elevation of her lower extremity but despite these conservative interventions the symptoms continue to be persistent.  The patient also attempts to exercise regularly however that has been somewhat difficult with COVID-19 restrictions  There is no history of DVT, PE or superficial thrombophlebitis. There is no history of ulceration or hemorrhage. The patient denies a significant family history of varicose veins. OB history: G2 P2  There is no history of prior surgical intervention or sclerotherapy.  Today the patient had noninvasive studies which shows reflux bilaterally in the common femoral vein as well as the popliteal vein.  There is no evidence of reflux within the great saphenous vein bilaterally.  There is evidence of significant reflux in the bilateral small saphenous  veins vein diameters ranging from 0.57 cm to 0.73 cm  Past Medical History:  Diagnosis Date  . Breast mass   . Hyperlipidemia     Past Surgical History:  Procedure Laterality Date  . BREAST CYST ASPIRATION Right 07/20/2012   FNA benign  . BREAST CYST ASPIRATION Right   . BREAST SURGERY Right 07-20-12   FNA benign  . CESAREAN SECTION    . CHOLECYSTECTOMY    . OVARY SURGERY      Social History   Socioeconomic History  . Marital status: Legally Separated    Spouse name: Not on file  . Number of children: 2  . Years of education: Not on file  . Highest education level: Not on file  Occupational History  . Occupation: Statistician: [employer]  Tobacco Use  . Smoking status: Former Smoker    Packs/day: 0.00    Years: 30.00    Pack years: 0.00  . Smokeless tobacco: Never Used  . Tobacco comment: quit x 1 month 11/16  Substance and Sexual Activity  . Alcohol use: Yes    Alcohol/week: 0.0 standard drinks    Comment: rarely  . Drug use: No  . Sexual activity: Not on file  Other Topics Concern  . Not on file  Social History Narrative   From [place]         Social Determinants of Health   Financial Resource Strain:   . Difficulty of Paying Living Expenses: Not on file  Food Insecurity:   . Worried About Charity fundraiser in the Last Year: Not on file  . Ran Out of Food in the Last Year: Not  on file  Transportation Needs:   . Lack of Transportation (Medical): Not on file  . Lack of Transportation (Non-Medical): Not on file  Physical Activity:   . Days of Exercise per Week: Not on file  . Minutes of Exercise per Session: Not on file  Stress:   . Feeling of Stress : Not on file  Social Connections:   . Frequency of Communication with Friends and Family: Not on file  . Frequency of Social Gatherings with Friends and Family: Not on file  . Attends Religious Services: Not on file  . Active Member of Clubs or Organizations: Not on file  . Attends English as a second language teacher Meetings: Not on file  . Marital Status: Not on file  Intimate Partner Violence:   . Fear of Current or Ex-Partner: Not on file  . Emotionally Abused: Not on file  . Physically Abused: Not on file  . Sexually Abused: Not on file    Family History  Problem Relation Age of Onset  . Breast cancer Mother 35  . Cancer Mother        breast    Allergies  Allergen Reactions  . Penicillins     REACTION: hives     Review of Systems   Review of Systems: Negative Unless Checked Constitutional: [] Weight loss  [] Fever  [] Chills Cardiac: [] Chest pain   []  Atrial Fibrillation  [] Palpitations   [] Shortness of breath when laying flat   [] Shortness of breath with exertion. [] Shortness of breath at rest Vascular:  [] Pain in legs with walking   [] Pain in legs with standing [] Pain in legs when laying flat   [] Claudication    [] Pain in feet when laying flat    [] History of DVT   [] Phlebitis   [x] Swelling in legs   [x] Varicose veins   [] Non-healing ulcers Pulmonary:   [] Uses home oxygen   [] Productive cough   [] Hemoptysis   [] Wheeze  [] COPD   [] Asthma Neurologic:  [] Dizziness   [] Seizures  [] Blackouts [] History of stroke   [] History of TIA  [] Aphasia   [] Temporary Blindness   [] Weakness or numbness in arm   [] Weakness or numbness in leg Musculoskeletal:   [] Joint swelling   [] Joint pain   [] Low back pain  []  History of Knee Replacement [] Arthritis [] back Surgeries  []  Spinal Stenosis    Hematologic:  [] Easy bruising  [] Easy bleeding   [] Hypercoagulable state   [] Anemic Gastrointestinal:  [] Diarrhea   [] Vomiting  [] Gastroesophageal reflux/heartburn   [] Difficulty swallowing. [] Abdominal pain Genitourinary:  [] Chronic kidney disease   [] Difficult urination  [] Anuric   [] Blood in urine [] Frequent urination  [] Burning with urination   [] Hematuria Skin:  [] Rashes   [] Ulcers [] Wounds Psychological:  [] History of anxiety   []  History of major depression  []  Memory Difficulties       OBJECTIVE:   Physical Exam  BP (!) 144/81 (BP Location: Right Arm)   Pulse 80   Resp 16   Wt 145 lb 12.8 oz (66.1 kg)   BMI 24.64 kg/m   Gen: WD/WN, NAD Head: [place]/AT, No temporalis wasting.  Ear/Nose/Throat: Hearing grossly intact, nares w/o erythema or drainage Eyes: PER, EOMI, sclera nonicteric.  Neck: Supple, no masses.  No JVD.  Pulmonary:  Good air movement, no use of accessory muscles.  Cardiac: RRR Vascular:  Scattered spider varicosities bilaterally.  Prominent palpable varicosities in the bilateral calfs 5 to 7 mm in size Vessel Right Left  Dorsalis Pedis Palpable Palpable  Posterior Tibial Palpable Palpable  Gastrointestinal: soft, non-distended. No guarding/no peritoneal signs.  Musculoskeletal: M/S 5/5 throughout.  No deformity or atrophy.  Neurologic: Pain and light touch intact in extremities.  Symmetrical.  Speech is fluent. Motor exam as listed above. Psychiatric: Judgment intact, Mood & affect appropriate for pt's clinical situation. Dermatologic:  Mild stasis dermatitis. No Ulcers Noted.  No changes consistent with cellulitis. Lymph : No Cervical lymphadenopathy, no lichenification or skin changes of chronic lymphedema.       ASSESSMENT AND PLAN:  1. Varicose veins of both lower extremities with pain Recommend  I have reviewed my previous  discussion with the patient regarding  varicose veins and why they cause symptoms. Patient will continue  wearing graduated compression stockings class 1 on a daily basis, beginning first thing in the morning and removing them in the evening.    In addition, behavioral modification including elevation during the day was again discussed and this will continue.  The patient has utilized over the counter pain medications and has been exercising.  However, at this time conservative therapy has not alleviated the patient's symptoms of leg pain and swelling  Recommend: laser ablation of the right and  left small saphenous  veins to eliminate the symptoms of pain and swelling of the lower extremities caused by the severe superficial venous reflux disease.   2. Pure hypercholesterolemia Good lipid control is important for atherosclerotic disease progression.  Patient on appropriate medications no changes needed.   Current Outpatient Medications on File Prior to Visit  Medication Sig Dispense Refill  . calcium carbonate (OS-CAL) 600 MG TABS Take 600 mg by mouth 2 (two) times daily with a meal.    . cholecalciferol (VITAMIN D) 1000 UNITS tablet Take 1,000 Units by mouth daily.    . fish oil-omega-3 fatty acids 1000 MG capsule Take 2 g by mouth daily.    Marland Kitchen ibuprofen (ADVIL,MOTRIN) 200 MG tablet Take 200 mg by mouth every 6 (six) hours as needed.    . simvastatin (ZOCOR) 40 MG tablet TAKE 1 TABLET BY MOUTH EVERY DAY 90 tablet 3   No current facility-administered medications on file prior to visit.    There are no Patient Instructions on file for this visit. No follow-ups on file.   Kris Hartmann, NP  This note was completed with Sales executive.  Any errors are purely unintentional.

## 2019-04-19 ENCOUNTER — Telehealth: Payer: Self-pay

## 2019-04-19 NOTE — Telephone Encounter (Signed)
LVM to call clinic, pt needs COVID screen 3.8.2021 TLJ

## 2019-04-20 ENCOUNTER — Telehealth: Payer: Self-pay | Admitting: Family Medicine

## 2019-04-20 DIAGNOSIS — R7303 Prediabetes: Secondary | ICD-10-CM

## 2019-04-20 DIAGNOSIS — E7800 Pure hypercholesterolemia, unspecified: Secondary | ICD-10-CM

## 2019-04-20 DIAGNOSIS — E559 Vitamin D deficiency, unspecified: Secondary | ICD-10-CM

## 2019-04-20 NOTE — Telephone Encounter (Signed)
-----   Message from Cloyd Stagers, RT sent at 04/08/2019  2:59 PM EST ----- Regarding: Lab Orders for Wednesday 3.10.2021 Please place lab orders for Wednesday 3.10.2021, appt notes state "f/u labs" Thank you, Dyke Maes RT(R)

## 2019-04-21 ENCOUNTER — Other Ambulatory Visit (INDEPENDENT_AMBULATORY_CARE_PROVIDER_SITE_OTHER): Payer: BLUE CROSS/BLUE SHIELD

## 2019-04-21 ENCOUNTER — Other Ambulatory Visit: Payer: Self-pay

## 2019-04-21 DIAGNOSIS — R7303 Prediabetes: Secondary | ICD-10-CM

## 2019-04-21 DIAGNOSIS — E7800 Pure hypercholesterolemia, unspecified: Secondary | ICD-10-CM

## 2019-04-21 LAB — LIPID PANEL
Cholesterol: 146 mg/dL (ref 0–200)
HDL: 60.5 mg/dL (ref >39.00)
LDL Cholesterol: 60 mg/dL (ref 0–99)
NonHDL: 85.11
Total CHOL/HDL Ratio: 2
Triglycerides: 125 mg/dL (ref 0.0–149.0)
VLDL: 25 mg/dL (ref 0.0–40.0)

## 2019-04-21 LAB — COMPREHENSIVE METABOLIC PANEL
ALT: 19 U/L (ref 0–35)
AST: 20 U/L (ref 0–37)
Albumin: 4.2 g/dL (ref 3.5–5.2)
Alkaline Phosphatase: 66 U/L (ref 39–117)
BUN: 13 mg/dL (ref 6–23)
CO2: 30 mEq/L (ref 19–32)
Calcium: 9.5 mg/dL (ref 8.4–10.5)
Chloride: 103 mEq/L (ref 96–112)
Creatinine, Ser: 0.83 mg/dL (ref 0.40–1.20)
GFR: 69.3 mL/min (ref >60.00)
Glucose, Bld: 99 mg/dL (ref 70–99)
Potassium: 3.8 mEq/L (ref 3.5–5.1)
Sodium: 139 mEq/L (ref 135–145)
Total Bilirubin: 0.5 mg/dL (ref 0.2–1.2)
Total Protein: 7.3 g/dL (ref 6.0–8.3)

## 2019-04-21 LAB — HEMOGLOBIN A1C: Hgb A1c MFr Bld: 5.5 % (ref 4.6–6.5)

## 2019-05-21 ENCOUNTER — Encounter (INDEPENDENT_AMBULATORY_CARE_PROVIDER_SITE_OTHER): Payer: Self-pay | Admitting: Vascular Surgery

## 2019-05-21 ENCOUNTER — Other Ambulatory Visit: Payer: Self-pay

## 2019-05-21 ENCOUNTER — Ambulatory Visit (INDEPENDENT_AMBULATORY_CARE_PROVIDER_SITE_OTHER): Payer: BLUE CROSS/BLUE SHIELD | Admitting: Vascular Surgery

## 2019-05-21 DIAGNOSIS — I83812 Varicose veins of left lower extremities with pain: Secondary | ICD-10-CM

## 2019-05-21 NOTE — Progress Notes (Signed)
Varicose veins of leg with pain, left     The patient's left lower extremity was sterilely prepped and draped. The ultrasound machine was used to visualize the lesser saphenous vein throughout its course. A segment in the mid to lower calf was selected for access. The lesser saphenous vein was accessed without difficulty using ultrasound guidance with a micro puncture needle. A 0.018 wire was placed beyond the small saphenous-popliteal junction. The 65cm sheath was placed over the wire and the wire and dilator were removed. The laser fiber was placed through the sheath and its tip was placed approximately 4 cm below the small saphenous-popliteal junction. Tumescent anesthesia was then created with a dilute lidocaine solution. Laser energy was then delivered with constant withdrawal of the sheath and laser fiber. Approximately 766 Joules of energy were delivered over a length of 17 cm using the 1470 Hz Venocare machine at [employer]. Sterile dressings were placed. The patient tolerated the procedure well without complications.

## 2019-05-24 ENCOUNTER — Other Ambulatory Visit (INDEPENDENT_AMBULATORY_CARE_PROVIDER_SITE_OTHER): Payer: BLUE CROSS/BLUE SHIELD

## 2019-05-24 ENCOUNTER — Other Ambulatory Visit: Payer: Self-pay

## 2019-05-24 DIAGNOSIS — I83812 Varicose veins of left lower extremities with pain: Secondary | ICD-10-CM | POA: Diagnosis not present

## 2019-05-25 ENCOUNTER — Ambulatory Visit (INDEPENDENT_AMBULATORY_CARE_PROVIDER_SITE_OTHER)
Admission: RE | Admit: 2019-05-25 | Discharge: 2019-05-25 | Disposition: A | Discharge status: Home / Self Care | Payer: BLUE CROSS/BLUE SHIELD | Source: Ambulatory Visit | Attending: Family Medicine | Admitting: Family Medicine

## 2019-05-25 ENCOUNTER — Encounter: Payer: Self-pay | Admitting: Family Medicine

## 2019-05-25 ENCOUNTER — Telehealth: Payer: Self-pay

## 2019-05-25 ENCOUNTER — Ambulatory Visit: Payer: BLUE CROSS/BLUE SHIELD | Admitting: Family Medicine

## 2019-05-25 VITALS — BP 112/68 | HR 80 | Temp 97.9°F | Ht 64.5 in | Wt 149.0 lb

## 2019-05-25 DIAGNOSIS — M7989 Other specified soft tissue disorders: Secondary | ICD-10-CM | POA: Diagnosis not present

## 2019-05-25 DIAGNOSIS — S82425A Nondisplaced transverse fracture of shaft of left fibula, initial encounter for closed fracture: Secondary | ICD-10-CM | POA: Diagnosis not present

## 2019-05-25 DIAGNOSIS — M25472 Effusion, left ankle: Secondary | ICD-10-CM | POA: Diagnosis not present

## 2019-05-25 DIAGNOSIS — R600 Localized edema: Secondary | ICD-10-CM | POA: Diagnosis not present

## 2019-05-25 DIAGNOSIS — M25572 Pain in left ankle and joints of left foot: Secondary | ICD-10-CM | POA: Diagnosis not present

## 2019-05-25 NOTE — Telephone Encounter (Signed)
I left a message for patient regarding her appointment today with Dr. Einar Pheasant. She is coming in today at 4:00 to be seen for swelling in left ankle. Per Dr. Einar Pheasant, patient just had a vascular procedure on her left leg, and we are needing some further questions on patient if the swelling or issues may be related to that precedure? Or if the swelling is related to a new trauma? Will await return phone call.

## 2019-05-25 NOTE — Progress Notes (Signed)
   Subjective:     Connie West is a 64 y.o. female presenting for Ankle Swelling (Left ankle, x2 weeks, painful to touch )     HPI   #Left Ankle swelling - started 2 weeks ago - painful to the touch - has been wearing compression sock w/o improvement - had a varicose vein procedure on this side - came home Thursday night and swelling was very big - swelling only the ankle and foot - no injury  - pain with walking and moving the ankle - skin is red and warm to touch - no fever/chills - has been taking ibuprofen - swelling improves overnight when not walking on it   Review of Systems   Social History   Tobacco Use  Smoking Status Former Smoker  . Packs/day: 0.00  . Years: 30.00  . Pack years: 0.00  Smokeless Tobacco Never Used  Tobacco Comment   quit x 1 month 11/16        Objective:    BP Readings from Last 3 Encounters:  05/25/19 112/68  05/21/19 124/78  04/07/19 (!) 144/81   Wt Readings from Last 3 Encounters:  05/25/19 149 lb (67.6 kg)  05/21/19 147 lb (66.7 kg)  04/07/19 145 lb 12.8 oz (66.1 kg)    BP 112/68 (BP Location: Left Arm, Patient Position: Sitting, Cuff Size: Normal)   Pulse 80   Temp 97.9 F (36.6 C) (Temporal)   Ht 5' 4.5" (1.638 m)   Wt 149 lb (67.6 kg)   SpO2 99%   BMI 25.18 kg/m    Physical Exam Musculoskeletal:     Comments: Left ankle Inspection: Swelling and faint erythema over the lateral ankle into the upper foot Palpation: TTP over the distal fibula ROM: normal Strength: not assessed Pulses normal      DG Ankle Complete Left CLINICAL DATA:  Lateral ankle pain and swelling, osteomyelitis  EXAM: LEFT ANKLE COMPLETE - 3+ VIEW  COMPARISON:  None.  FINDINGS: Suspect a very subtle transverse fracture of the distal left fibula, likely subacute with some evidence of early healing. Overlying soft tissue edema. There is no evidence of arthropathy or other focal bone abnormality. Soft tissues are  unremarkable.  IMPRESSION: Suspect a very subtle transverse fracture of the distal left fibula, likely subacute with some evidence of early healing. Overlying soft tissue edema.         Assessment & Plan:   Problem List Items Addressed This Visit      Musculoskeletal and Integument   Closed nondisplaced transverse fracture of shaft of left fibula    Subacute fracture with healing. Discussed that unclear if she would need a full boot/cast as she has been weight bearing for 2 weeks. Recommended follow-up with sports medicine to discuss management. No know trauma. Did not discuss with patient, however, she has hx of osteopenia will route to PCP to consider follow-up Dexa scan       Other Visit Diagnoses    Left ankle swelling    -  Primary   Relevant Orders   DG Ankle Complete Left (Completed)       Return in about 1 day (around 05/26/2019) for with [facility].  Lesleigh Noe, MD

## 2019-05-25 NOTE — Patient Instructions (Signed)
Small Fracture - return to see Dr. Lorelei Pont tomorrow to discuss management - Stop bearing weight on this foot

## 2019-05-25 NOTE — Assessment & Plan Note (Addendum)
Subacute fracture with healing. Discussed that unclear if she would need a full boot/cast as she has been weight bearing for 2 weeks. Recommended follow-up with sports medicine to discuss management. No know trauma. Did not discuss with patient, however, she has hx of osteopenia will route to PCP to consider follow-up Dexa scan

## 2019-05-26 ENCOUNTER — Other Ambulatory Visit: Payer: Self-pay

## 2019-05-26 ENCOUNTER — Ambulatory Visit: Payer: BLUE CROSS/BLUE SHIELD | Admitting: Family Medicine

## 2019-05-26 ENCOUNTER — Encounter: Payer: Self-pay | Admitting: Family Medicine

## 2019-05-26 VITALS — BP 130/72 | HR 82 | Temp 97.6°F | Ht 64.5 in | Wt 149.5 lb

## 2019-05-26 DIAGNOSIS — S82839A Other fracture of upper and lower end of unspecified fibula, initial encounter for closed fracture: Secondary | ICD-10-CM | POA: Diagnosis not present

## 2019-05-26 DIAGNOSIS — S82425A Nondisplaced transverse fracture of shaft of left fibula, initial encounter for closed fracture: Secondary | ICD-10-CM | POA: Diagnosis not present

## 2019-05-26 NOTE — Progress Notes (Signed)
Mallory Schaad T. David Rodriquez, MD, [place] at [facility] [place], [address]  Phone: [phone]  FAX: [phone]  Connie West - 64 y.o. female  MRN [id number]  Date of Birth: 1955/04/16  Date: 05/26/2019  PCP: Jinny Sanders, MD  Referral: Jinny Sanders, MD  Chief Complaint  Patient presents with  . Ankle Injury    Left ankle fx-Seen by Dr. Einar Pheasant yesterday    This visit occurred during the SARS-CoV-2 public health emergency.  Safety protocols were in place, including screening questions prior to the visit, additional usage of staff PPE, and extensive cleaning of exam room while observing appropriate contact time as indicated for disinfecting solutions.   Subjective:   Connie West is a 64 y.o. very pleasant female patient with Body mass index is 25.27 kg/m. who presents with the following:  Distal fibular fracture.  She is a pleasant young lady who has some osteopenia who developed a distal fibular fracture without any specific injury or trauma.  She does have some swelling and pain and has had some pain with walking for approximately 2 weeks.  I was asked to see her by my partner Dr. Einar Pheasant.  She has been ambulating on this for about 2 weeks.  She thinks it is gotten worse a little bit over the last 2 days.  She has had to modify her gait to favor this ankle.  Review of Systems is noted in the HPI, as appropriate   Objective:   BP 130/72   Pulse 82   Temp 97.6 F (36.4 C) (Temporal)   Ht 5' 4.5" (1.638 m)   Wt 149 lb 8 oz (67.8 kg)   SpO2 99%   BMI 25.27 kg/m   GEN: No acute distress; alert,appropriate. PULM: Breathing comfortably in no respiratory distress PSYCH: Normally interactive.   The entirety of the right lower extremity is nontender with no bony tenderness.  Patient is nontender in the forefoot and midfoot as well as the hindfoot.  The ATFL, deltoid and CFL ligaments  are nontender.  Nontender at the talus, cuboid, navicular, and the entirety of the fifth metatarsal.  Nontender with movement at the knee without an effusion, nontender throughout the length of the tibia.  Proximal fibula is nontender.  There is some swelling around the distal fibula, and she has her greatest tenderness at the distal fibula, but superior to the lateral malleolus.  Radiology: DG Ankle Complete Left  Result Date: 05/25/2019 CLINICAL DATA:  Lateral ankle pain and swelling, osteomyelitis EXAM: LEFT ANKLE COMPLETE - 3+ VIEW COMPARISON:  None. FINDINGS: Suspect a very subtle transverse fracture of the distal left fibula, likely subacute with some evidence of early healing. Overlying soft tissue edema. There is no evidence of arthropathy or other focal bone abnormality. Soft tissues are unremarkable. IMPRESSION: Suspect a very subtle transverse fracture of the distal left fibula, likely subacute with some evidence of early healing. Overlying soft tissue edema. Electronically Signed   By: Eddie Candle M.D.   On: 05/25/2019 16:50    Assessment and Plan:     ICD-10-CM   1. Nondisplaced fracture of distal end of fibula  S82.839A    The radiological images were independently reviewed by myself in the office and results were reviewed with the patient. My independent interpretation of images:   X-ray, Ankle: AP, Lateral, and Mortise Views Indication: Ankle pain Findings: At the distal fibula,  there is evidence of a transverse fracture with some early callus formation.  The mortise is intact.  The talus is intact, and there is no other evidence of acute fracture.  There is no significant osteoarthritis. Electronically Signed  By: Owens Loffler, MD On: 05/26/2019 12:00 PM EDT   Nondisplaced fracture of the distal fibula, clinically corresponds to this as well as the maximal point of pain.  There is some early callus formation.  I placed the patient in a pneumatic fracture boot.  I anticipate  that she will do well.  Take off her boot and move the ankle at least 2 times a day.  Patient placed in a pneumatic compression-type splint, which has been shown to decrease overall time of rehab, swelling, and faster return to full function.   I appreciate the opportunity to evaluate this very friendly patient. If you have any question regarding her care or prognosis, do not hesitate to ask.   Follow-up: Return in about 1 month (around 06/25/2019).  No orders of the defined types were placed in this encounter.  There are no discontinued medications. No orders of the defined types were placed in this encounter.   Signed,  Maud Deed. Jai Steil, MD   Outpatient Encounter Medications as of 05/26/2019  Medication Sig  . calcium carbonate (OS-CAL) 600 MG TABS Take 600 mg by mouth 2 (two) times daily with a meal.  . cholecalciferol (VITAMIN D) 1000 UNITS tablet Take 1,000 Units by mouth daily.  . fish oil-omega-3 fatty acids 1000 MG capsule Take 2 g by mouth daily.  Marland Kitchen ibuprofen (ADVIL,MOTRIN) 200 MG tablet Take 200 mg by mouth every 6 (six) hours as needed.  . simvastatin (ZOCOR) 40 MG tablet TAKE 1 TABLET BY MOUTH EVERY DAY   No facility-administered encounter medications on file as of 05/26/2019.

## 2019-05-31 ENCOUNTER — Ambulatory Visit: Payer: BLUE CROSS/BLUE SHIELD | Admitting: Family Medicine

## 2019-06-04 ENCOUNTER — Encounter (INDEPENDENT_AMBULATORY_CARE_PROVIDER_SITE_OTHER): Payer: Self-pay | Admitting: Vascular Surgery

## 2019-06-04 ENCOUNTER — Ambulatory Visit (INDEPENDENT_AMBULATORY_CARE_PROVIDER_SITE_OTHER): Payer: BLUE CROSS/BLUE SHIELD | Admitting: Vascular Surgery

## 2019-06-04 ENCOUNTER — Other Ambulatory Visit: Payer: Self-pay

## 2019-06-04 DIAGNOSIS — I83811 Varicose veins of right lower extremities with pain: Secondary | ICD-10-CM

## 2019-06-04 NOTE — Progress Notes (Signed)
Varicose veins of leg with pain, right     The patient's right lower extremity was sterilely prepped and draped. The ultrasound machine was used to visualize the lesser saphenous vein throughout its course. A segment in the mid to lower calf was selected for access. The lesser saphenous vein was accessed without difficulty using ultrasound guidance with a micro puncture needle. A 0.018 wire was placed beyond the small saphenous-popliteal junction. The 65cm sheath was placed over the wire and the wire and dilator were removed. The laser fiber was placed through the sheath and its tip was placed approximately 4 cm below the small saphenous-popliteal junction. Tumescent anesthesia was then created with a dilute lidocaine solution. Laser energy was then delivered with constant withdrawal of the sheath and laser fiber. Approximately 635 Joules of energy were delivered over a length of 17 cm using the 1470 Hz Venocare machine at [employer]. Sterile dressings were placed. The patient tolerated the procedure well without complications.

## 2019-06-07 ENCOUNTER — Other Ambulatory Visit: Payer: Self-pay

## 2019-06-07 ENCOUNTER — Ambulatory Visit (INDEPENDENT_AMBULATORY_CARE_PROVIDER_SITE_OTHER): Payer: BLUE CROSS/BLUE SHIELD

## 2019-06-07 DIAGNOSIS — I83811 Varicose veins of right lower extremities with pain: Secondary | ICD-10-CM

## 2019-06-25 ENCOUNTER — Ambulatory Visit: Payer: BLUE CROSS/BLUE SHIELD | Admitting: Family Medicine

## 2019-06-27 NOTE — Progress Notes (Signed)
Gatlyn Lipari T. Ly Wass, MD, [place] at [facility] [place], [address]  Phone: [phone]  FAX: [phone]  Connie West - 64 y.o. female  MRN [id number]  Date of Birth: May 18, 1955  Date: 06/28/2019  PCP: Jinny Sanders, MD  Referral: Jinny Sanders, MD  Chief Complaint  Patient presents with  . Follow-up    Left Fibula Fx    This visit occurred during the SARS-CoV-2 public health emergency.  Safety protocols were in place, including screening questions prior to the visit, additional usage of staff PPE, and extensive cleaning of exam room while observing appropriate contact time as indicated for disinfecting solutions.   Subjective:   Connie West is a 64 y.o. very pleasant female patient with Body mass index is 25.77 kg/m. who presents with the following:  She is a pleasant young lady who had a nondisplaced distal fibular transverse fracture and I saw her 1 month ago and she had some early callus formation.  She continued to have some pain and placed her in a pneumatic fracture boot.  She is here today in follow-up of this issue.  Today she is here and she is not having any significant pain.  She is having no thoughts without difficulty there is no range of motion.  No deficits in strength.  05/26/2019 Last OV with Owens Loffler, MD  Distal fibular fracture.  She is a pleasant young lady who has some osteopenia who developed a distal fibular fracture without any specific injury or trauma.  She does have some swelling and pain and has had some pain with walking for approximately 2 weeks.  I was asked to see her by my partner Dr. Einar Pheasant.  She has been ambulating on this for about 2 weeks.  She thinks it is gotten worse a little bit over the last 2 days.  She has had to modify her gait to favor this ankle.  Review of Systems is noted in the HPI, as appropriate   Objective:   BP  120/80   Pulse 84   Temp 98.6 F (37 C) (Temporal)   Ht 5' 4.5" (1.638 m)   Wt 152 lb 8 oz (69.2 kg)   SpO2 98%   BMI 25.77 kg/m   GEN: No acute distress; alert,appropriate. PULM: Breathing comfortably in no respiratory distress PSYCH: Normally interactive.   Nontender throughout the tibia, fibula, and the entirety of the midfoot and forefoot.  Drawer testing as well as all ligaments are stable.  There is no pain at all in all directions and strength is 5/5.  Radiology:  Assessment and Plan:     ICD-10-CM   1. Nondisplaced fracture of distal end of fibula  S82.839A    Fully healed, no concerns.  Full range of motion and strength.  Return to activity gradually.  Follow-up as needed  Follow-up: No follow-ups on file.  No orders of the defined types were placed in this encounter.  Medications Discontinued During This Encounter  Medication Reason  . ALPRAZolam (XANAX) 0.5 MG tablet No longer needed (for PRN medications)   No orders of the defined types were placed in this encounter.   Signed,  Maud Deed. Alvey Brockel, MD   Outpatient Encounter Medications as of 06/28/2019  Medication Sig  . aspirin EC 81 MG tablet Take 81 mg by mouth daily.  . calcium carbonate (OS-CAL) 600 MG TABS Take 600 mg by  mouth 2 (two) times daily with a meal.  . cholecalciferol (VITAMIN D) 1000 UNITS tablet Take 1,000 Units by mouth daily.  . fish oil-omega-3 fatty acids 1000 MG capsule Take 2 g by mouth daily.  Marland Kitchen ibuprofen (ADVIL,MOTRIN) 200 MG tablet Take 200 mg by mouth every 6 (six) hours as needed.  . simvastatin (ZOCOR) 40 MG tablet TAKE 1 TABLET BY MOUTH EVERY DAY  . [DISCONTINUED] ALPRAZolam (XANAX) 0.5 MG tablet Take 0.5 mg by mouth 2 (two) times daily as needed.   No facility-administered encounter medications on file as of 06/28/2019.

## 2019-06-28 ENCOUNTER — Other Ambulatory Visit: Payer: Self-pay

## 2019-06-28 ENCOUNTER — Ambulatory Visit: Payer: BLUE CROSS/BLUE SHIELD | Admitting: Family Medicine

## 2019-06-28 ENCOUNTER — Encounter: Payer: Self-pay | Admitting: Family Medicine

## 2019-06-28 VITALS — BP 120/80 | HR 84 | Temp 98.6°F | Ht 64.5 in | Wt 152.5 lb

## 2019-06-28 DIAGNOSIS — S82839A Other fracture of upper and lower end of unspecified fibula, initial encounter for closed fracture: Secondary | ICD-10-CM | POA: Diagnosis not present

## 2019-07-02 ENCOUNTER — Ambulatory Visit (INDEPENDENT_AMBULATORY_CARE_PROVIDER_SITE_OTHER): Payer: BLUE CROSS/BLUE SHIELD | Admitting: Vascular Surgery

## 2019-07-09 ENCOUNTER — Encounter (INDEPENDENT_AMBULATORY_CARE_PROVIDER_SITE_OTHER): Payer: Self-pay | Admitting: Nurse Practitioner

## 2019-07-09 ENCOUNTER — Ambulatory Visit (INDEPENDENT_AMBULATORY_CARE_PROVIDER_SITE_OTHER): Payer: BLUE CROSS/BLUE SHIELD | Admitting: Vascular Surgery

## 2019-07-09 ENCOUNTER — Ambulatory Visit (INDEPENDENT_AMBULATORY_CARE_PROVIDER_SITE_OTHER): Payer: BLUE CROSS/BLUE SHIELD | Admitting: Nurse Practitioner

## 2019-07-09 ENCOUNTER — Other Ambulatory Visit: Payer: Self-pay

## 2019-07-09 VITALS — BP 135/78 | HR 80 | Resp 16 | Wt 150.0 lb

## 2019-07-09 DIAGNOSIS — I83812 Varicose veins of left lower extremities with pain: Secondary | ICD-10-CM

## 2019-07-09 DIAGNOSIS — I83811 Varicose veins of right lower extremities with pain: Secondary | ICD-10-CM | POA: Diagnosis not present

## 2019-07-09 DIAGNOSIS — E7800 Pure hypercholesterolemia, unspecified: Secondary | ICD-10-CM

## 2019-07-13 ENCOUNTER — Encounter (INDEPENDENT_AMBULATORY_CARE_PROVIDER_SITE_OTHER): Payer: Self-pay | Admitting: Nurse Practitioner

## 2019-07-13 NOTE — Progress Notes (Signed)
Subjective:    Patient ID: Nonah Mattes, female    DOB: 09-10-1955, 64 y.o.   MRN: [id number] Chief Complaint  Patient presents with  . Follow-up    post laser follow up    The patient returns to the office for followup status post laser ablation of the bilateral small saphenous veins on 05/24/2019 and 06/04/2019.  The patient notes that the varicosities have mostly shrunken and that the pain and discomfort have nearly been eliminated.  The patient also states that the swelling is greatly decreased as well.  The patient continues to wear medical grade 1 compression stockings and states that her quality of life is greatly improved following endovenous laser ablation.    The patient is otherwise done well and there have been no complications related to the laser procedure or interval changes in the patient's overall   Venous ultrasound post laser shows successful laser ablation of the bilateral small saphenous veins, no DVT identified.   Review of Systems  All other systems reviewed and are negative.      Objective:   Physical Exam Vitals reviewed.  Constitutional:      Appearance: Normal appearance.  HENT:     Head: Normocephalic.  Cardiovascular:     Rate and Rhythm: Normal rate and regular rhythm.  Neurological:     Mental Status: She is alert and oriented to person, place, and time.  Psychiatric:        Mood and Affect: Mood normal.        Behavior: Behavior normal.        Thought Content: Thought content normal.        Judgment: Judgment normal.     BP 135/78 (BP Location: Right Arm)   Pulse 80   Resp 16   Wt 150 lb (68 kg)   BMI 25.35 kg/m   Past Medical History:  Diagnosis Date  . Breast mass   . Hyperlipidemia     Social History   Socioeconomic History  . Marital status: Legally Separated    Spouse name: Not on file  . Number of children: 2  . Years of education: Not on file  . Highest education level: Not on file  Occupational History  . Occupation:  Statistician: [employer]  Tobacco Use  . Smoking status: Former Smoker    Packs/day: 0.00    Years: 30.00    Pack years: 0.00  . Smokeless tobacco: Never Used  . Tobacco comment: quit x 1 month 11/16  Substance and Sexual Activity  . Alcohol use: Yes    Alcohol/week: 0.0 standard drinks    Comment: rarely  . Drug use: No  . Sexual activity: Not on file  Other Topics Concern  . Not on file  Social History Narrative   From [place]         Social Determinants of Health   Financial Resource Strain:   . Difficulty of Paying Living Expenses:   Food Insecurity:   . Worried About Charity fundraiser in the Last Year:   . Arboriculturist in the Last Year:   Transportation Needs:   . Film/video editor (Medical):   Marland Kitchen Lack of Transportation (Non-Medical):   Physical Activity:   . Days of Exercise per Week:   . Minutes of Exercise per Session:   Stress:   . Feeling of Stress :   Social Connections:   . Frequency of Communication with Friends and Family:   .  Frequency of Social Gatherings with Friends and Family:   . Attends Religious Services:   . Active Member of Clubs or Organizations:   . Attends Archivist Meetings:   Marland Kitchen Marital Status:   Intimate Partner Violence:   . Fear of Current or Ex-Partner:   . Emotionally Abused:   Marland Kitchen Physically Abused:   . Sexually Abused:     Past Surgical History:  Procedure Laterality Date  . BREAST CYST ASPIRATION Right 07/20/2012   FNA benign  . BREAST CYST ASPIRATION Right   . BREAST SURGERY Right 07-20-12   FNA benign  . CESAREAN SECTION    . CHOLECYSTECTOMY    . OVARY SURGERY      Family History  Problem Relation Age of Onset  . Breast cancer Mother 24  . Cancer Mother        breast    Allergies  Allergen Reactions  . Penicillins     REACTION: hives       Assessment & Plan:   1. Varicose veins of leg with pain, right Recommend:  The patient has had successful ablation of the previously  incompetent saphenous venous system and previous symptoms of pain and swelling have mostly resolved.   At this time the patient does not wish to undergo sclerotherapy.   The patient will continue wearing the graduated compression stockings and using the over-the-counter pain medications to treat her symptoms.   The patient will follow up on an as needed basis      2. Varicose veins of leg with pain, left See above  3. Pure hypercholesterolemia Continue statin as ordered and reviewed, no changes at this time    Current Outpatient Medications on File Prior to Visit  Medication Sig Dispense Refill  . aspirin EC 81 MG tablet Take 81 mg by mouth daily.    . calcium carbonate (OS-CAL) 600 MG TABS Take 600 mg by mouth 2 (two) times daily with a meal.    . cholecalciferol (VITAMIN D) 1000 UNITS tablet Take 1,000 Units by mouth daily.    . fish oil-omega-3 fatty acids 1000 MG capsule Take 2 g by mouth daily.    Marland Kitchen ibuprofen (ADVIL,MOTRIN) 200 MG tablet Take 200 mg by mouth every 6 (six) hours as needed.    . simvastatin (ZOCOR) 40 MG tablet TAKE 1 TABLET BY MOUTH EVERY DAY 90 tablet 3   No current facility-administered medications on file prior to visit.    There are no Patient Instructions on file for this visit. No follow-ups on file.   Kris Hartmann, NP

## 2019-11-03 ENCOUNTER — Ambulatory Visit: Payer: BLUE CROSS/BLUE SHIELD | Admitting: Dermatology

## 2019-12-01 ENCOUNTER — Ambulatory Visit: Payer: BLUE CROSS/BLUE SHIELD | Admitting: Dermatology

## 2020-01-14 ENCOUNTER — Telehealth: Payer: Self-pay | Admitting: Family Medicine

## 2020-01-14 DIAGNOSIS — Z23 Encounter for immunization: Secondary | ICD-10-CM | POA: Diagnosis not present

## 2020-01-17 ENCOUNTER — Telehealth: Payer: Self-pay | Admitting: Family Medicine

## 2020-01-17 DIAGNOSIS — E559 Vitamin D deficiency, unspecified: Secondary | ICD-10-CM

## 2020-01-17 DIAGNOSIS — E7800 Pure hypercholesterolemia, unspecified: Secondary | ICD-10-CM

## 2020-01-17 NOTE — Telephone Encounter (Signed)
Error

## 2020-01-17 NOTE — Telephone Encounter (Signed)
-----   Message from Cloyd Stagers, RT sent at 01/05/2020 10:41 AM EST ----- Regarding: Lab Orders for Tuesday 12.7.2021 Please place lab orders for Tuesday 12.7.2021, office visit for physical on Tuesday 12.14.2021 Thank you, Dyke Maes RT(R)

## 2020-01-18 ENCOUNTER — Other Ambulatory Visit (INDEPENDENT_AMBULATORY_CARE_PROVIDER_SITE_OTHER): Payer: BLUE CROSS/BLUE SHIELD

## 2020-01-18 ENCOUNTER — Other Ambulatory Visit: Payer: Self-pay

## 2020-01-18 DIAGNOSIS — E7800 Pure hypercholesterolemia, unspecified: Secondary | ICD-10-CM | POA: Diagnosis not present

## 2020-01-18 DIAGNOSIS — E559 Vitamin D deficiency, unspecified: Secondary | ICD-10-CM | POA: Diagnosis not present

## 2020-01-18 LAB — COMPREHENSIVE METABOLIC PANEL
ALT: 22 U/L (ref 0–35)
AST: 24 U/L (ref 0–37)
Albumin: 4.3 g/dL (ref 3.5–5.2)
Alkaline Phosphatase: 69 U/L (ref 39–117)
BUN: 11 mg/dL (ref 6–23)
CO2: 30 mEq/L (ref 19–32)
Calcium: 9.6 mg/dL (ref 8.4–10.5)
Chloride: 102 mEq/L (ref 96–112)
Creatinine, Ser: 0.92 mg/dL (ref 0.40–1.20)
GFR: 65.88 mL/min (ref >60.00)
Glucose, Bld: 92 mg/dL (ref 70–99)
Potassium: 3.9 mEq/L (ref 3.5–5.1)
Sodium: 140 mEq/L (ref 135–145)
Total Bilirubin: 0.4 mg/dL (ref 0.2–1.2)
Total Protein: 7.5 g/dL (ref 6.0–8.3)

## 2020-01-18 LAB — LIPID PANEL
Cholesterol: 155 mg/dL (ref 0–200)
HDL: 52.3 mg/dL (ref >39.00)
LDL Cholesterol: 81 mg/dL (ref 0–99)
NonHDL: 103.03
Total CHOL/HDL Ratio: 3
Triglycerides: 111 mg/dL (ref 0.0–149.0)
VLDL: 22.2 mg/dL (ref 0.0–40.0)

## 2020-01-18 LAB — VITAMIN D 25 HYDROXY (VIT D DEFICIENCY, FRACTURES): VITD: 39.42 ng/mL (ref 30.00–100.00)

## 2020-01-18 NOTE — Progress Notes (Signed)
No critical labs need to be addressed urgently. We will discuss labs in detail at upcoming office visit.   

## 2020-01-25 ENCOUNTER — Encounter: Payer: Self-pay | Admitting: Family Medicine

## 2020-01-25 ENCOUNTER — Ambulatory Visit (INDEPENDENT_AMBULATORY_CARE_PROVIDER_SITE_OTHER): Payer: BLUE CROSS/BLUE SHIELD | Admitting: Family Medicine

## 2020-01-25 ENCOUNTER — Other Ambulatory Visit: Payer: Self-pay

## 2020-01-25 ENCOUNTER — Other Ambulatory Visit (HOSPITAL_COMMUNITY)
Admission: RE | Admit: 2020-01-25 | Discharge: 2020-01-25 | Disposition: A | Discharge status: Home / Self Care | Payer: BLUE CROSS/BLUE SHIELD | Source: Ambulatory Visit | Attending: Family Medicine | Admitting: Family Medicine

## 2020-01-25 VITALS — BP 100/64 | HR 70 | Temp 97.9°F | Resp 14 | Ht 64.5 in | Wt 147.8 lb

## 2020-01-25 DIAGNOSIS — Z124 Encounter for screening for malignant neoplasm of cervix: Secondary | ICD-10-CM

## 2020-01-25 DIAGNOSIS — Z0000 Encounter for general adult medical examination without abnormal findings: Secondary | ICD-10-CM | POA: Diagnosis not present

## 2020-01-25 DIAGNOSIS — E7800 Pure hypercholesterolemia, unspecified: Secondary | ICD-10-CM

## 2020-01-25 DIAGNOSIS — E559 Vitamin D deficiency, unspecified: Secondary | ICD-10-CM

## 2020-01-25 DIAGNOSIS — R7303 Prediabetes: Secondary | ICD-10-CM

## 2020-01-25 NOTE — Assessment & Plan Note (Signed)
resolved 

## 2020-01-25 NOTE — Patient Instructions (Addendum)
Call to set up mammogram in January.  Watch cholesterol in diet.   Preventive Care 94-64 Years Old, Female Preventive care refers to visits with your health care provider and lifestyle choices that can promote health and wellness. This includes:  A yearly physical exam. This may also be called an annual well check.  Regular dental visits and eye exams.  Immunizations.  Screening for certain conditions.  Healthy lifestyle choices, such as eating a healthy diet, getting regular exercise, not using drugs or products that contain nicotine and tobacco, and limiting alcohol use. What can I expect for my preventive care visit? Physical exam Your health care provider will check your:  Height and weight. This may be used to calculate body mass index (BMI), which tells if you are at a healthy weight.  Heart rate and blood pressure.  Skin for abnormal spots. Counseling Your health care provider may ask you questions about your:  Alcohol, tobacco, and drug use.  Emotional well-being.  Home and relationship well-being.  Sexual activity.  Eating habits.  Work and work Statistician.  Method of birth control.  Menstrual cycle.  Pregnancy history. What immunizations do I need?  Influenza (flu) vaccine  This is recommended every year. Tetanus, diphtheria, and pertussis (Tdap) vaccine  You may need a Td booster every 10 years. Varicella (chickenpox) vaccine  You may need this if you have not been vaccinated. Zoster (shingles) vaccine  You may need this after age 22. Measles, mumps, and rubella (MMR) vaccine  You may need at least one dose of MMR if you were born in 1957 or later. You may also need a second dose. Pneumococcal conjugate (PCV13) vaccine  You may need this if you have certain conditions and were not previously vaccinated. Pneumococcal polysaccharide (PPSV23) vaccine  You may need one or two doses if you smoke cigarettes or if you have certain  conditions. Meningococcal conjugate (MenACWY) vaccine  You may need this if you have certain conditions. Hepatitis A vaccine  You may need this if you have certain conditions or if you travel or work in places where you may be exposed to hepatitis A. Hepatitis B vaccine  You may need this if you have certain conditions or if you travel or work in places where you may be exposed to hepatitis B. Haemophilus influenzae type b (Hib) vaccine  You may need this if you have certain conditions. Human papillomavirus (HPV) vaccine  If recommended by your health care provider, you may need three doses over 6 months. You may receive vaccines as individual doses or as more than one vaccine together in one shot (combination vaccines). Talk with your health care provider about the risks and benefits of combination vaccines. What tests do I need? Blood tests  Lipid and cholesterol levels. These may be checked every 5 years, or more frequently if you are over 20 years old.  Hepatitis C test.  Hepatitis B test. Screening  Lung cancer screening. You may have this screening every year starting at age 37 if you have a 30-pack-year history of smoking and currently smoke or have quit within the past 15 years.  Colorectal cancer screening. All adults should have this screening starting at age 71 and continuing until age 52. Your health care provider may recommend screening at age 84 if you are at increased risk. You will have tests every 1-10 years, depending on your results and the type of screening test.  Diabetes screening. This is done by checking your blood sugar (  glucose) after you have not eaten for a while (fasting). You may have this done every 1-3 years.  Mammogram. This may be done every 1-2 years. Talk with your health care provider about when you should start having regular mammograms. This may depend on whether you have a family history of breast cancer.  BRCA-related cancer screening. This  may be done if you have a family history of breast, ovarian, tubal, or peritoneal cancers.  Pelvic exam and Pap test. This may be done every 3 years starting at age 23. Starting at age 75, this may be done every 5 years if you have a Pap test in combination with an HPV test. Other tests  Sexually transmitted disease (STD) testing.  Bone density scan. This is done to screen for osteoporosis. You may have this scan if you are at high risk for osteoporosis. Follow these instructions at home: Eating and drinking  Eat a diet that includes fresh fruits and vegetables, whole grains, lean protein, and low-fat dairy.  Take vitamin and mineral supplements as recommended by your health care provider.  Do not drink alcohol if: ? Your health care provider tells you not to drink. ? You are pregnant, may be pregnant, or are planning to become pregnant.  If you drink alcohol: ? Limit how much you have to 0-1 drink a day. ? Be aware of how much alcohol is in your drink. In the [place] one drink equals one 12 oz bottle of beer (355 mL), one 5 oz glass of wine (148 mL), or one 1 oz glass of hard liquor (44 mL). Lifestyle  Take daily care of your teeth and gums.  Stay active. Exercise for at least 30 minutes on 5 or more days each week.  Do not use any products that contain nicotine or tobacco, such as cigarettes, e-cigarettes, and chewing tobacco. If you need help quitting, ask your health care provider.  If you are sexually active, practice safe sex. Use a condom or other form of birth control (contraception) in order to prevent pregnancy and STIs (sexually transmitted infections).  If told by your health care provider, take low-dose aspirin daily starting at age 90. What's next?  Visit your health care provider once a year for a well check visit.  Ask your health care provider how often you should have your eyes and teeth checked.  Stay up to date on all vaccines. This information is not  intended to replace advice given to you by your health care provider. Make sure you discuss any questions you have with your health care provider. Document Revised: 10/09/2017 Document Reviewed: 10/09/2017 Elsevier Patient Education  2020 [employer].

## 2020-01-25 NOTE — Assessment & Plan Note (Signed)
Stable, chronic.  Continue current medication.   Simvastatin 40 mg daily 

## 2020-01-25 NOTE — Assessment & Plan Note (Signed)
Stable, chronic.  Continue current medication.    

## 2020-01-25 NOTE — Progress Notes (Signed)
Patient ID: Nonah Mattes, female    DOB: 23-Nov-1955, 64 y.o.   MRN: [id number]  This visit was conducted in person.  BP 100/64 (BP Location: Left Arm, Patient Position: Sitting, Cuff Size: Normal)   Pulse 70   Temp 97.9 F (36.6 C) (Oral)   Resp 14   Ht 5' 4.5" (1.638 m)   Wt 147 lb 12.8 oz (67 kg)   SpO2 99%   BMI 24.98 kg/m    CC: annual physical and review of chronic health problems Subjective:   HPI: SHERRI MCARTHY is a 64 y.o. female presenting on 01/25/2020 for Annual Exam (Physical )  Elevated Cholesterol: LDL almost at goal LDL < 70 on  Simvastatin 40 mg daily. Using medications without problems: none Muscle aches: none Diet compliance: Moderate, increase lately Exercise:  Walking daily Other complaints: HX of CVA   prediabetes, resolved  Vit D resolved with supplement  Flowsheet Row Office Visit from 01/25/2020 in [place] at [facility] Total Score 0          Relevant past medical, surgical, family and social history reviewed and updated as indicated. Interim medical history since our last visit reviewed. Allergies and medications reviewed and updated. Outpatient Medications Prior to Visit  Medication Sig Dispense Refill  . aspirin EC 81 MG tablet Take 81 mg by mouth daily.    . calcium carbonate (OS-CAL) 600 MG TABS Take 600 mg by mouth 2 (two) times daily with a meal.    . cholecalciferol (VITAMIN D) 1000 UNITS tablet Take 1,000 Units by mouth daily.    . Coenzyme Q10 (CO Q 10) 100 MG CAPS Take 1 capsule by mouth daily.    . fish oil-omega-3 fatty acids 1000 MG capsule Take 2 g by mouth daily.    Marland Kitchen ibuprofen (ADVIL,MOTRIN) 200 MG tablet Take 200 mg by mouth every 6 (six) hours as needed.    . simvastatin (ZOCOR) 40 MG tablet TAKE 1 TABLET BY MOUTH EVERY DAY 90 tablet 3   No facility-administered medications prior to visit.     Per HPI unless specifically indicated in ROS section below Review of Systems  Constitutional: Negative for  fatigue and fever.  HENT: Negative for congestion.   Eyes: Negative for pain.  Respiratory: Negative for cough and shortness of breath.   Cardiovascular: Negative for chest pain, palpitations and leg swelling.  Gastrointestinal: Negative for abdominal pain.  Genitourinary: Negative for dysuria and vaginal bleeding.  Musculoskeletal: Negative for back pain.  Neurological: Negative for syncope, light-headedness and headaches.  Psychiatric/Behavioral: Negative for dysphoric mood.   Objective:  BP 100/64 (BP Location: Left Arm, Patient Position: Sitting, Cuff Size: Normal)   Pulse 70   Temp 97.9 F (36.6 C) (Oral)   Resp 14   Ht 5' 4.5" (1.638 m)   Wt 147 lb 12.8 oz (67 kg)   SpO2 99%   BMI 24.98 kg/m   Wt Readings from Last 3 Encounters:  01/25/20 147 lb 12.8 oz (67 kg)  07/09/19 150 lb (68 kg)  06/28/19 152 lb 8 oz (69.2 kg)      Physical Exam Constitutional:      General: She is not in acute distress.Vital signs are normal.     Appearance: Normal appearance. She is well-developed and well-nourished. She is not ill-appearing or toxic-appearing.  HENT:     Head: Normocephalic.     Right Ear: Hearing, tympanic membrane, ear canal and external ear normal.  Left Ear: Hearing, tympanic membrane, ear canal and external ear normal.     Nose: Nose normal.  Eyes:     General: Lids are normal. Lids are everted, no foreign bodies appreciated.     Extraocular Movements: EOM normal.     Conjunctiva/sclera: Conjunctivae normal.     Pupils: Pupils are equal, round, and reactive to light.  Neck:     Thyroid: No thyroid mass or thyromegaly.     Vascular: No carotid bruit.     Trachea: Trachea normal.  Cardiovascular:     Rate and Rhythm: Normal rate and regular rhythm.     Pulses: Intact distal pulses.     Heart sounds: Normal heart sounds, S1 normal and S2 normal. No murmur heard. No gallop.   Pulmonary:     Effort: Pulmonary effort is normal. No respiratory distress.     Breath  sounds: Normal breath sounds. No wheezing, rhonchi or rales.  Chest:  Breasts: No discharge from either breast. No tenderness and bleeding.    Abdominal:     General: Bowel sounds are normal. There is no distension or abdominal bruit.     Palpations: Abdomen is soft. There is no fluid wave, hepatosplenomegaly or mass.     Tenderness: There is no abdominal tenderness. There is no CVA tenderness, guarding or rebound.     Hernia: No hernia is present.  Genitourinary:    Exam position: Supine.     Labia:        Right: No rash, tenderness or lesion.        Left: No rash, tenderness or lesion.      Vagina: Normal.     Cervix: No cervical motion tenderness, discharge or friability.     Uterus: Normal. Not enlarged and not tender.      Adnexa:        Right: No mass, tenderness or fullness.         Left: No mass, tenderness or fullness.    Musculoskeletal:     Cervical back: Normal range of motion and neck supple.  Lymphadenopathy:     Cervical: No cervical adenopathy.     Upper Body:  No axillary adenopathy present. Skin:    General: Skin is warm, dry and intact.     Findings: No rash.  Neurological:     Mental Status: She is alert.     Cranial Nerves: No cranial nerve deficit.     Sensory: No sensory deficit.     Deep Tendon Reflexes: Strength normal.  Psychiatric:        Mood and Affect: Mood is not anxious or depressed.        Speech: Speech normal.        Behavior: Behavior normal. Behavior is cooperative.        Cognition and Memory: Cognition and memory normal.        Judgment: Judgment normal.       Results for orders placed or performed in visit on 01/18/20  VITAMIN D 25 Hydroxy (Vit-D Deficiency, Fractures)  Result Value Ref Range   VITD 39.42 30.00 - 100.00 ng/mL  Comprehensive metabolic panel  Result Value Ref Range   Sodium 140 135 - 145 mEq/L   Potassium 3.9 3.5 - 5.1 mEq/L   Chloride 102 96 - 112 mEq/L   CO2 30 19 - 32 mEq/L   Glucose, Bld 92 70 - 99 mg/dL    BUN 11 6 - 23 mg/dL   Creatinine, Ser 0.92 0.40 -  1.20 mg/dL   Total Bilirubin 0.4 0.2 - 1.2 mg/dL   Alkaline Phosphatase 69 39 - 117 U/L   AST 24 0 - 37 U/L   ALT 22 0 - 35 U/L   Total Protein 7.5 6.0 - 8.3 g/dL   Albumin 4.3 3.5 - 5.2 g/dL   GFR 65.88 >60.00 mL/min   Calcium 9.6 8.4 - 10.5 mg/dL  Lipid panel  Result Value Ref Range   Cholesterol 155 0 - 200 mg/dL   Triglycerides 111.0 0.0 - 149.0 mg/dL   HDL 52.30 >39.00 mg/dL   VLDL 22.2 0.0 - 40.0 mg/dL   LDL Cholesterol 81 0 - 99 mg/dL   Total CHOL/HDL Ratio 3    NonHDL 103.03     This visit occurred during the SARS-CoV-2 public health emergency.  Safety protocols were in place, including screening questions prior to the visit, additional usage of staff PPE, and extensive cleaning of exam room while observing appropriate contact time as indicated for disinfecting solutions.   COVID 19 screen:  No recent travel or known exposure to COVID19 The patient denies respiratory symptoms of COVID 19 at this time. The importance of social distancing was discussed today.   Assessment and Plan The patient's preventative maintenance and recommended screening tests for an annual wellness exam were reviewed in full today. Brought up to date unless services declined.  Counselled on the importance of diet, exercise, and its role in overall health and mortality. The patient's FH and SH was reviewed, including their home life, tobacco status, and drug and alcohol status.    Vaccines: uptodate flu, COVID x 3,  Mammo:1/21/2021nml Mother with early breast cancer. Colon: nml 04/2010.Marland Kitchen Repeat in 10 years.  PAP/DVE: Every 5years pap, DVE yearly. Last pap 02/2014 neg HPV, no lower abdominal symptoms.. DUE TODAY DXA:11/2016 Stable osteopenia in hip, normal density in spine, repeat in 5 years  Formersmoker, >25 pack year history.Quit 2016 Hep C: neg. STD screen/HIV: refused    Problem List Items Addressed This Visit     Prediabetes    resolved      Pure hypercholesterolemia (Chronic)    Stable, chronic.  Continue current medication.   Simvastatin 40 mg daily      Vitamin D deficiency    Stable, chronic.  Continue current medication.          Other Visit Diagnoses    Routine general medical examination at a health care facility    -  Primary   Screening for cervical cancer       Relevant Orders   Cytology - PAP( [place])       Eliezer Lofts, MD

## 2020-01-26 LAB — CYTOLOGY - PAP
Comment: NEGATIVE
Diagnosis: NEGATIVE
High risk HPV: NEGATIVE

## 2020-02-03 ENCOUNTER — Other Ambulatory Visit: Payer: Self-pay | Admitting: Family Medicine

## 2020-02-15 ENCOUNTER — Other Ambulatory Visit: Payer: Self-pay | Admitting: Family Medicine

## 2020-02-15 DIAGNOSIS — Z1231 Encounter for screening mammogram for malignant neoplasm of breast: Secondary | ICD-10-CM

## 2020-03-06 ENCOUNTER — Other Ambulatory Visit: Payer: Self-pay

## 2020-03-06 ENCOUNTER — Ambulatory Visit
Admission: RE | Admit: 2020-03-06 | Discharge: 2020-03-06 | Disposition: A | Discharge status: Home / Self Care | Payer: BLUE CROSS/BLUE SHIELD | Source: Ambulatory Visit | Attending: Family Medicine | Admitting: Family Medicine

## 2020-03-06 DIAGNOSIS — Z1231 Encounter for screening mammogram for malignant neoplasm of breast: Secondary | ICD-10-CM | POA: Insufficient documentation

## 2020-03-14 DIAGNOSIS — H524 Presbyopia: Secondary | ICD-10-CM | POA: Diagnosis not present

## 2020-03-20 ENCOUNTER — Telehealth: Payer: BLUE CROSS/BLUE SHIELD | Admitting: Physician Assistant

## 2020-03-20 DIAGNOSIS — L03019 Cellulitis of unspecified finger: Secondary | ICD-10-CM

## 2020-03-20 MED ORDER — SULFAMETHOXAZOLE-TRIMETHOPRIM 800-160 MG PO TABS: 1.0000 | ORAL_TABLET | Freq: Two times a day (BID) | ORAL | 0 refills | Status: DC

## 2020-03-20 NOTE — Progress Notes (Signed)
I have spent 5 minutes in review of e-visit questionnaire, review and updating patient chart, medical decision making and response to patient.   Rafeal Skibicki Cody Janaisa Birkland, PA-C    

## 2020-03-20 NOTE — Progress Notes (Signed)
E Visit for Cellulitis ° °We are sorry that you are not feeling well. Here is how we plan to help! ° °Based on what you shared with me it looks like you have cellulitis.  Cellulitis looks like areas of skin redness, swelling, and warmth; it develops as a result of bacteria entering under the skin. Little red spots and/or bleeding can be seen in skin, and tiny surface sacs containing fluid can occur. Fever can be present. Cellulitis is almost always on one side of a body, and the lower limbs are the most common site of involvement.  ° °I have prescribed:  Bactrim DS 1 tablet by mouth twice a day for 7 days ° °HOME CARE: ° °Take your medications as ordered and take all of them, even if the skin irritation appears to be healing.  ° °GET HELP RIGHT AWAY IF: ° °Symptoms that don't begin to go away within 48 hours. °Severe redness persists or worsens °If the area turns color, spreads or swells. °If it blisters and opens, develops yellow-brown crust or bleeds. °You develop a fever or chills. °If the pain increases or becomes unbearable.  °Are unable to keep fluids and food down. ° °MAKE SURE YOU  ° °Understand these instructions. °Will watch your condition. °Will get help right away if you are not doing well or get worse. ° °Thank you for choosing an e-visit. ° °Your e-visit answers were reviewed by a board certified advanced clinical practitioner to complete your personal care plan. Depending upon the condition, your plan could have included both over the counter or prescription medications. ° °Please review your pharmacy choice. Make sure the pharmacy is open so you can pick up prescription now. If there is a problem, you may contact your provider through MyChart messaging and have the prescription routed to another pharmacy.  Your safety is important to us. If you have drug allergies check your prescription carefully.  ° °For the next 24 hours you can use MyChart to ask questions about today's visit, request a  non-urgent call back, or ask for a work or school excuse. °You will get an email in the next two days asking about your experience. I hope that your e-visit has been valuable and will speed your recovery. ° °

## 2020-03-20 NOTE — Progress Notes (Signed)
Sent message to patient for further clarification of a few things. Awaiting response.

## 2020-07-26 ENCOUNTER — Telehealth: Payer: Self-pay

## 2020-07-26 NOTE — Telephone Encounter (Signed)
Patient called stating she has been having issues with dizziness x 3 weeks-feeling like things are spinning. She has a history of vertigo but the symptoms now have been more consistent. Patient has not checked her b/p and she is not able to check it now due to been at work.  Sending to triage.

## 2020-07-26 NOTE — Telephone Encounter (Signed)
Spoke to patient by telephone and was advised that her symptoms started 3 weeks ago while she was in [place]. Patient stated that she saw a doctor while in [place] and was diagnosed with vertigo. Patient stated that the doctor gave her suppositories because she was vomiting at the time. Patient stated that she only took a couple because they made her feel real sleepy. Patient stated that the dizziness has not gotten any worse since it started but it is not going way. Patient stated that she has been back home for a week. Patient stated that she is at work and unable to check her blood pressure. Patient denies a cough, fever, headache, any numbness or slurred speech. Patient scheduled for an office visit with Dr. Lorelei Pont tomorrow 07/27/20 at 9:20 am. Patient was advised that she should not be driving with the dizziness that she is having. Patient was given ER precautions and she verbalized understanding. Patient had a negative covid screening.

## 2020-07-27 ENCOUNTER — Ambulatory Visit: Payer: BLUE CROSS/BLUE SHIELD | Admitting: Family Medicine

## 2020-07-27 ENCOUNTER — Other Ambulatory Visit: Payer: Self-pay

## 2020-07-27 ENCOUNTER — Encounter: Payer: Self-pay | Admitting: Family Medicine

## 2020-07-27 VITALS — BP 118/86 | HR 87 | Temp 97.5°F | Ht 64.5 in | Wt 150.8 lb

## 2020-07-27 DIAGNOSIS — H8110 Benign paroxysmal vertigo, unspecified ear: Secondary | ICD-10-CM

## 2020-07-27 MED ORDER — MECLIZINE HCL 25 MG PO TABS: 12.5000 mg | ORAL_TABLET | Freq: Three times a day (TID) | ORAL | 0 refills | Status: DC | PRN

## 2020-07-27 NOTE — Patient Instructions (Signed)
Benign Positional Vertigo Vertigo is the feeling that you or your surroundings are moving when they are not. Benign positional vertigo is the most common form of vertigo. This is usually a harmless condition (benign). This condition is positional. This means that symptoms are triggered bycertain movements and positions. This condition can be dangerous if it occurs while you are doing something that could cause harm to yourself or others. This includes activities such asdriving or operating machinery. What are the causes? The inner ear has fluid-filled canals that help your brain sense movement and balance. When the fluid moves, the brain receives messages about your body'sposition. With benign positional vertigo, calcium crystals in the inner ear break free and disturb the inner ear area. This causes your brain to receive confusingmessages about your body's position. What increases the risk? You are more likely to develop this condition if: You are a woman. You are 31 years of age or older. You have recently had a head injury. You have an inner ear disease. What are the signs or symptoms? Symptoms of this condition usually happen when you move your head or your eyes in different directions. Symptoms may start suddenly and usually last for less than a minute. They include: Loss of balance and falling. Feeling like you are spinning or moving. Feeling like your surroundings are spinning or moving. Nausea and vomiting. Blurred vision. Dizziness. Involuntary eye movement (nystagmus). Symptoms can be mild and cause only minor problems, or they can be severe and interfere with daily life. Episodes of benign positional vertigo may return (recur) over time. Symptoms may also improve over time. How is this diagnosed? This condition may be diagnosed based on: Your medical history. A physical exam of the head, neck, and ears. Positional tests to check for or stimulate vertigo. You may be asked to turn  your head and change positions, such as going from sitting to lying down. A health care provider will watch for symptoms of vertigo. You may be referred to a health care provider who specializes in ear, nose, and throat problems (ENT or otolaryngologist) or a provider who specializes in disorders of the nervous system (neurologist). How is this treated?  This condition may be treated in a session in which your health care provider moves your head in specific positions to help the displaced crystals in your inner ear move. Treatment for this condition may take several sessions. Surgerymay be needed in severe cases, but this is rare. In some cases, benign positional vertigo may resolve on its own in 2-4 weeks. Follow these instructions at home: Safety Move slowly. Avoid sudden body or head movements or certain positions, as told by your health care provider. Avoid driving or operating machinery until your health care provider says it is safe. Avoid doing any tasks that would be dangerous to you or others if vertigo occurs. If you have trouble walking or keeping your balance, try using a cane for stability. If you feel dizzy or unstable, sit down right away. Return to your normal activities as told by your health care provider. Ask your health care provider what activities are safe for you. General instructions Take over-the-counter and prescription medicines only as told by your health care provider. Drink enough fluid to keep your urine pale yellow. Keep all follow-up visits. This is important. Contact a health care provider if: You have a fever. Your condition gets worse or you develop new symptoms. Your family or friends notice any behavioral changes. You have nausea or vomiting  that gets worse. You have numbness or a prickling and tingling sensation. Get help right away if you: Have difficulty speaking or moving. Are always dizzy or faint. Develop severe headaches. Have weakness in your  legs or arms. Have changes in your hearing or vision. Develop a stiff neck. Develop sensitivity to light. These symptoms may represent a serious problem that is an emergency. Do not wait to see if the symptoms will go away. Get medical help right away. Call your local emergency services (911 in the [place] Do not drive yourself to the hospital. Summary Vertigo is the feeling that you or your surroundings are moving when they are not. Benign positional vertigo is the most common form of vertigo. This condition is caused by calcium crystals in the inner ear that become displaced. This causes a disturbance in an area of the inner ear that helps your brain sense movement and balance. Symptoms include loss of balance and falling, feeling that you or your surroundings are moving, nausea and vomiting, and blurred vision. This condition can be diagnosed based on symptoms, a physical exam, and positional tests. Follow safety instructions as told by your health care provider and keep all follow-up visits. This is important. This information is not intended to replace advice given to you by your health care provider. Make sure you discuss any questions you have with your healthcare provider. Document Revised: 12/29/2019 Document Reviewed: 12/29/2019 Elsevier Patient Education  2022 [employer].

## 2020-07-27 NOTE — Progress Notes (Signed)
Connie West T. Connie Ammon, MD, [place] [facility] [place], [address]  Phone: [phone]  FAX: [phone]  Connie West - 65 y.o. female  MRN [id number]  Date of Birth: 10-Nov-1955  Date: 07/27/2020  PCP: Jinny Sanders, MD  Referral: Jinny Sanders, MD  Chief Complaint  Patient presents with   Dizziness    Dx with Vertigo on recent trip to [place]    This visit occurred during the SARS-CoV-2 public health emergency.  Safety protocols were in place, including screening questions prior to the visit, additional usage of staff PPE, and extensive cleaning of exam room while observing appropriate contact time as indicated for disinfecting solutions.   Subjective:   Connie West is a 65 y.o. very pleasant female patient with Body mass index is 25.48 kg/m. who presents with the following:  Acute vertigo:  x 3 weeks.   Went to the MD in [place], does feel somewhat better.   She was given some Dimenhydrinate while in [place], but she was unable to tolerate this due to significant sedation and tiredness.  No stroke symptoms.    No focal weakness, blurred vision, slurred speech, facial drooping, numbness, tingling, impaired thought process, and otherwise no neurological complaints.  About 2 or 3 years had it severe.   Feels like imbalanced - at times vertigo.  Somewhat like seasickness. Woke up with sudden and acute and severe after getting out of bed.  Saccades and pursuits induce vertigo Neuro normal  Review of Systems is noted in the HPI, as appropriate  Objective:   BP 118/86   Pulse 87   Temp (!) 97.5 F (36.4 C) (Temporal)   Ht 5' 4.5" (1.638 m)   Wt 150 lb 12 oz (68.4 kg)   SpO2 98%   BMI 25.48 kg/m   GEN: No acute distress; alert,appropriate. PULM: Breathing comfortably in no respiratory distress ENT: Saccades and pursuits induced vertigo. No additional examination was completed secondary to  ongoing worsening of acute vertigo just with saccades and pursuits.  Neuro: CN 2-12 grossly intact. PERRLA. EOMI. Sensation intact throughout. Str 5/5 all extremities. DTR 2+. No clonus. A and o x 4. Romberg neg. Finger nose neg.   PSYCH: Normally interactive. Conversant. Not depressed or anxious appearing.  Calm demeanor.    Laboratory and Imaging Data:  Assessment and Plan:     ICD-10-CM   1. BPV (benign positional vertigo), unspecified laterality  H81.10      Acute on chronic severe vertigo with recurrence.  She is having a great deal of difficulty with even function now, difficulty driving, and this is been severe for 3 weeks.  I think that her original prescription has known induction of the quite significant fatigue and drowsiness, so I am going to change this to some Antivert.  If her symptoms do persist without improving then vestibular rehab would be a good option if not better in 2 or 3 weeks.  Social: This is impairing her basic activities of living including walking, driving, let alone ability to exercise.  Meds ordered this encounter  Medications   meclizine (ANTIVERT) 25 MG tablet    Sig: Take 0.5-1 tablets (12.5-25 mg total) by mouth 3 (three) times daily as needed for dizziness.    Dispense:  50 tablet    Refill:  0   Medications Discontinued During This Encounter  Medication Reason   sulfamethoxazole-trimethoprim (BACTRIM DS) 800-160 MG tablet Completed Course  No orders of the defined types were placed in this encounter.   Follow-up: No follow-ups on file.  Signed,  Maud Deed. Jasir Rother, MD   Outpatient Encounter Medications as of 07/27/2020  Medication Sig   aspirin EC 81 MG tablet Take 81 mg by mouth daily.   calcium carbonate (OS-CAL) 600 MG TABS Take 600 mg by mouth 2 (two) times daily with a meal.   cholecalciferol (VITAMIN D) 1000 UNITS tablet Take 1,000 Units by mouth daily.   Coenzyme Q10 (CO Q 10) 100 MG CAPS Take 1 capsule by mouth daily.    fish oil-omega-3 fatty acids 1000 MG capsule Take 2 g by mouth daily.   ibuprofen (ADVIL,MOTRIN) 200 MG tablet Take 200 mg by mouth every 6 (six) hours as needed.   meclizine (ANTIVERT) 25 MG tablet Take 0.5-1 tablets (12.5-25 mg total) by mouth 3 (three) times daily as needed for dizziness.   simvastatin (ZOCOR) 40 MG tablet TAKE 1 TABLET BY MOUTH EVERY DAY   [DISCONTINUED] sulfamethoxazole-trimethoprim (BACTRIM DS) 800-160 MG tablet Take 1 tablet by mouth 2 (two) times daily.   No facility-administered encounter medications on file as of 07/27/2020.

## 2020-07-28 ENCOUNTER — Encounter: Payer: Self-pay | Admitting: Family Medicine

## 2020-09-20 ENCOUNTER — Telehealth: Payer: BLUE CROSS/BLUE SHIELD | Admitting: Physician Assistant

## 2020-09-20 DIAGNOSIS — R3989 Other symptoms and signs involving the genitourinary system: Secondary | ICD-10-CM | POA: Diagnosis not present

## 2020-09-20 MED ORDER — NITROFURANTOIN MONOHYD MACRO 100 MG PO CAPS: 100.0000 mg | ORAL_CAPSULE | Freq: Two times a day (BID) | ORAL | 0 refills | Status: DC

## 2020-09-20 NOTE — Progress Notes (Signed)

## 2021-01-25 ENCOUNTER — Telehealth: Payer: Self-pay | Admitting: Family Medicine

## 2021-01-25 DIAGNOSIS — R7303 Prediabetes: Secondary | ICD-10-CM

## 2021-01-25 DIAGNOSIS — E559 Vitamin D deficiency, unspecified: Secondary | ICD-10-CM

## 2021-01-25 DIAGNOSIS — E7800 Pure hypercholesterolemia, unspecified: Secondary | ICD-10-CM

## 2021-01-25 NOTE — Telephone Encounter (Signed)
-----   Message from Ellamae Sia sent at 01/10/2021 11:03 AM EST ----- Regarding: Lab orders for Friday, 12.16.22 Patient is scheduled for CPX labs, please order future labs, Thanks , Karna Christmas

## 2021-01-26 ENCOUNTER — Other Ambulatory Visit: Payer: BLUE CROSS/BLUE SHIELD

## 2021-01-28 ENCOUNTER — Other Ambulatory Visit: Payer: Self-pay | Admitting: Family Medicine

## 2021-02-01 ENCOUNTER — Other Ambulatory Visit: Payer: Self-pay

## 2021-02-01 ENCOUNTER — Ambulatory Visit (INDEPENDENT_AMBULATORY_CARE_PROVIDER_SITE_OTHER): Payer: Medicare Other | Admitting: Family Medicine

## 2021-02-01 ENCOUNTER — Encounter: Payer: Self-pay | Admitting: Family Medicine

## 2021-02-01 VITALS — BP 120/80 | HR 87 | Temp 98.3°F | Ht 64.65 in | Wt 159.1 lb

## 2021-02-01 DIAGNOSIS — Z0000 Encounter for general adult medical examination without abnormal findings: Secondary | ICD-10-CM | POA: Diagnosis not present

## 2021-02-01 DIAGNOSIS — Z1211 Encounter for screening for malignant neoplasm of colon: Secondary | ICD-10-CM

## 2021-02-01 DIAGNOSIS — Z23 Encounter for immunization: Secondary | ICD-10-CM | POA: Diagnosis not present

## 2021-02-01 DIAGNOSIS — E7800 Pure hypercholesterolemia, unspecified: Secondary | ICD-10-CM

## 2021-02-01 DIAGNOSIS — R7303 Prediabetes: Secondary | ICD-10-CM

## 2021-02-01 DIAGNOSIS — E559 Vitamin D deficiency, unspecified: Secondary | ICD-10-CM

## 2021-02-01 LAB — COMPREHENSIVE METABOLIC PANEL
ALT: 24 U/L (ref 0–35)
AST: 26 U/L (ref 0–37)
Albumin: 4.4 g/dL (ref 3.5–5.2)
Alkaline Phosphatase: 72 U/L (ref 39–117)
BUN: 13 mg/dL (ref 6–23)
CO2: 32 mEq/L (ref 19–32)
Calcium: 9.8 mg/dL (ref 8.4–10.5)
Chloride: 102 mEq/L (ref 96–112)
Creatinine, Ser: 0.88 mg/dL (ref 0.40–1.20)
GFR: 68.99 mL/min (ref >60.00)
Glucose, Bld: 93 mg/dL (ref 70–99)
Potassium: 4.4 mEq/L (ref 3.5–5.1)
Sodium: 141 mEq/L (ref 135–145)
Total Bilirubin: 0.5 mg/dL (ref 0.2–1.2)
Total Protein: 7.4 g/dL (ref 6.0–8.3)

## 2021-02-01 LAB — HEMOGLOBIN A1C: Hgb A1c MFr Bld: 5.7 % (ref 4.6–6.5)

## 2021-02-01 LAB — LIPID PANEL
Cholesterol: 199 mg/dL (ref 0–200)
HDL: 67 mg/dL (ref >39.00)
NonHDL: 131.51
Total CHOL/HDL Ratio: 3
Triglycerides: 221 mg/dL — ABNORMAL HIGH (ref 0.0–149.0)
VLDL: 44.2 mg/dL — ABNORMAL HIGH (ref 0.0–40.0)

## 2021-02-01 LAB — LDL CHOLESTEROL, DIRECT: Direct LDL: 111 mg/dL

## 2021-02-01 LAB — VITAMIN D 25 HYDROXY (VIT D DEFICIENCY, FRACTURES): VITD: 33.19 ng/mL (ref 30.00–100.00)

## 2021-02-01 NOTE — Progress Notes (Signed)
Patient ID: Connie West, female    DOB: 1955/03/20, 65 y.o.   MRN: [id number]  This visit was conducted in person.  BP 120/80    Pulse 87    Temp 98.3 F (36.8 C) (Temporal)    Ht 5' 4.65" (1.642 m)    Wt 159 lb 2 oz (72.2 kg)    SpO2 99%    BMI 26.77 kg/m    CC: Chief Complaint  Patient presents with   Welcome To Medicare    Subjective:   HPI: Connie West is a 65 y.o. female presenting on 02/01/2021 for Welcome To Medicare  The patient presents for annual medicare wellness, complete physical and review of chronic health problems. He/She also has the following acute concerns today: none  I have personally reviewed the Medicare Annual Wellness questionnaire and have noted 1. The patient's medical and social history 2. Their use of alcohol, tobacco or illicit drugs 3. Their current medications and supplements 4. The patient's functional ability including ADL's, fall risks, home safety risks and hearing or visual             impairment. 5. Diet and physical activities 6. Evidence for depression or mood disorders 7.         Updated provider list Cognitive evaluation was performed and recorded on pt medicare questionnaire form. The patients weight, height, BMI and visual acuity have been recorded in the chart   I have made referrals, counseling and provided education to the patient based review of the above and I have provided the pt with a written personalized care plan for preventive services.   Documentation of this information was scanned into the electronic record under the media tab.   Advance directives and end of life planning reviewed in detail with patient and documented in EMR. Patient given handout on advance care directives if needed. HCPOA and living will updated if needed.  Hearing Screening  Method: Audiometry   500Hz  1000Hz  2000Hz  4000Hz   Right ear 20 20 20 20   Left ear 25 20 20 20    Vision Screening   Right eye Left eye Both eyes  Without correction      With correction 2020 20/25 20/15    Fall Risk 01/25/2020 02/01/2021  Falls in the past year? 0 0  Patient Fall Risk Level Low fall risk -  Fall risk Follow up Falls evaluation completed -    [place] Office Visit from 02/01/2021 in [place] at [facility] Total Score 0        Due for re-eval of cholesterol and Vit D.   Walking 4-5 days a week healthy eating Body mass index is 26.77 kg/m.         Relevant past medical, surgical, family and social history reviewed and updated as indicated. Interim medical history since our last visit reviewed. Allergies and medications reviewed and updated. Outpatient Medications Prior to Visit  Medication Sig Dispense Refill   aspirin EC 81 MG tablet Take 81 mg by mouth daily.     calcium carbonate (OS-CAL) 600 MG TABS Take 600 mg by mouth daily.     cholecalciferol (VITAMIN D) 1000 UNITS tablet Take 1,000 Units by mouth daily.     Coenzyme Q10 (CO Q 10) 100 MG CAPS Take 1 capsule by mouth daily.     fish oil-omega-3 fatty acids 1000 MG capsule Take 1 g by mouth daily.     ibuprofen (ADVIL,MOTRIN) 200 MG tablet Take 200  mg by mouth every 6 (six) hours as needed.     simvastatin (ZOCOR) 40 MG tablet TAKE 1 TABLET BY MOUTH EVERY DAY 90 tablet 0   meclizine (ANTIVERT) 25 MG tablet Take 0.5-1 tablets (12.5-25 mg total) by mouth 3 (three) times daily as needed for dizziness. 50 tablet 0   nitrofurantoin, macrocrystal-monohydrate, (MACROBID) 100 MG capsule Take 1 capsule (100 mg total) by mouth 2 (two) times daily. 10 capsule 0   No facility-administered medications prior to visit.     Per HPI unless specifically indicated in ROS section below Review of Systems  Constitutional:  Negative for fatigue and fever.  HENT:  Negative for congestion.   Eyes:  Negative for pain.  Respiratory:  Negative for cough and shortness of breath.   Cardiovascular:  Negative for chest pain, palpitations and leg swelling.   Gastrointestinal:  Negative for abdominal pain.  Genitourinary:  Negative for dysuria and vaginal bleeding.  Musculoskeletal:  Negative for back pain.  Neurological:  Negative for syncope, light-headedness and headaches.  Psychiatric/Behavioral:  Negative for dysphoric mood.   Objective:  BP 120/80    Pulse 87    Temp 98.3 F (36.8 C) (Temporal)    Ht 5' 4.65" (1.642 m)    Wt 159 lb 2 oz (72.2 kg)    SpO2 99%    BMI 26.77 kg/m   Wt Readings from Last 3 Encounters:  02/01/21 159 lb 2 oz (72.2 kg)  07/27/20 150 lb 12 oz (68.4 kg)  01/25/20 147 lb 12.8 oz (67 kg)      Physical Exam Vitals and nursing note reviewed.  Constitutional:      General: She is not in acute distress.    Appearance: Normal appearance. She is well-developed. She is not ill-appearing or toxic-appearing.  HENT:     Head: Normocephalic.     Right Ear: Hearing, tympanic membrane, ear canal and external ear normal.     Left Ear: Hearing, tympanic membrane, ear canal and external ear normal.     Nose: Nose normal.  Eyes:     General: Lids are normal. Lids are everted, no foreign bodies appreciated.     Conjunctiva/sclera: Conjunctivae normal.     Pupils: Pupils are equal, round, and reactive to light.  Neck:     Thyroid: No thyroid mass or thyromegaly.     Vascular: No carotid bruit.     Trachea: Trachea normal.  Cardiovascular:     Rate and Rhythm: Normal rate and regular rhythm.     Heart sounds: Normal heart sounds, S1 normal and S2 normal. No murmur heard.   No gallop.  Pulmonary:     Effort: Pulmonary effort is normal. No respiratory distress.     Breath sounds: Normal breath sounds. No wheezing, rhonchi or rales.  Abdominal:     General: Bowel sounds are normal. There is no distension or abdominal bruit.     Palpations: Abdomen is soft. There is no fluid wave or mass.     Tenderness: There is no abdominal tenderness. There is no guarding or rebound.     Hernia: No hernia is present.  Musculoskeletal:      Cervical back: Normal range of motion and neck supple.  Lymphadenopathy:     Cervical: No cervical adenopathy.  Skin:    General: Skin is warm and dry.     Findings: No rash.  Neurological:     Mental Status: She is alert.     Cranial Nerves: No cranial nerve  deficit.     Sensory: No sensory deficit.  Psychiatric:        Mood and Affect: Mood is not anxious or depressed.        Speech: Speech normal.        Behavior: Behavior normal. Behavior is cooperative.        Judgment: Judgment normal.      Results for orders placed or performed in visit on 01/25/20  Cytology - PAP( [place])  Result Value Ref Range   High risk HPV Negative    Adequacy      Satisfactory for evaluation; transformation zone component PRESENT.   Diagnosis      - Negative for intraepithelial lesion or malignancy (NILM)   Comment Normal Reference Range HPV - Negative     This visit occurred during the SARS-CoV-2 public health emergency.  Safety protocols were in place, including screening questions prior to the visit, additional usage of staff PPE, and extensive cleaning of exam room while observing appropriate contact time as indicated for disinfecting solutions.   COVID 19 screen:  No recent travel or known exposure to COVID19 The patient denies respiratory symptoms of COVID 19 at this time. The importance of social distancing was discussed today.   Assessment and Plan   The patient's preventative maintenance and recommended screening tests for an annual wellness exam were reviewed in full today. Brought up to date unless services declined.  Counselled on the importance of diet, exercise, and its role in overall health and mortality. The patient's FH and SH was reviewed, including their home life, tobacco status, and drug and alcohol status.   Vaccines: uptodate flu, COVID x 3, Tdap, consider shingles vaccine ( given prescription), Given prevnar 64 today Mammo: 03/03/2020 nml  Mother with early  breast cancer. Colon: nml 04/2010.Marland Kitchen Repeat in 10 years. , due now. PAP/DVE: Every 5 years pap, DVE yearly.  Last pap 01/2020 neg HPV, no lower abdominal symptoms..  DXA: 11/2016  Stable osteopenia in hip, normal density in spine, repeat in 5 years   Former smoker, >25 pack year history.  Quit 2016  Hep C: neg.  STD screen/HIV: refused   Problem List Items Addressed This Visit     Pure hypercholesterolemia (Chronic)    Due for re-eval.  On simvastatin , given CVA history.. LDL goal < 70.      Prediabetes   Vitamin D deficiency    Due for re-eval.      Other Visit Diagnoses     Welcome to Medicare preventive visit    -  Primary      Orders Released This Visit (4)                          Comprehensive metabolic panel - Released 02/01/2021 8:54 AM                     Routine, Clinic Collect                       Hemoglobin A1c - Released 02/01/2021 8:54 AM                     Routine, Clinic Collect               Lipid panel - Released 02/01/2021 8:54 AM  Routine, Clinic Collect       VITAMIN D 25 Hydroxy (Vit-D Deficiency, Fractures) - Released 02/01/2021 8:54 AM                      Eliezer Lofts, MD

## 2021-02-01 NOTE — Assessment & Plan Note (Addendum)
Due for re-eval.  On simvastatin , given CVA history.. LDL goal < 70.

## 2021-02-01 NOTE — Assessment & Plan Note (Signed)
Due for re-eval. 

## 2021-02-01 NOTE — Patient Instructions (Addendum)
Please stop at the lab to have labs drawn.  Call GI office to set up colonoscopy, or consider Cologuard if colonoscopy was normal in 2012. Please call the location of your choice from the menu below to schedule your Mammogram and/or Bone Density appointment.    [place] at [facility]   Phone:  [phone]   [place], [place] [address]                                            Services: 3D Mammogram and [place]  [place] at [facility])  Phone:  [phone]   [address] Room [place], [place] [address]                                              Services:  3D Mammogram and Bone Density

## 2021-02-13 ENCOUNTER — Other Ambulatory Visit: Payer: Self-pay | Admitting: Family Medicine

## 2021-02-13 DIAGNOSIS — Z1231 Encounter for screening mammogram for malignant neoplasm of breast: Secondary | ICD-10-CM

## 2021-02-16 DIAGNOSIS — Z1211 Encounter for screening for malignant neoplasm of colon: Secondary | ICD-10-CM | POA: Diagnosis not present

## 2021-02-20 ENCOUNTER — Telehealth: Payer: Medicare Other | Admitting: Physician Assistant

## 2021-02-20 DIAGNOSIS — R3989 Other symptoms and signs involving the genitourinary system: Secondary | ICD-10-CM

## 2021-02-20 MED ORDER — SULFAMETHOXAZOLE-TRIMETHOPRIM 800-160 MG PO TABS
1.0000 | ORAL_TABLET | Freq: Two times a day (BID) | ORAL | 0 refills | Status: AC
Start: 2021-02-20 — End: 2021-02-25

## 2021-02-20 NOTE — Progress Notes (Signed)

## 2021-02-20 NOTE — Progress Notes (Signed)
I have spent 5 minutes in review of e-visit questionnaire, review and updating patient chart, medical decision making and response to patient.   Dontavious Emily Cody Jeania Nater, PA-C    

## 2021-02-23 ENCOUNTER — Encounter: Payer: Self-pay | Admitting: Family Medicine

## 2021-02-23 DIAGNOSIS — R195 Other fecal abnormalities: Secondary | ICD-10-CM

## 2021-02-23 LAB — COLOGUARD: COLOGUARD: POSITIVE — AB

## 2021-03-01 ENCOUNTER — Telehealth: Payer: Self-pay

## 2021-03-01 NOTE — Telephone Encounter (Signed)
CALLED PATIENT NO ANSWER LEFT VOICEMAIL FOR A CALL BACK ? ?

## 2021-03-02 ENCOUNTER — Other Ambulatory Visit: Payer: Self-pay

## 2021-03-02 DIAGNOSIS — Z1211 Encounter for screening for malignant neoplasm of colon: Secondary | ICD-10-CM

## 2021-03-02 MED ORDER — PEG 3350-KCL-NA BICARB-NACL 420 G PO SOLR: 4000.0000 mL | Freq: Once | ORAL | 0 refills | Status: AC

## 2021-03-02 NOTE — Progress Notes (Signed)
Gastroenterology Pre-Procedure Review  Request Date: 03/21/2021 Requesting Physician: Dr. Marius Ditch  PATIENT REVIEW QUESTIONS: The patient responded to the following health history questions as indicated:    1. Are you having any GI issues? no 2. Do you have a personal history of Polyps? no 3. Do you have a family history of Colon Cancer or Polyps? no 4. Diabetes Mellitus? no 5. Joint replacements in the past 12 months?no 6. Major health problems in the past 3 months?no 7. Any artificial heart valves, MVP, or defibrillator?no    MEDICATIONS & ALLERGIES:    Patient reports the following regarding taking any anticoagulation/antiplatelet therapy:   Plavix, Coumadin, Eliquis, Xarelto, Lovenox, Pradaxa, Brilinta, or Effient? no Aspirin? yes (81 mg)  Patient confirms/reports the following medications:  Current Outpatient Medications  Medication Sig Dispense Refill   aspirin EC 81 MG tablet Take 81 mg by mouth daily.     calcium carbonate (OS-CAL) 600 MG TABS Take 600 mg by mouth daily.     cholecalciferol (VITAMIN D) 1000 UNITS tablet Take 1,000 Units by mouth daily.     Coenzyme Q10 (CO Q 10) 100 MG CAPS Take 1 capsule by mouth daily.     fish oil-omega-3 fatty acids 1000 MG capsule Take 1 g by mouth daily.     ibuprofen (ADVIL,MOTRIN) 200 MG tablet Take 200 mg by mouth every 6 (six) hours as needed.     simvastatin (ZOCOR) 40 MG tablet TAKE 1 TABLET BY MOUTH EVERY DAY 90 tablet 0   No current facility-administered medications for this visit.    Patient confirms/reports the following allergies:  Allergies  Allergen Reactions   Penicillins     REACTION: hives    No orders of the defined types were placed in this encounter.   AUTHORIZATION INFORMATION Primary Insurance: 1D#: Group #:  Secondary Insurance: 1D#: Group #:  SCHEDULE INFORMATION: Date: 03/21/2021 Time: Location:[facility]

## 2021-03-07 ENCOUNTER — Ambulatory Visit
Admission: RE | Admit: 2021-03-07 | Discharge: 2021-03-07 | Disposition: A | Discharge status: Home / Self Care | Payer: Medicare Other | Source: Ambulatory Visit | Attending: Family Medicine | Admitting: Family Medicine

## 2021-03-07 ENCOUNTER — Other Ambulatory Visit: Payer: Self-pay

## 2021-03-07 DIAGNOSIS — Z1231 Encounter for screening mammogram for malignant neoplasm of breast: Secondary | ICD-10-CM | POA: Diagnosis not present

## 2021-03-21 ENCOUNTER — Ambulatory Visit: Payer: Medicare Other | Admitting: Certified Registered"

## 2021-03-21 ENCOUNTER — Ambulatory Visit
Admission: RE | Admit: 2021-03-21 | Discharge: 2021-03-21 | Disposition: A | Discharge status: Home / Self Care | Payer: Medicare Other | Source: Ambulatory Visit | Attending: Gastroenterology | Admitting: Gastroenterology

## 2021-03-21 ENCOUNTER — Encounter: Payer: Self-pay | Admitting: Gastroenterology

## 2021-03-21 ENCOUNTER — Encounter
Admission: RE | Disposition: A | Discharge status: Home / Self Care | Payer: Self-pay | Source: Ambulatory Visit | Attending: Gastroenterology

## 2021-03-21 ENCOUNTER — Other Ambulatory Visit: Payer: Self-pay

## 2021-03-21 DIAGNOSIS — K644 Residual hemorrhoidal skin tags: Secondary | ICD-10-CM | POA: Insufficient documentation

## 2021-03-21 DIAGNOSIS — R195 Other fecal abnormalities: Secondary | ICD-10-CM | POA: Diagnosis not present

## 2021-03-21 DIAGNOSIS — D120 Benign neoplasm of cecum: Secondary | ICD-10-CM | POA: Diagnosis not present

## 2021-03-21 DIAGNOSIS — E785 Hyperlipidemia, unspecified: Secondary | ICD-10-CM | POA: Insufficient documentation

## 2021-03-21 DIAGNOSIS — Z9049 Acquired absence of other specified parts of digestive tract: Secondary | ICD-10-CM | POA: Diagnosis not present

## 2021-03-21 DIAGNOSIS — Z87891 Personal history of nicotine dependence: Secondary | ICD-10-CM | POA: Diagnosis not present

## 2021-03-21 DIAGNOSIS — D124 Benign neoplasm of descending colon: Secondary | ICD-10-CM | POA: Diagnosis not present

## 2021-03-21 DIAGNOSIS — D126 Benign neoplasm of colon, unspecified: Secondary | ICD-10-CM | POA: Diagnosis not present

## 2021-03-21 DIAGNOSIS — K5730 Diverticulosis of large intestine without perforation or abscess without bleeding: Secondary | ICD-10-CM | POA: Insufficient documentation

## 2021-03-21 DIAGNOSIS — K635 Polyp of colon: Secondary | ICD-10-CM | POA: Diagnosis not present

## 2021-03-21 DIAGNOSIS — Z1211 Encounter for screening for malignant neoplasm of colon: Secondary | ICD-10-CM | POA: Diagnosis not present

## 2021-03-21 HISTORY — PX: COLONOSCOPY WITH PROPOFOL: SHX5780

## 2021-03-21 SURGERY — COLONOSCOPY WITH PROPOFOL
Anesthesia: General

## 2021-03-21 MED ORDER — PROPOFOL 10 MG/ML IV BOLUS
INTRAVENOUS | Status: DC | PRN
  Administered 2021-03-21: 100 mg via INTRAVENOUS

## 2021-03-21 MED ORDER — LIDOCAINE HCL (CARDIAC) PF 100 MG/5ML IV SOSY
PREFILLED_SYRINGE | INTRAVENOUS | Status: DC | PRN
  Administered 2021-03-21: 50 mg via INTRAVENOUS

## 2021-03-21 MED ORDER — SODIUM CHLORIDE 0.9 % IV SOLN: INTRAVENOUS | Status: DC

## 2021-03-21 MED ORDER — PROPOFOL 500 MG/50ML IV EMUL
INTRAVENOUS | Status: DC | PRN
  Administered 2021-03-21: 150 ug/kg/min via INTRAVENOUS

## 2021-03-21 NOTE — Anesthesia Procedure Notes (Signed)
Procedure Name: MAC Date/Time: 03/21/2021 9:14 AM Performed by: Biagio Borg, CRNA Pre-anesthesia Checklist: Patient identified, Emergency Drugs available, Suction available, Patient being monitored and Timeout performed Patient Re-evaluated:Patient Re-evaluated prior to induction Oxygen Delivery Method: Nasal cannula Induction Type: IV induction Placement Confirmation: positive ETCO2 and CO2 detector

## 2021-03-21 NOTE — Transfer of Care (Signed)
Immediate Anesthesia Transfer of Care Note  Patient: Connie West  Procedure(s) Performed: COLONOSCOPY WITH PROPOFOL  Patient Location: Endoscopy Unit  Anesthesia Type:General  Level of Consciousness: awake, drowsy and patient cooperative  Airway & Oxygen Therapy: Patient Spontanous Breathing  Post-op Assessment: Report given to RN and Post -op Vital signs reviewed and stable  Post vital signs: Reviewed and stable  Last Vitals:  Vitals Value Taken Time  BP 82/53 03/21/21 1000  Temp    Pulse 64 03/21/21 1001  Resp 16 03/21/21 1001  SpO2 96 % 03/21/21 1001  Vitals shown include unvalidated device data.  Last Pain:  Vitals:   03/21/21 0817  TempSrc: Temporal  PainSc: 0-No pain         Complications: No notable events documented.

## 2021-03-21 NOTE — H&P (Signed)
Cephas Darby, MD [address]  [place]  [place], [place] [address]  Main: [phone]  Fax: [phone] Pager: [phone]  Primary Care Physician:  Jinny Sanders, MD Primary Gastroenterologist:  Dr. Cephas Darby  Pre-Procedure History & Physical: HPI:  Connie West is a 66 y.o. female is here for an colonoscopy.   Past Medical History:  Diagnosis Date   Hyperlipidemia     Past Surgical History:  Procedure Laterality Date   BREAST CYST ASPIRATION Right 07/20/2012   FNA benign   BREAST CYST ASPIRATION Right    BREAST SURGERY Right 07-20-12   FNA benign   CESAREAN SECTION     CHOLECYSTECTOMY     OVARY SURGERY      Prior to Admission medications   Medication Sig Start Date End Date Taking? Authorizing Provider  aspirin EC 81 MG tablet Take 81 mg by mouth daily.   Yes [provider]  calcium carbonate (OS-CAL) 600 MG TABS Take 600 mg by mouth daily.   Yes [provider]  cholecalciferol (VITAMIN D) 1000 UNITS tablet Take 1,000 Units by mouth daily.   Yes [provider]  Coenzyme Q10 (CO Q 10) 100 MG CAPS Take 1 capsule by mouth daily.   Yes [provider]  fish oil-omega-3 fatty acids 1000 MG capsule Take 1 g by mouth daily.   Yes [provider]  simvastatin (ZOCOR) 40 MG tablet TAKE 1 TABLET BY MOUTH EVERY DAY 01/28/21  Yes Bedsole, Amy E, MD  ibuprofen (ADVIL,MOTRIN) 200 MG tablet Take 200 mg by mouth every 6 (six) hours as needed.    [provider]    Allergies as of 03/02/2021 - Review Complete 03/02/2021  Allergen Reaction Noted   Penicillins      Family History  Problem Relation Age of Onset   Breast cancer Mother 48   Cancer Mother        breast    Social History   Socioeconomic History   Marital status: Divorced    Spouse name: Not on file   Number of children: 2   Years of education: Not on file   Highest education level: Not on file  Occupational History   Occupation:  Statistician: [employer]  Tobacco Use   Smoking status: Former    Packs/day: 0.00    Years: 30.00    Pack years: 0.00    Types: Cigarettes   Smokeless tobacco: Never   Tobacco comments:    quit x 1 month 11/16  Vaping Use   Vaping Use: Never used  Substance and Sexual Activity   Alcohol use: Yes    Alcohol/week: 0.0 standard drinks    Comment: rarely   Drug use: No   Sexual activity: Not on file  Other Topics Concern   Not on file  Social History Narrative   From [place]         Social Determinants of Health   Financial Resource Strain: Not on file  Food Insecurity: Not on file  Transportation Needs: Not on file  Physical Activity: Not on file  Stress: Not on file  Social Connections: Not on file  Intimate Partner Violence: Not on file    Review of Systems: See HPI, otherwise negative ROS  Physical Exam: BP (!) 146/84    Pulse 81    Temp (!) 97.1 F (36.2 C) (Temporal)    Resp 17    Ht 5\' 5"  (1.651 m)  Wt 71.7 kg    SpO2 100%    BMI 26.29 kg/m  General:   Alert,  pleasant and cooperative in NAD Head:  Normocephalic and atraumatic. Neck:  Supple; no masses or thyromegaly. Lungs:  Clear throughout to auscultation.    Heart:  Regular rate and rhythm. Abdomen:  Soft, nontender and nondistended. Normal bowel sounds, without guarding, and without rebound.   Neurologic:  Alert and  oriented x4;  grossly normal neurologically.  Impression/Plan: Connie West is here for an colonoscopy to be performed for positive cologuard  Risks, benefits, limitations, and alternatives regarding  colonoscopy have been reviewed with the patient.  Questions have been answered.  All parties agreeable.   Sherri Sear, MD  03/21/2021, 9:01 AM

## 2021-03-21 NOTE — Anesthesia Postprocedure Evaluation (Signed)
Anesthesia Post Note  Patient: Connie West  Procedure(s) Performed: COLONOSCOPY WITH PROPOFOL  Patient location during evaluation: Endoscopy Anesthesia Type: General Level of consciousness: awake and alert Pain management: pain level controlled Vital Signs Assessment: post-procedure vital signs reviewed and stable Respiratory status: spontaneous breathing, nonlabored ventilation, respiratory function stable and patient connected to nasal cannula oxygen Cardiovascular status: blood pressure returned to baseline and stable Postop Assessment: no apparent nausea or vomiting Anesthetic complications: no   No notable events documented.   Last Vitals:  Vitals:   03/21/21 1000 03/21/21 1010  BP: (!) 82/53 116/88  Pulse: 65 65  Resp: 19 15  Temp:    SpO2: 100% 100%    Last Pain:  Vitals:   03/21/21 0817  TempSrc: Temporal  PainSc: 0-No pain                 Precious Haws Cicley Ganesh

## 2021-03-21 NOTE — Anesthesia Preprocedure Evaluation (Signed)
Anesthesia Evaluation  Patient identified by MRN, date of birth, ID band Patient awake    Reviewed: Allergy & Precautions, NPO status , Patient's Chart, lab work & pertinent test results  History of Anesthesia Complications Negative for: history of anesthetic complications  Airway Mallampati: III  TM Distance: <3 FB Neck ROM: full    Dental  (+) Upper Dentures, Lower Dentures   Pulmonary neg shortness of breath, former smoker,    Pulmonary exam normal        Cardiovascular (-) anginanegative cardio ROS Normal cardiovascular exam     Neuro/Psych negative neurological ROS  negative psych ROS   GI/Hepatic negative GI ROS, Neg liver ROS, neg GERD  ,  Endo/Other  negative endocrine ROS  Renal/GU negative Renal ROS  negative genitourinary   Musculoskeletal   Abdominal   Peds  Hematology negative hematology ROS (+)   Anesthesia Other Findings Past Medical History: No date: Hyperlipidemia  Past Surgical History: 07/20/2012: BREAST CYST ASPIRATION; Right     Comment:  FNA benign No date: BREAST CYST ASPIRATION; Right 07-20-12: BREAST SURGERY; Right     Comment:  FNA benign No date: CESAREAN SECTION No date: CHOLECYSTECTOMY No date: OVARY SURGERY  BMI    Body Mass Index: 26.29 kg/m      Reproductive/Obstetrics negative OB ROS                             Anesthesia Physical Anesthesia Plan  ASA: 2  Anesthesia Plan: General   Post-op Pain Management:    Induction: Intravenous  PONV Risk Score and Plan: Propofol infusion and TIVA  Airway Management Planned: Natural Airway and Nasal Cannula  Additional Equipment:   Intra-op Plan:   Post-operative Plan:   Informed Consent: I have reviewed the patients History and Physical, chart, labs and discussed the procedure including the risks, benefits and alternatives for the proposed anesthesia with the patient or authorized  representative who has indicated his/her understanding and acceptance.     Dental Advisory Given  Plan Discussed with: Anesthesiologist, CRNA and Surgeon  Anesthesia Plan Comments: (Patient consented for risks of anesthesia including but not limited to:  - adverse reactions to medications - risk of airway placement if required - damage to eyes, teeth, lips or other oral mucosa - nerve damage due to positioning  - sore throat or hoarseness - Damage to heart, brain, nerves, lungs, other parts of body or loss of life  Patient voiced understanding.)        Anesthesia Quick Evaluation

## 2021-03-21 NOTE — Op Note (Signed)
[facility] Gastroenterology Patient Name: Connie West Procedure Date: 03/21/2021 9:04 AM MRN: [id number] Account #: [id number] Date of Birth: 1955/08/13 Admit Type: Outpatient Age: 66 Room: [facility] ENDO ROOM 4 Gender: Female Note Status: Finalized Instrument Name: Colonoscope [id number] Procedure:             Colonoscopy Indications:           Last colonoscopy: March 2012, Positive Cologuard test Providers:             Lin Landsman MD, MD Referring MD:          Jinny Sanders MD, MD (Referring MD) Medicines:             General Anesthesia Complications:         No immediate complications. Estimated blood loss: None. Procedure:             Pre-Anesthesia Assessment:                        - Prior to the procedure, a History and Physical was                         performed, and patient medications and allergies were                         reviewed. The patient is competent. The risks and                         benefits of the procedure and the sedation options and                         risks were discussed with the patient. All questions                         were answered and informed consent was obtained.                         Patient identification and proposed procedure were                         verified by the physician, the nurse, the                         anesthesiologist, the anesthetist and the technician                         in the pre-procedure area in the procedure room in the                         endoscopy suite. Mental Status Examination: alert and                         oriented. Airway Examination: normal oropharyngeal                         airway and neck mobility. Respiratory Examination:                         clear to auscultation. CV Examination: normal.  Prophylactic Antibiotics: The patient does not require                         prophylactic antibiotics. Prior Anticoagulants: The                          patient has taken no previous anticoagulant or                         antiplatelet agents. ASA Grade Assessment: II - A                         patient with mild systemic disease. After reviewing                         the risks and benefits, the patient was deemed in                         satisfactory condition to undergo the procedure. The                         anesthesia plan was to use general anesthesia.                         Immediately prior to administration of medications,                         the patient was re-assessed for adequacy to receive                         sedatives. The heart rate, respiratory rate, oxygen                         saturations, blood pressure, adequacy of pulmonary                         ventilation, and response to care were monitored                         throughout the procedure. The physical status of the                         patient was re-assessed after the procedure.                        After obtaining informed consent, the colonoscope was                         passed under direct vision. Throughout the procedure,                         the patient's blood pressure, pulse, and oxygen                         saturations were monitored continuously. The                         Colonoscope was introduced through the anus and  advanced to the the cecum, identified by appendiceal                         orifice and ileocecal valve. The colonoscopy was                         unusually difficult due to significant looping and a                         tortuous colon. Successful completion of the procedure                         was aided by applying abdominal pressure. The patient                         tolerated the procedure well. The quality of the bowel                         preparation was evaluated using the BBPS [facility] Bowel                         Preparation Scale) with scores of: Right  Colon = 3,                         Transverse Colon = 3 and Left Colon = 3 (entire mucosa                         seen well with no residual staining, small fragments                         of stool or opaque liquid). The total BBPS score                         equals 9. Findings:      The perianal and digital rectal examinations were normal. Pertinent       negatives include normal sphincter tone and no palpable rectal lesions.      A 20 mm polyp was found in the cecum. The polyp was flat. Preparations       were made for mucosal resection. NBI was done to mark the borders of the       lesion. Eleview was injected with adequate lift of the lesion from the       muscularis propria. Snare mucosal resection with suction (via the       working channel) retrieval was performed. A 20 mm area was resected.       Resection and retrieval were complete. There was no bleeding during and       at the end of the procedure. To prevent bleeding after mucosal       resection, two hemostatic clips were successfully placed (MR       conditional). There was no bleeding during, or at the end, of the       procedure.      A 3 mm polyp was found in the cecum. The polyp was sessile. The polyp       was removed with a cold snare. Resection and retrieval were complete.       Estimated blood loss: none.  Three sessile polyps were found in the descending colon. The polyps were       4 to 5 mm in size. These polyps were removed with a cold snare.       Resection and retrieval were complete. Estimated blood loss: none.      Multiple diverticula were found in the recto-sigmoid colon and sigmoid       colon. There was no evidence of diverticular bleeding.      Non-bleeding external hemorrhoids were found during retroflexion. The       hemorrhoids were medium-sized. Impression:            - One 20 mm polyp in the cecum, removed with mucosal                         resection. Resected and retrieved. Clips (MR                          conditional) were placed.                        - One 3 mm polyp in the cecum, removed with a cold                         snare. Resected and retrieved.                        - Three 4 to 5 mm polyps in the descending colon,                         removed with a cold snare. Resected and retrieved.                        - Severe diverticulosis in the recto-sigmoid colon and                         in the sigmoid colon. There was no evidence of                         diverticular bleeding.                        - Non-bleeding external hemorrhoids.                        - Mucosal resection was performed. Resection and                         retrieval were complete. Recommendation:        - Discharge patient to home (with escort).                        - Resume previous diet today.                        - Continue present medications.                        - Await pathology results.                        -  Repeat colonoscopy in 1 year for surveillance after                         piecemeal polypectomy. Procedure Code(s):     --- Professional ---                        [phone], Colonoscopy, flexible; with endoscopic mucosal                         resection                        45385, 59, Colonoscopy, flexible; with removal of                         tumor(s), polyp(s), or other lesion(s) by snare                         technique Diagnosis Code(s):     --- Professional ---                        K63.5, Polyp of colon                        K64.4, Residual hemorrhoidal skin tags                        R19.5, Other fecal abnormalities                        K57.30, Diverticulosis of large intestine without                         perforation or abscess without bleeding CPT copyright 2019 American Medical Association. All rights reserved. The codes documented in this report are preliminary and upon coder review may  be revised to meet current compliance  requirements. Dr. Ulyess Mort Lin Landsman MD, MD 03/21/2021 10:00:27 AM This report has been signed electronically. Number of Addenda: 0 Note Initiated On: 03/21/2021 9:04 AM Scope Withdrawal Time: 0 hours 26 minutes 49 seconds  Total Procedure Duration: 0 hours 40 minutes 30 seconds  Estimated Blood Loss:  Estimated blood loss: none.      [facility]

## 2021-03-22 ENCOUNTER — Encounter: Payer: Self-pay | Admitting: Gastroenterology

## 2021-03-22 LAB — SURGICAL PATHOLOGY

## 2021-04-28 ENCOUNTER — Other Ambulatory Visit: Payer: Self-pay | Admitting: Family Medicine

## 2021-06-15 ENCOUNTER — Telehealth: Payer: Medicare Other | Admitting: Physician Assistant

## 2021-06-15 DIAGNOSIS — R3989 Other symptoms and signs involving the genitourinary system: Secondary | ICD-10-CM

## 2021-06-15 MED ORDER — SULFAMETHOXAZOLE-TRIMETHOPRIM 800-160 MG PO TABS: 1.0000 | ORAL_TABLET | Freq: Two times a day (BID) | ORAL | 0 refills | Status: DC

## 2021-06-15 NOTE — Progress Notes (Signed)

## 2021-10-21 DIAGNOSIS — M25532 Pain in left wrist: Secondary | ICD-10-CM | POA: Diagnosis not present

## 2021-10-21 DIAGNOSIS — Z743 Need for continuous supervision: Secondary | ICD-10-CM | POA: Diagnosis not present

## 2021-10-21 DIAGNOSIS — T1490XA Injury, unspecified, initial encounter: Secondary | ICD-10-CM | POA: Diagnosis not present

## 2021-10-21 DIAGNOSIS — S52502A Unspecified fracture of the lower end of left radius, initial encounter for closed fracture: Secondary | ICD-10-CM | POA: Diagnosis not present

## 2021-10-21 DIAGNOSIS — S199XXA Unspecified injury of neck, initial encounter: Secondary | ICD-10-CM | POA: Diagnosis not present

## 2021-10-21 DIAGNOSIS — S0990XA Unspecified injury of head, initial encounter: Secondary | ICD-10-CM | POA: Diagnosis not present

## 2021-10-21 DIAGNOSIS — S52612A Displaced fracture of left ulna styloid process, initial encounter for closed fracture: Secondary | ICD-10-CM | POA: Diagnosis not present

## 2021-10-21 DIAGNOSIS — Z7901 Long term (current) use of anticoagulants: Secondary | ICD-10-CM | POA: Diagnosis not present

## 2021-10-21 DIAGNOSIS — I10 Essential (primary) hypertension: Secondary | ICD-10-CM | POA: Diagnosis not present

## 2021-10-25 ENCOUNTER — Ambulatory Visit (INDEPENDENT_AMBULATORY_CARE_PROVIDER_SITE_OTHER): Payer: Medicare Other | Admitting: Family Medicine

## 2021-10-25 ENCOUNTER — Encounter: Payer: Self-pay | Admitting: Family Medicine

## 2021-10-25 VITALS — BP 120/80 | HR 84 | Temp 98.3°F | Ht 64.65 in | Wt 170.2 lb

## 2021-10-25 DIAGNOSIS — S52612D Displaced fracture of left ulna styloid process, subsequent encounter for closed fracture with routine healing: Secondary | ICD-10-CM

## 2021-10-25 DIAGNOSIS — S52502D Unspecified fracture of the lower end of left radius, subsequent encounter for closed fracture with routine healing: Secondary | ICD-10-CM | POA: Diagnosis not present

## 2021-10-25 DIAGNOSIS — W108XXD Fall (on) (from) other stairs and steps, subsequent encounter: Secondary | ICD-10-CM

## 2021-10-25 NOTE — Progress Notes (Signed)
Patient ID: Connie West, female    DOB: [birth year], 66 y.o.   MRN: [id number]  This visit was conducted in person.  BP 120/80   Pulse 84   Temp 98.3 F (36.8 C) (Oral)   Ht 5' 4.65" (1.642 m)   Wt 170 lb 4 oz (77.2 kg)   SpO2 98%   BMI 28.64 kg/m    CC:  Chief Complaint  Patient presents with   Wrist Injury    Fell in [place] on Sunday and fractured wrist    Subjective:   HPI: Connie West is a 66 y.o. female presenting on 10/25/2021 for Wrist Injury [employer] in [place] on Sunday and fractured wrist)  She reports accidental fall 5 days ago in [place].  She l missed 2 steps and landed on her  left wrist. Noted pain and swelling immediately.  No CP, no SOB, no lightheadedness, no palpitations.  Went to ER .. X-rays of arm and CT scan of neck.head given hit head as well. Reviewed: CT head without contrast No acute intracranial hemorrhage or mass effect. CT cervical spine without contrast 1.  No acute fracture or traumatic subluxation of the cervical spine.  2.  Multilevel spinal degenerative changes. No severe spinal canal stenosis.  X-ray wrist left 3+ views AP, lateral, and oblique radiographs of the left wrist demonstrate an acute comminuted impacted fracture of the distal radius, with mild dorsal angulation of the distal fracture fragments Mildly displaced fracture of the ulnar styloid. Soft tissue swelling surrounding the distal forearm.   Placed in sugartong splint and sling.   She needs a referral for ORTHO.  [employer].   She is using ibuprofen '400mg'$  once daily for pain... manageable.  Did not need any today. Some swelling in fingers but normal sensation and temperature.   Relevant past medical, surgical, family and social history reviewed and updated as indicated. Interim medical history since our last visit reviewed. Allergies and medications reviewed and updated. Outpatient Medications Prior to Visit  Medication Sig Dispense Refill    aspirin EC 81 MG tablet Take 81 mg by mouth daily.     calcium carbonate (OS-CAL) 600 MG TABS Take 600 mg by mouth daily.     cholecalciferol (VITAMIN D) 1000 UNITS tablet Take 1,000 Units by mouth daily.     Coenzyme Q10 (CO Q 10) 100 MG CAPS Take 1 capsule by mouth daily.     fish oil-omega-3 fatty acids 1000 MG capsule Take 1 g by mouth daily.     ibuprofen (ADVIL,MOTRIN) 200 MG tablet Take 200 mg by mouth every 6 (six) hours as needed.     simvastatin (ZOCOR) 40 MG tablet TAKE 1 TABLET BY MOUTH EVERY DAY 90 tablet 2   sulfamethoxazole-trimethoprim (BACTRIM DS) 800-160 MG tablet Take 1 tablet by mouth 2 (two) times daily. 10 tablet 0   No facility-administered medications prior to visit.     Per HPI unless specifically indicated in ROS section below Review of Systems Objective:  BP 120/80   Pulse 84   Temp 98.3 F (36.8 C) (Oral)   Ht 5' 4.65" (1.642 m)   Wt 170 lb 4 oz (77.2 kg)   SpO2 98%   BMI 28.64 kg/m   Wt Readings from Last 3 Encounters:  10/25/21 170 lb 4 oz (77.2 kg)  03/21/21 158 lb (71.7 kg)  02/01/21 159 lb 2 oz (72.2 kg)      Physical Exam Constitutional:      General: She  is not in acute distress.    Appearance: Normal appearance. She is well-developed. She is not ill-appearing or toxic-appearing.  HENT:     Head: Normocephalic.     Right Ear: Hearing, tympanic membrane, ear canal and external ear normal. Tympanic membrane is not erythematous, retracted or bulging.     Left Ear: Hearing, tympanic membrane, ear canal and external ear normal. Tympanic membrane is not erythematous, retracted or bulging.     Nose: No mucosal edema or rhinorrhea.     Right Sinus: No maxillary sinus tenderness or frontal sinus tenderness.     Left Sinus: No maxillary sinus tenderness or frontal sinus tenderness.     Mouth/Throat:     Pharynx: Uvula midline.  Eyes:     General: Lids are normal. Lids are everted, no foreign bodies appreciated.     Conjunctiva/sclera: Conjunctivae  normal.     Pupils: Pupils are equal, round, and reactive to light.  Neck:     Thyroid: No thyroid mass or thyromegaly.     Vascular: No carotid bruit.     Trachea: Trachea normal.  Cardiovascular:     Rate and Rhythm: Normal rate and regular rhythm.     Pulses: Normal pulses.     Heart sounds: Normal heart sounds, S1 normal and S2 normal. No murmur heard.    No friction rub. No gallop.  Pulmonary:     Effort: Pulmonary effort is normal. No tachypnea or respiratory distress.     Breath sounds: Normal breath sounds. No decreased breath sounds, wheezing, rhonchi or rales.  Abdominal:     General: Bowel sounds are normal.     Palpations: Abdomen is soft.     Tenderness: There is no abdominal tenderness.  Musculoskeletal:     Cervical back: Normal range of motion and neck supple.     Comments: Left forearm in sugar-tong splint and sling.  Able to move fingers and normal sensation and temperature.  Skin:    General: Skin is warm and dry.     Findings: No rash.  Neurological:     Mental Status: She is alert.  Psychiatric:        Mood and Affect: Mood is not anxious or depressed.        Speech: Speech normal.        Behavior: Behavior normal. Behavior is cooperative.        Thought Content: Thought content normal.        Judgment: Judgment normal.       Results for orders placed or performed during the hospital encounter of 03/21/21  Surgical pathology  Result Value Ref Range   SURGICAL PATHOLOGY      SURGICAL PATHOLOGY CASE: [id number] PATIENT: Orlene Och Surgical Pathology Report     Specimen Submitted: A. Colon polyp x2, cecum; cs(1) hs(1) B. Colon polyp x3, descending; cold snare  Clinical History: Positive cologuard test.  Diverticulosis, colon polyps, external hemorrhoids      DIAGNOSIS: A. COLON POLYP X2, CECUM; COLD SNARE AND HOT SNARE: - SESSILE SERRATED POLYP, MULTIPLE FRAGMENTS. - NEGATIVE FOR DYSPLASIA AND MALIGNANCY.  B. COLON POLYP X3,  DESCENDING; COLD SNARE: - TUBULAR ADENOMA, ONE FRAGMENT. - POLYPOID BENIGN COLONIC MUCOSA, MULTIPLE FRAGMENTS. - NEGATIVE FOR HIGH GRADE DYSPLASIA AND MALIGNANCY.   GROSS DESCRIPTION: A. Labeled: Cold snare polyp x1/hot snare polyp x1 cecum Received: Formalin Collection time: 9:33 AM on 03/21/2021 Placed into formalin time: 9:33 AM on 03/21/2021 Tissue fragment(s): Multiple Size: Aggregate, 2.2 x 0.7 x 0.4  cm Description: Received are fragments of gray to tan soft tissue admixed with a scant amount of  intestinal debris.  The ratio of soft tissue to intestinal debris is 95: 5.  The largest soft tissue fragment has a resection margin which is inked blue.  This fragment is trisected. Entirely submitted in cassettes 1-2 with the trisected fragment in cassette 1 and the remaining fragments in cassette 2.  B. Labeled: Cold snare polyp x3 descending colon Received: Formalin Collection time: 9:52 AM on 03/21/2021 Placed into formalin time: 9:52 AM on 03/21/2021 Tissue fragment(s): Multiple Size: Aggregate, 2.2 x 0.5 x 0.2 cm Description: Received are fragments of pink-white soft tissue admixed with intestinal debris.  The ratio of soft tissue to intestinal debris is 80: 20. Entirely submitted in 1 cassette.  RB 03/21/2021  Final Diagnosis performed by Betsy Pries, MD.   Electronically signed 03/22/2021 3:18:36PM The electronic signature indicates that the named Attending Pathologist has evaluated the specimen Technical component performed at [facility], [address] [place], [place] [address] Lab: [phone] Dir: Rush Farmer, MD, MMM  Professional component performed at [facility], [facility], [place], [place], [place] [address] Lab: [phone] Dir: Kathi Simpers, MD      COVID 19 screen:  No recent travel or known exposure to [place] The patient denies respiratory symptoms of COVID 19 at this time. The importance of social distancing was discussed  today.   Assessment and Plan    Problem List Items Addressed This Visit   None Visit Diagnoses     Closed fracture of distal end of left radius with routine healing, unspecified fracture morphology, subsequent encounter    -  Primary   Relevant Orders   Ambulatory referral to Orthopedic Surgery   Closed displaced fracture of styloid process of left ulna with routine healing, subsequent encounter       Relevant Orders   Ambulatory referral to Orthopedic Surgery   Accidental fall on or from stairs or steps, subsequent encounter            Fall was accidental.  No preceding symptoms.  She will move forward with referral to orthopedics for definitive treatment.  Continue with sugar-tong splint and sling.  Eliezer Lofts, MD

## 2021-10-26 DIAGNOSIS — S6292XA Unspecified fracture of left wrist and hand, initial encounter for closed fracture: Secondary | ICD-10-CM | POA: Diagnosis not present

## 2021-11-02 DIAGNOSIS — S52532A Colles' fracture of left radius, initial encounter for closed fracture: Secondary | ICD-10-CM | POA: Diagnosis not present

## 2021-11-02 DIAGNOSIS — S52539A Colles' fracture of unspecified radius, initial encounter for closed fracture: Secondary | ICD-10-CM | POA: Insufficient documentation

## 2021-11-15 IMAGING — MG DIGITAL SCREENING BILAT W/ TOMO W/ CAD
8 series · 8 of 24 positions shown · non-contrast
Comparison: Previous exam(s).

CLINICAL DATA: Screening.

EXAM:
DIGITAL SCREENING BILATERAL MAMMOGRAM WITH TOMO AND CAD

[R CC synth-2D]
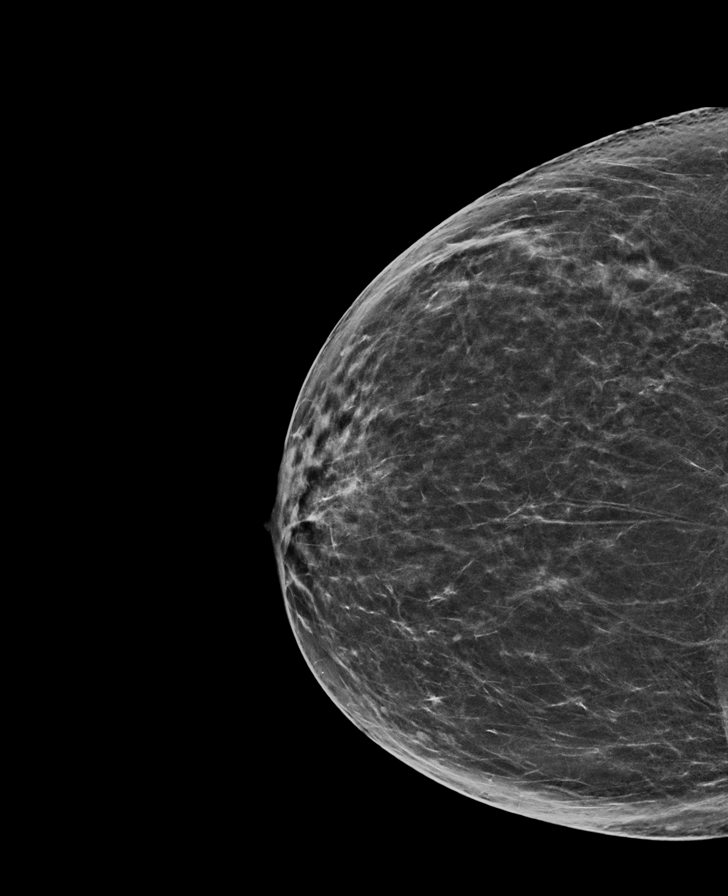

[L CC synth-2D]
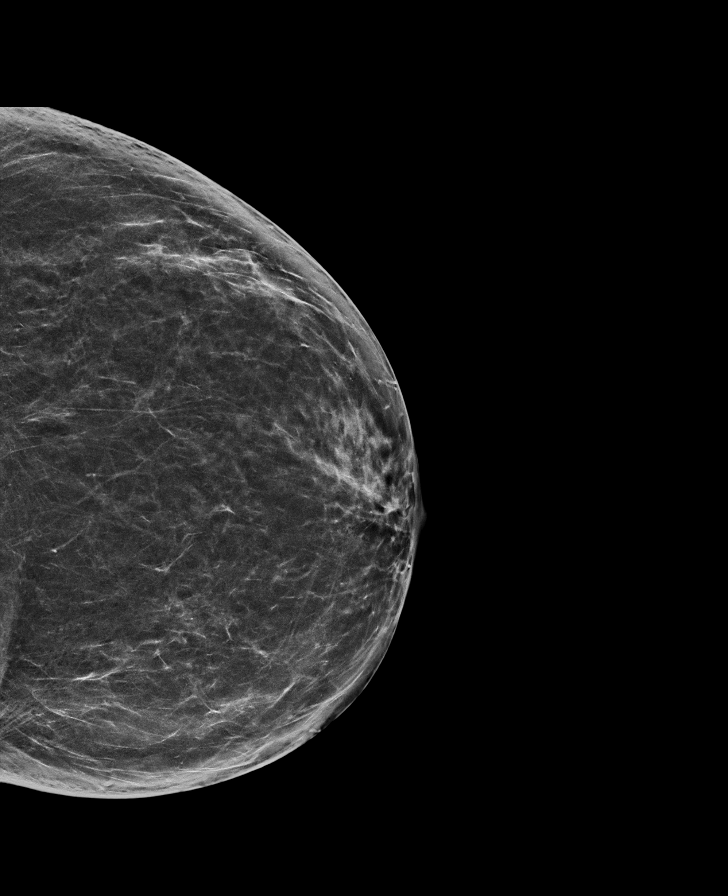

[R MLO synth-2D]
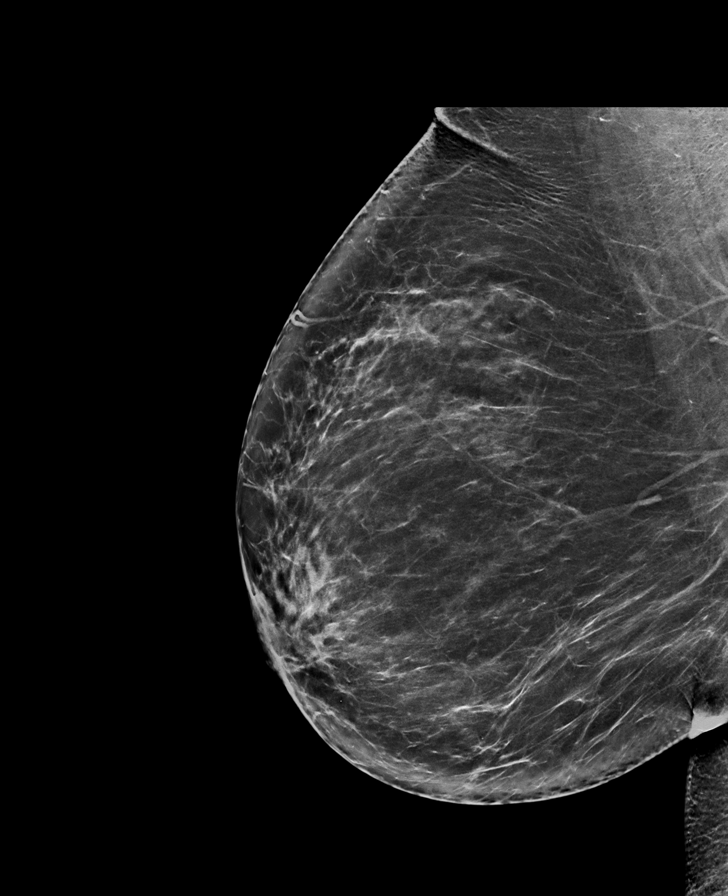

[L MLO synth-2D]
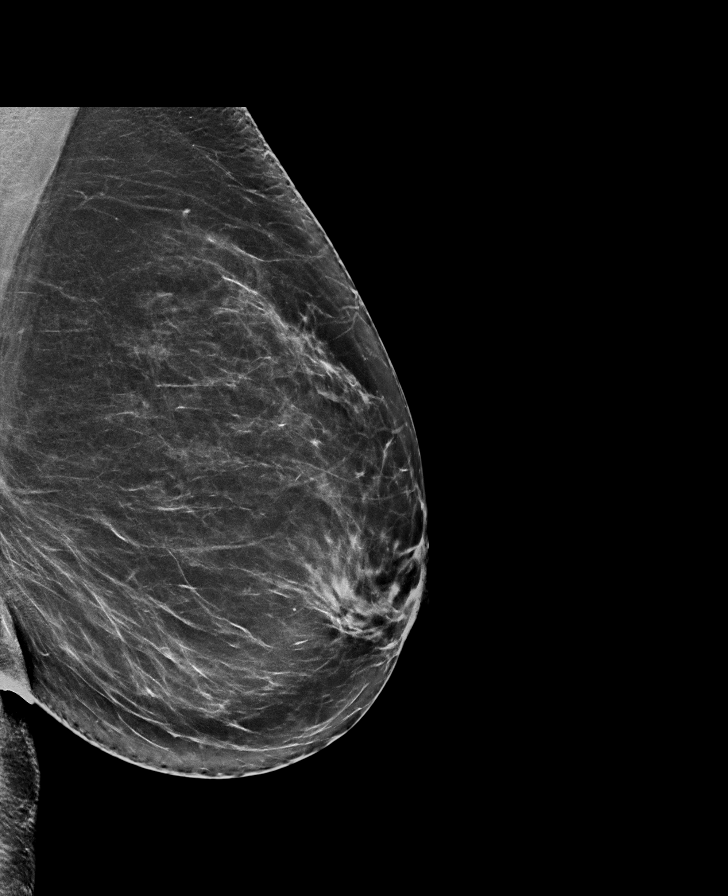

[R MLO tomo · tomo slice 39/78.0]
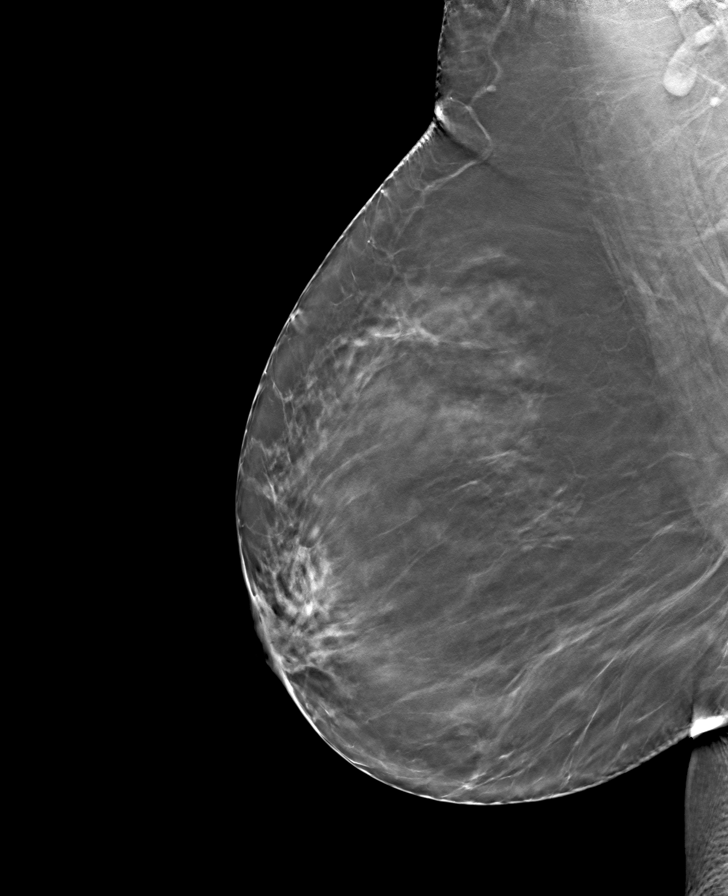

[L CC tomo · tomo slice 36/71.0]
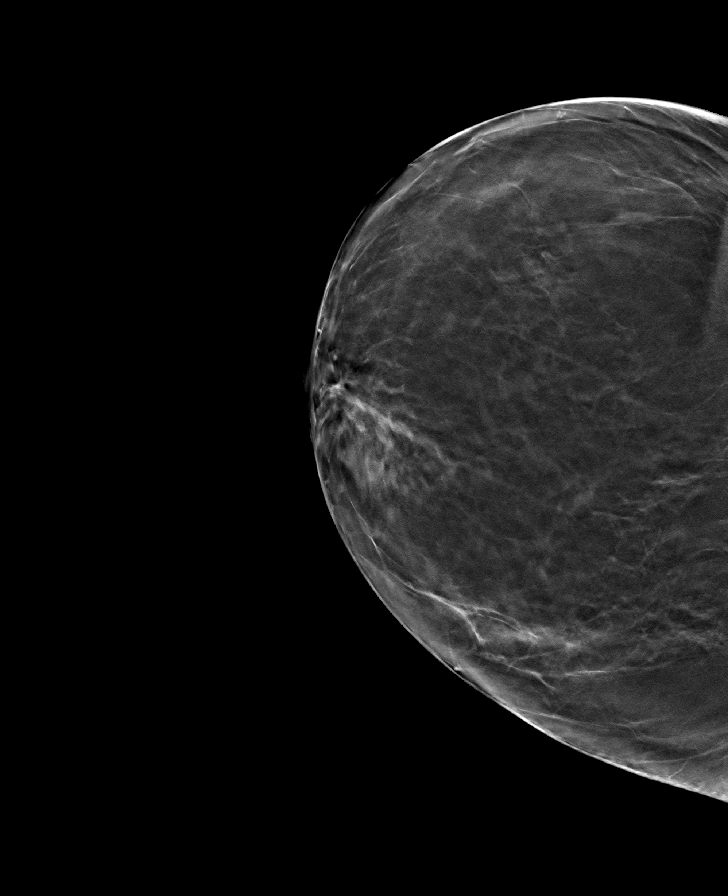

[R CC tomo · tomo slice 34/67.0]
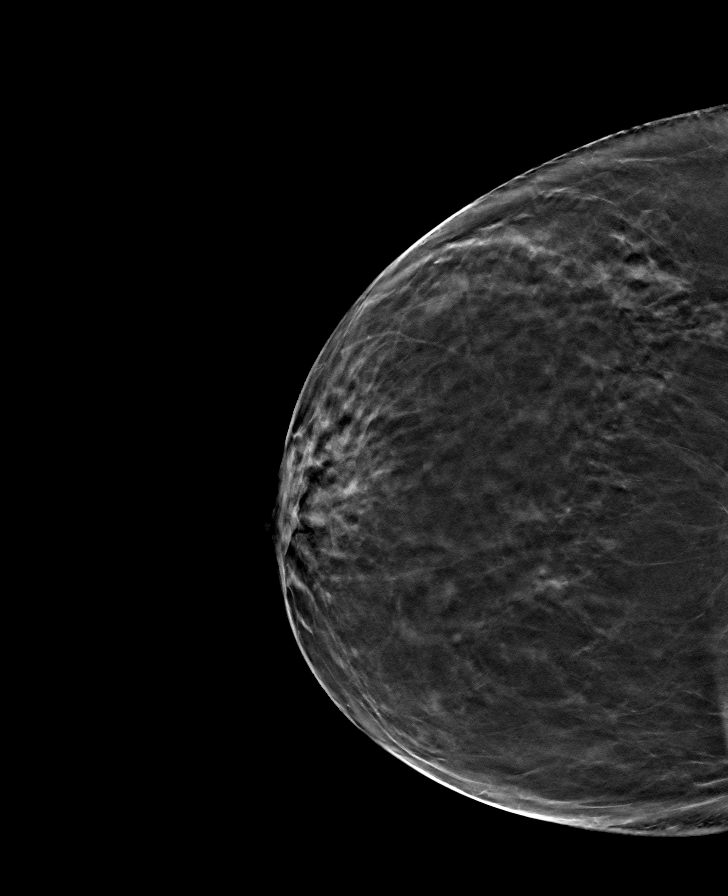

[L MLO tomo · tomo slice 41/80.0]
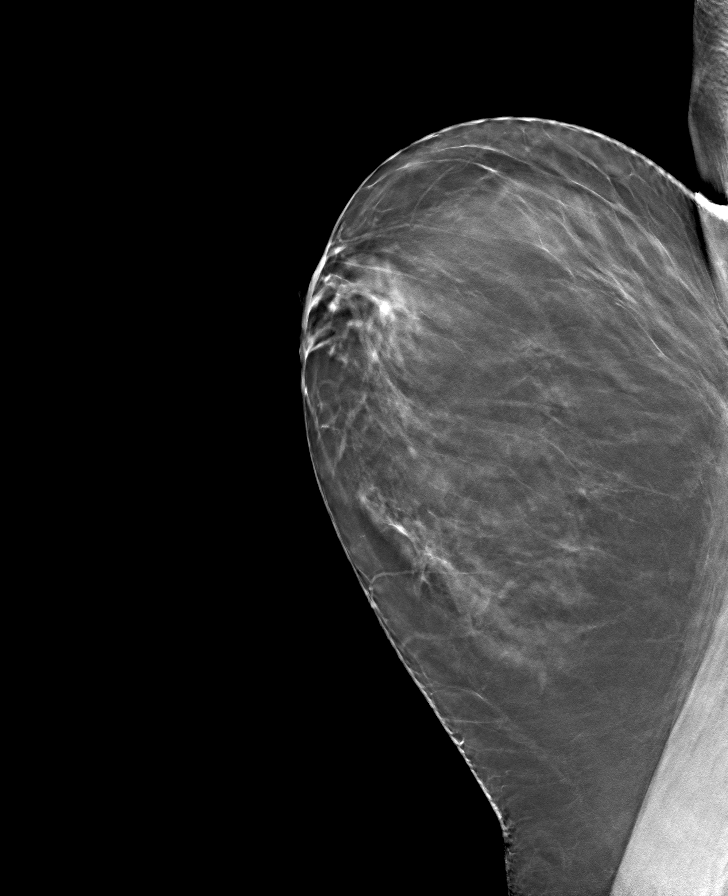

[8 of 24 positions shown; findings below may reference images not displayed]

ACR Breast Density Category b: There are scattered areas of
fibroglandular density.
FINDINGS: There are no findings suspicious for malignancy. Images were
processed with CAD.
IMPRESSION: No mammographic evidence of malignancy. A result letter of this
screening mammogram will be mailed directly to the patient.

RECOMMENDATION:
Screening mammogram in one year. (Code:[id number])

BI-RADS CATEGORY  1: Negative.

## 2021-11-23 DIAGNOSIS — S52532A Colles' fracture of left radius, initial encounter for closed fracture: Secondary | ICD-10-CM | POA: Diagnosis not present

## 2021-12-10 ENCOUNTER — Encounter (INDEPENDENT_AMBULATORY_CARE_PROVIDER_SITE_OTHER): Payer: Self-pay

## 2021-12-14 DIAGNOSIS — S52532A Colles' fracture of left radius, initial encounter for closed fracture: Secondary | ICD-10-CM | POA: Diagnosis not present

## 2021-12-24 DIAGNOSIS — S52532D Colles' fracture of left radius, subsequent encounter for closed fracture with routine healing: Secondary | ICD-10-CM | POA: Diagnosis not present

## 2022-01-14 ENCOUNTER — Telehealth: Payer: Self-pay | Admitting: Family Medicine

## 2022-01-14 DIAGNOSIS — R7303 Prediabetes: Secondary | ICD-10-CM

## 2022-01-14 DIAGNOSIS — E7800 Pure hypercholesterolemia, unspecified: Secondary | ICD-10-CM

## 2022-01-14 DIAGNOSIS — E559 Vitamin D deficiency, unspecified: Secondary | ICD-10-CM

## 2022-01-14 NOTE — Telephone Encounter (Signed)
-----   Message from Velna Hatchet, RT sent at 01/07/2022  9:43 AM EST ----- Regarding: Mon 12/11 Patient is scheduled for cpx, please order future labs.  Thanks, Anda Kraft

## 2022-01-16 DIAGNOSIS — S52532D Colles' fracture of left radius, subsequent encounter for closed fracture with routine healing: Secondary | ICD-10-CM | POA: Diagnosis not present

## 2022-01-19 ENCOUNTER — Other Ambulatory Visit: Payer: Self-pay | Admitting: Family Medicine

## 2022-01-21 ENCOUNTER — Other Ambulatory Visit (INDEPENDENT_AMBULATORY_CARE_PROVIDER_SITE_OTHER): Payer: Medicare Other

## 2022-01-21 DIAGNOSIS — E559 Vitamin D deficiency, unspecified: Secondary | ICD-10-CM | POA: Diagnosis not present

## 2022-01-21 DIAGNOSIS — E7800 Pure hypercholesterolemia, unspecified: Secondary | ICD-10-CM | POA: Diagnosis not present

## 2022-01-21 DIAGNOSIS — R7303 Prediabetes: Secondary | ICD-10-CM

## 2022-01-21 LAB — LIPID PANEL
Cholesterol: 195 mg/dL (ref 0–200)
HDL: 66.4 mg/dL (ref >39.00)
LDL Cholesterol: 96 mg/dL (ref 0–99)
NonHDL: 128.14
Total CHOL/HDL Ratio: 3
Triglycerides: 163 mg/dL — ABNORMAL HIGH (ref 0.0–149.0)
VLDL: 32.6 mg/dL (ref 0.0–40.0)

## 2022-01-21 LAB — COMPREHENSIVE METABOLIC PANEL
ALT: 19 U/L (ref 0–35)
AST: 22 U/L (ref 0–37)
Albumin: 4.5 g/dL (ref 3.5–5.2)
Alkaline Phosphatase: 81 U/L (ref 39–117)
BUN: 14 mg/dL (ref 6–23)
CO2: 27 mEq/L (ref 19–32)
Calcium: 10 mg/dL (ref 8.4–10.5)
Chloride: 101 mEq/L (ref 96–112)
Creatinine, Ser: 0.97 mg/dL (ref 0.40–1.20)
GFR: 60.96 mL/min (ref >60.00)
Glucose, Bld: 109 mg/dL — ABNORMAL HIGH (ref 70–99)
Potassium: 4.2 mEq/L (ref 3.5–5.1)
Sodium: 138 mEq/L (ref 135–145)
Total Bilirubin: 0.5 mg/dL (ref 0.2–1.2)
Total Protein: 7.4 g/dL (ref 6.0–8.3)

## 2022-01-21 LAB — HEMOGLOBIN A1C: Hgb A1c MFr Bld: 5.9 % (ref 4.6–6.5)

## 2022-01-21 LAB — VITAMIN D 25 HYDROXY (VIT D DEFICIENCY, FRACTURES): VITD: 39.75 ng/mL (ref 30.00–100.00)

## 2022-01-22 NOTE — Progress Notes (Signed)
No critical labs need to be addressed urgently. We will discuss labs in detail at upcoming office visit.   

## 2022-01-28 ENCOUNTER — Telehealth: Payer: Self-pay | Admitting: Family Medicine

## 2022-01-28 NOTE — Telephone Encounter (Signed)
LVM for pt to rtn my call to schedule AWV with [facility] call back # [phone] 

## 2022-01-30 DIAGNOSIS — S52532D Colles' fracture of left radius, subsequent encounter for closed fracture with routine healing: Secondary | ICD-10-CM | POA: Diagnosis not present

## 2022-02-01 ENCOUNTER — Ambulatory Visit (INDEPENDENT_AMBULATORY_CARE_PROVIDER_SITE_OTHER): Payer: Medicare Other | Admitting: Family Medicine

## 2022-02-01 ENCOUNTER — Encounter: Payer: Self-pay | Admitting: Family Medicine

## 2022-02-01 VITALS — BP 118/80 | HR 98 | Temp 98.6°F | Ht 65.0 in | Wt 168.0 lb

## 2022-02-01 DIAGNOSIS — R5383 Other fatigue: Secondary | ICD-10-CM

## 2022-02-01 DIAGNOSIS — E7800 Pure hypercholesterolemia, unspecified: Secondary | ICD-10-CM

## 2022-02-01 DIAGNOSIS — R7303 Prediabetes: Secondary | ICD-10-CM | POA: Diagnosis not present

## 2022-02-01 DIAGNOSIS — Z0000 Encounter for general adult medical examination without abnormal findings: Secondary | ICD-10-CM | POA: Diagnosis not present

## 2022-02-01 DIAGNOSIS — E559 Vitamin D deficiency, unspecified: Secondary | ICD-10-CM | POA: Diagnosis not present

## 2022-02-01 DIAGNOSIS — M8580 Other specified disorders of bone density and structure, unspecified site: Secondary | ICD-10-CM

## 2022-02-01 DIAGNOSIS — Z87891 Personal history of nicotine dependence: Secondary | ICD-10-CM

## 2022-02-01 LAB — TSH: TSH: 1.48 u[IU]/mL (ref 0.35–5.50)

## 2022-02-01 LAB — CBC WITH DIFFERENTIAL/PLATELET
Basophils Absolute: 0 10*3/uL (ref 0.0–0.1)
Basophils Relative: 0.5 % (ref 0.0–3.0)
Eosinophils Absolute: 0.3 10*3/uL (ref 0.0–0.7)
Eosinophils Relative: 3.6 % (ref 0.0–5.0)
HCT: 40.9 % (ref 36.0–46.0)
Hemoglobin: 13.8 g/dL (ref 12.0–15.0)
Lymphocytes Relative: 27.1 % (ref 12.0–46.0)
Lymphs Abs: 1.9 10*3/uL (ref 0.7–4.0)
MCHC: 33.8 g/dL (ref 30.0–36.0)
MCV: 86.4 fl (ref 78.0–100.0)
Monocytes Absolute: 0.6 10*3/uL (ref 0.1–1.0)
Monocytes Relative: 9.1 % (ref 3.0–12.0)
Neutro Abs: 4.3 10*3/uL (ref 1.4–7.7)
Neutrophils Relative %: 59.7 % (ref 43.0–77.0)
Platelets: 330 10*3/uL (ref 150.0–400.0)
RBC: 4.74 Mil/uL (ref 3.87–5.11)
RDW: 13 % (ref 11.5–15.5)
WBC: 7.2 10*3/uL (ref 4.0–10.5)

## 2022-02-01 NOTE — Patient Instructions (Addendum)
Please stop at the lab to have labs drawn.  Work on low [employer], sweets.  Get back on track with exercise.Marland Kitchen 3-5 days a week 30-50 min per time. Consider lab follow up for cholesterol in 3-6 months.  We will set you up in the lung cancer screening program.  Please call the location of your choice from the menu below to schedule your Mammogram and/or Bone Density appointment.     [place] at [facility]   Phone:  [phone]   [place], [place] [address]                                            Services: 3D Mammogram and [place]  [place] at [facility])  Phone:  [phone]   [address]                        [place], [place] [address]                                              Services:  3D Mammogram and Bone Density    DS

## 2022-02-01 NOTE — Assessment & Plan Note (Signed)
Chronic, slight worsening.  She will work on low carbohydrate diet.  She will return to regular exercise.

## 2022-02-01 NOTE — Progress Notes (Signed)
Patient ID: Connie West, female    DOB: February 14, 1955, 66 y.o.   MRN: [id number]  This visit was conducted in person.  BP 118/80   Pulse 98   Temp 98.6 F (37 C) (Oral)   Ht '5\' 5"'$  (1.651 m)   Wt 168 lb (76.2 kg)   SpO2 98%   BMI 27.96 kg/m    CC:  Chief Complaint  Patient presents with   Medicare Wellness    Subjective:   HPI: Connie West is a 66 y.o. female presenting on 02/01/2022 for Medicare Wellness  The patient presents for annual medicare wellness, complete physical and review of chronic health problems. He/She also has the following acute concerns today: in last few months she is feeling more tired. She has no bleeding, no depression., She does snore.   Has to nap during the day. No AM headache.  Sleeps  well at night 6-7 hours a night.   I have personally reviewed the Medicare Annual Wellness questionnaire and have noted 1. The patient's medical and social history 2. Their use of alcohol, tobacco or illicit drugs 3. Their current medications and supplements 4. The patient's functional ability including ADL's, fall risks, home safety risks and hearing or visual             impairment. 5. Diet and physical activities 6. Evidence for depression or mood disorders 7.         Updated provider list Cognitive evaluation was performed and recorded on pt medicare questionnaire form. The patients weight, height, BMI and visual acuity have been recorded in the chart   I have made referrals, counseling and provided education to the patient based review of the above and I have provided the pt with a written personalized care plan for preventive services.   Documentation of this information was scanned into the electronic record under the media tab.   Advance directives and end of life planning reviewed in detail with patient and documented in EMR. Patient given handout on advance care directives if needed. HCPOA and living will updated if needed.  Hearing Screening   Method: Audiometry   '500Hz'$  '1000Hz'$  '2000Hz'$  '4000Hz'$   Right ear '20 20 20 20  '$ Left ear '20 20 20 20   '$ Vision Screening   Right eye Left eye Both eyes  Without correction     With correction '20/15 20/15 20/15 '$    No falls in last 12 months.  [place] Visit from 02/01/2022 in [place] at [facility] Total Score 0       Elevated Cholesterol:  Hx of CVA, on simvastatin  40 mg daily. Lab Results  Component Value Date   CHOL 195 01/21/2022   HDL 66.40 01/21/2022   LDLCALC 96 01/21/2022   LDLDIRECT 111.0 02/01/2021   TRIG 163.0 (H) 01/21/2022   CHOLHDL 3 01/21/2022  Using medications without problems: Muscle aches:  Diet compliance: heart healthy diet Exercise:  walking off and on Other complaints:   Prediabetes  Lab Results  Component Value Date   HGBA1C 5.9 01/21/2022   Vit D:  at goal.     Relevant past medical, surgical, family and social history reviewed and updated as indicated. Interim medical history since our last visit reviewed. Allergies and medications reviewed and updated. Outpatient Medications Prior to Visit  Medication Sig Dispense Refill   aspirin EC 81 MG tablet Take 81 mg by mouth daily.     calcium carbonate (OS-CAL) 600 MG TABS  Take 600 mg by mouth daily.     cholecalciferol (VITAMIN D) 1000 UNITS tablet Take 1,000 Units by mouth daily.     Coenzyme Q10 (CO Q 10) 100 MG CAPS Take 1 capsule by mouth daily.     fish oil-omega-3 fatty acids 1000 MG capsule Take 1 g by mouth daily.     ibuprofen (ADVIL,MOTRIN) 200 MG tablet Take 200 mg by mouth every 6 (six) hours as needed.     simvastatin (ZOCOR) 40 MG tablet TAKE 1 TABLET BY MOUTH EVERY DAY 90 tablet 0   No facility-administered medications prior to visit.     Per HPI unless specifically indicated in ROS section below Review of Systems  Constitutional:  Positive for fatigue. Negative for fever.  HENT:  Negative for congestion.   Eyes:  Negative for pain.  Respiratory:   Negative for cough and shortness of breath.   Cardiovascular:  Negative for chest pain, palpitations and leg swelling.  Gastrointestinal:  Negative for abdominal pain.  Genitourinary:  Negative for dysuria and vaginal bleeding.  Musculoskeletal:  Negative for back pain.  Neurological:  Negative for syncope, light-headedness and headaches.  Psychiatric/Behavioral:  Negative for dysphoric mood.    Objective:  BP 118/80   Pulse 98   Temp 98.6 F (37 C) (Oral)   Ht '5\' 5"'$  (1.651 m)   Wt 168 lb (76.2 kg)   SpO2 98%   BMI 27.96 kg/m   Wt Readings from Last 3 Encounters:  02/01/22 168 lb (76.2 kg)  10/25/21 170 lb 4 oz (77.2 kg)  03/21/21 158 lb (71.7 kg)      Physical Exam Vitals and nursing note reviewed.  Constitutional:      General: She is not in acute distress.    Appearance: Normal appearance. She is well-developed. She is not ill-appearing or toxic-appearing.  HENT:     Head: Normocephalic.     Right Ear: Hearing, tympanic membrane, ear canal and external ear normal.     Left Ear: Hearing, tympanic membrane, ear canal and external ear normal.     Nose: Nose normal.  Eyes:     General: Lids are normal. Lids are everted, no foreign bodies appreciated.     Conjunctiva/sclera: Conjunctivae normal.     Pupils: Pupils are equal, round, and reactive to light.  Neck:     Thyroid: No thyroid mass or thyromegaly.     Vascular: No carotid bruit.     Trachea: Trachea normal.  Cardiovascular:     Rate and Rhythm: Normal rate and regular rhythm.     Heart sounds: Normal heart sounds, S1 normal and S2 normal. No murmur heard.    No gallop.  Pulmonary:     Effort: Pulmonary effort is normal. No respiratory distress.     Breath sounds: Normal breath sounds. No wheezing, rhonchi or rales.  Chest:     Comments: Excess fatty tissue in the right supraclavicular space.  No lymph nodes palpated. Abdominal:     General: Bowel sounds are normal. There is no distension or abdominal bruit.      Palpations: Abdomen is soft. There is no fluid wave or mass.     Tenderness: There is no abdominal tenderness. There is no guarding or rebound.     Hernia: No hernia is present.  Musculoskeletal:     Cervical back: Normal range of motion and neck supple.  Lymphadenopathy:     Cervical: No cervical adenopathy.  Skin:    General: Skin is warm  and dry.     Findings: No rash.  Neurological:     Mental Status: She is alert.     Cranial Nerves: No cranial nerve deficit.     Sensory: No sensory deficit.  Psychiatric:        Mood and Affect: Mood is not anxious or depressed.        Speech: Speech normal.        Behavior: Behavior normal. Behavior is cooperative.        Judgment: Judgment normal.       Results for orders placed or performed in visit on 01/21/22  VITAMIN D 25 Hydroxy (Vit-D Deficiency, Fractures)  Result Value Ref Range   VITD 39.75 30.00 - 100.00 ng/mL  Comprehensive metabolic panel  Result Value Ref Range   Sodium 138 135 - 145 mEq/L   Potassium 4.2 3.5 - 5.1 mEq/L   Chloride 101 96 - 112 mEq/L   CO2 27 19 - 32 mEq/L   Glucose, Bld 109 (H) 70 - 99 mg/dL   BUN 14 6 - 23 mg/dL   Creatinine, Ser 0.97 0.40 - 1.20 mg/dL   Total Bilirubin 0.5 0.2 - 1.2 mg/dL   Alkaline Phosphatase 81 39 - 117 U/L   AST 22 0 - 37 U/L   ALT 19 0 - 35 U/L   Total Protein 7.4 6.0 - 8.3 g/dL   Albumin 4.5 3.5 - 5.2 g/dL   GFR 60.96 >60.00 mL/min   Calcium 10.0 8.4 - 10.5 mg/dL  Lipid panel  Result Value Ref Range   Cholesterol 195 0 - 200 mg/dL   Triglycerides 163.0 (H) 0.0 - 149.0 mg/dL   HDL 66.40 >39.00 mg/dL   VLDL 32.6 0.0 - 40.0 mg/dL   LDL Cholesterol 96 0 - 99 mg/dL   Total CHOL/HDL Ratio 3    NonHDL 128.14   Hemoglobin A1c  Result Value Ref Range   Hgb A1c MFr Bld 5.9 4.6 - 6.5 %     COVID 19 screen:  No recent travel or known exposure to COVID19 The patient denies respiratory symptoms of COVID 19 at this time. The importance of social distancing was discussed  today.   Assessment and Plan   The patient's preventative maintenance and recommended screening tests for an annual wellness exam were reviewed in full today. Brought up to date unless services declined.  Counselled on the importance of diet, exercise, and its role in overall health and mortality. The patient's FH and SH was reviewed, including their home life, tobacco status, and drug and alcohol status.   Vaccines: uptodate flu, COVID x 3, Tdap, consider shingles vaccine ( given prescription), Given prevnar 71 today Mammo: 02/2021 nml  Mother with early breast cancer. Colon:   high risk polyps  03/21/21, 1 year recall PAP/DVE: Every 5 years pap, DVE yearly.  Last pap 01/2020 neg HPV, no lower abdominal symptoms..  DXA: 11/2016  Stable osteopenia in hip, normal density in spine, repeat in 5 years   DUE! Former smoker, 30 pack year history.  Quit 2016 We will set you up in the lung cancer screening program.  Hep C: neg.  STD screen/HIV: refused  Problem List Items Addressed This Visit     Pure hypercholesterolemia (Chronic)    Chronic, LDL not at goal less than 70 on simvastatin 40 mg daily.  We discussed low-cholesterol diet and increasing exercise.  She will work on this and we will reevaluate cholesterol panel in 3 to 6 months.  History of smoking 30 or more pack years    Referral to lung cancer screening program.      Relevant Orders   Ambulatory Referral Lung Cancer Screening [facility]   Osteopenia   Relevant Orders   DG Bone Density   Prediabetes    Chronic, slight worsening.  She will work on low carbohydrate diet.  She will return to regular exercise.      Vitamin D deficiency    Chronic, resolved with supplementation.      Other Visit Diagnoses     Medicare annual wellness visit, initial    -  Primary   Other fatigue       Relevant Orders   CBC with Differential/Platelet   TSH   Vitamin B12      Orders Placed This Encounter  Procedures   DG  Bone Density    Standing Status:   Future    Standing Expiration Date:   02/02/2023    Order Specific Question:   Reason for Exam (SYMPTOM  OR DIAGNOSIS REQUIRED)    Answer:   osteopenia    Order Specific Question:   Preferred imaging location?    Answer:   [facility]   CBC with Differential/Platelet   TSH   Vitamin B12   Ambulatory Referral Lung Cancer Screening [facility]    Referral Priority:   Routine    Referral Type:   Consultation    Referral Reason:   Specialty Services Required    Number of Visits Requested:   1    Eliezer Lofts, MD

## 2022-02-01 NOTE — Assessment & Plan Note (Signed)
Referral to lung cancer screening program.

## 2022-02-01 NOTE — Assessment & Plan Note (Signed)
Chronic, LDL not at goal less than 70 on simvastatin 40 mg daily.  We discussed low-cholesterol diet and increasing exercise.  She will work on this and we will reevaluate cholesterol panel in 3 to 6 months.

## 2022-02-01 NOTE — Assessment & Plan Note (Signed)
Chronic, resolved with supplementation.

## 2022-02-02 LAB — VITAMIN B12: Vitamin B-12: 311 pg/mL (ref 211–911)

## 2022-02-07 ENCOUNTER — Other Ambulatory Visit: Payer: Self-pay | Admitting: Family Medicine

## 2022-02-07 DIAGNOSIS — Z1231 Encounter for screening mammogram for malignant neoplasm of breast: Secondary | ICD-10-CM

## 2022-02-20 DIAGNOSIS — S52532D Colles' fracture of left radius, subsequent encounter for closed fracture with routine healing: Secondary | ICD-10-CM | POA: Diagnosis not present

## 2022-03-07 DIAGNOSIS — S52532D Colles' fracture of left radius, subsequent encounter for closed fracture with routine healing: Secondary | ICD-10-CM | POA: Diagnosis not present

## 2022-03-08 ENCOUNTER — Ambulatory Visit
Admission: RE | Admit: 2022-03-08 | Discharge: 2022-03-08 | Disposition: A | Discharge status: Home / Self Care | Payer: Medicare Other | Source: Ambulatory Visit | Attending: Family Medicine | Admitting: Family Medicine

## 2022-03-08 DIAGNOSIS — Z1231 Encounter for screening mammogram for malignant neoplasm of breast: Secondary | ICD-10-CM | POA: Insufficient documentation

## 2022-03-08 DIAGNOSIS — M8580 Other specified disorders of bone density and structure, unspecified site: Secondary | ICD-10-CM

## 2022-03-08 DIAGNOSIS — Z780 Asymptomatic menopausal state: Secondary | ICD-10-CM | POA: Insufficient documentation

## 2022-03-08 DIAGNOSIS — M85852 Other specified disorders of bone density and structure, left thigh: Secondary | ICD-10-CM | POA: Diagnosis not present

## 2022-03-21 DIAGNOSIS — S52532D Colles' fracture of left radius, subsequent encounter for closed fracture with routine healing: Secondary | ICD-10-CM | POA: Diagnosis not present

## 2022-04-18 ENCOUNTER — Other Ambulatory Visit: Payer: Self-pay | Admitting: Family Medicine

## 2022-04-25 ENCOUNTER — Other Ambulatory Visit: Payer: Self-pay | Admitting: *Deleted

## 2022-04-25 ENCOUNTER — Encounter: Payer: Self-pay | Admitting: *Deleted

## 2022-04-25 DIAGNOSIS — Z87891 Personal history of nicotine dependence: Secondary | ICD-10-CM

## 2022-04-25 DIAGNOSIS — Z122 Encounter for screening for malignant neoplasm of respiratory organs: Secondary | ICD-10-CM

## 2022-05-02 ENCOUNTER — Encounter: Payer: Self-pay | Admitting: Dermatology

## 2022-05-02 ENCOUNTER — Ambulatory Visit: Payer: Medicare Other | Admitting: Dermatology

## 2022-05-02 VITALS — BP 154/85 | HR 83

## 2022-05-02 DIAGNOSIS — L219 Seborrheic dermatitis, unspecified: Secondary | ICD-10-CM | POA: Diagnosis not present

## 2022-05-02 MED ORDER — KETOCONAZOLE 2 % EX SHAM: MEDICATED_SHAMPOO | CUTANEOUS | 5 refills | Status: AC

## 2022-05-02 MED ORDER — FLUOCINOLONE ACETONIDE 0.01 % OT OIL: TOPICAL_OIL | OTIC | 2 refills | Status: AC

## 2022-05-02 NOTE — Patient Instructions (Addendum)
Start Fluocinolone oil twice daily to ears as needed for rash/itching.  Start ketoconazole 2% shampoo apply three times per week, massage into scalp and leave in for 8-10 minutes before rinsing out. This can be followed by conditioner or shampoo and conditioner of your choice.   Topical steroids (such as triamcinolone, fluocinolone, fluocinonide, mometasone, clobetasol, halobetasol, betamethasone, hydrocortisone) can cause thinning and lightening of the skin if they are used for too long in the same area. Your physician has selected the right strength medicine for your problem and area affected on the body. Please use your medication only as directed by your physician to prevent side effects.    Due to recent changes in healthcare laws, you may see results of your pathology and/or laboratory studies on MyChart before the doctors have had a chance to review them. We understand that in some cases there may be results that are confusing or concerning to you. Please understand that not all results are received at the same time and often the doctors may need to interpret multiple results in order to provide you with the best plan of care or course of treatment. Therefore, we ask that you please give [place] 2 business days to thoroughly review all your results before contacting the office for clarification. Should we see a critical lab result, you will be contacted sooner.   If You Need Anything After Your Visit  If you have any questions or concerns for your doctor, please call our main line at [phone] and press option 4 to reach your doctor's medical assistant. If no one answers, please leave a voicemail as directed and we will return your call as soon as possible. Messages left after 4 pm will be answered the following business day.   You may also send [place] a message via [place]. We typically respond to MyChart messages within 1-2 business days.  For prescription refills, please ask your pharmacy to  contact our office. Our fax number is [phone].  If you have an urgent issue when the clinic is closed that cannot wait until the next business day, you can page your doctor at the number below.    Please note that while we do our best to be available for urgent issues outside of office hours, we are not available 24/7.   If you have an urgent issue and are unable to reach [place], you may choose to seek medical care at your doctor's office, retail clinic, urgent care center, or emergency room.  If you have a medical emergency, please immediately call 911 or go to the emergency department.  Pager Numbers  - Dr. Nehemiah Massed: [phone]  - Dr. Laurence Ferrari: [phone]  - Dr. Nicole Kindred: [phone]  In the event of inclement weather, please call our main line at [phone] for an update on the status of any delays or closures.  Dermatology Medication Tips: Please keep the boxes that topical medications come in in order to help keep track of the instructions about where and how to use these. Pharmacies typically print the medication instructions only on the boxes and not directly on the medication tubes.   If your medication is too expensive, please contact our office at [phone] option 4 or send [place] a message through [place].   We are unable to tell what your co-pay for medications will be in advance as this is different depending on your insurance coverage. However, we may be able to find a substitute medication at lower cost or fill out paperwork to get  insurance to cover a needed medication.   If a prior authorization is required to get your medication covered by your insurance company, please allow [place] 1-2 business days to complete this process.  Drug prices often vary depending on where the prescription is filled and some pharmacies may offer cheaper prices.  The website www.goodrx.com contains coupons for medications through different pharmacies. The prices here do not account for what  the cost may be with help from insurance (it may be cheaper with your insurance), but the website can give you the price if you did not use any insurance.  - You can print the associated coupon and take it with your prescription to the pharmacy.  - You may also stop by our office during regular business hours and pick up a GoodRx coupon card.  - If you need your prescription sent electronically to a different pharmacy, notify our office through [facility] or by phone at [phone] option 4.     Si Usted Necesita Algo Despus de Su Visita  Tambin puede enviarnos un mensaje a travs de Pharmacist, community. Por lo general respondemos a los mensajes de MyChart en el transcurso de 1 a 2 das hbiles.  Para renovar recetas, por favor pida a su farmacia que se ponga en contacto con nuestra oficina. Harland Dingwall de fax es [place] [phone].  Si tiene un asunto urgente cuando la clnica est cerrada y que no puede esperar hasta el siguiente da hbil, puede llamar/localizar a su doctor(a) al nmero que aparece a continuacin.   Por favor, tenga en cuenta que aunque hacemos todo lo posible para estar disponibles para asuntos urgentes fuera del horario de [place], no estamos disponibles las 24 horas del da, los 7 das de la [place].   Si tiene un problema urgente y no puede comunicarse con nosotros, puede optar por buscar atencin mdica  en el consultorio de su doctor(a), en una clnica privada, en un centro de atencin urgente o en una sala de emergencias.  Si tiene Engineering geologist, por favor llame inmediatamente al 911 o vaya a la sala de emergencias.  Nmeros de bper  - Dr. Nehemiah Massed: [phone]  - Dra. Moye: [phone]  - Dra. Nicole Kindred: [phone]  En caso de inclemencias del [place], por favor llame a Johnsie Kindred principal al [phone] para una actualizacin sobre el [place] de cualquier retraso o cierre.  Consejos para la medicacin en dermatologa: Por favor, guarde las  cajas en las que vienen los medicamentos de uso tpico para ayudarle a seguir las instrucciones sobre dnde y cmo usarlos. Las farmacias generalmente imprimen las instrucciones del medicamento slo en las cajas y no directamente en los tubos del [place].   Si su medicamento es muy caro, por favor, pngase en contacto con Zigmund Daniel llamando al [phone] y presione la opcin 4 o envenos un mensaje a travs de Pharmacist, community.   No podemos decirle cul ser su copago por los medicamentos por adelantado ya que esto es diferente dependiendo de la cobertura de su seguro. Sin embargo, es posible que podamos encontrar un medicamento sustituto a Electrical engineer un formulario para que el seguro cubra el medicamento que se considera necesario.   Si se requiere una autorizacin previa para que su compaa de seguros [place] su medicamento, por favor permtanos de 1 a 2 das hbiles para completar este proceso.  Los precios de los medicamentos varan con frecuencia dependiendo del Environmental consultant de dnde se surte la receta y Environmental health practitioner pueden ofrecer  precios ms baratos.  El sitio web www.goodrx.com tiene cupones para medicamentos de Airline pilot. Los precios aqu no tienen en cuenta lo que podra costar con la ayuda del seguro (puede ser ms barato con su seguro), pero el sitio web puede darle el precio si no utiliz Research scientist (physical sciences).  - Puede imprimir el cupn correspondiente y llevarlo con su receta a la farmacia.  - Tambin puede pasar por nuestra oficina durante el horario de atencin regular y Charity fundraiser una tarjeta de cupones de GoodRx.  - Si necesita que su receta se enve electrnicamente a una farmacia diferente, informe a nuestra oficina a travs de MyChart de [place] o por telfono llamando al [phone] y presione la opcin 4.

## 2022-05-02 NOTE — Progress Notes (Signed)
   New Patient Visit  Subjective  IREE CUMBO is a 67 y.o. female who presents for the following: Rash (Ears. Has used OTC treatments (HC cream, Neosporin), no help. Dur: several months).  Itchy.  She also gets scaling in scalp.  Has tried head and shoulders without improvement.    Review of Systems: No other skin or systemic complaints except as noted in HPI or Assessment and Plan.   Objective  Well appearing patient in no apparent distress; mood and affect are within normal limits.  A focused examination was performed including head, including the scalp, face, neck, nose, ears, eyelids, and lips. Relevant physical exam findings are noted in the Assessment and Plan.  Ears, scalp Pink scaliness in ear canals, scaling in scalp   Assessment & Plan  Seborrheic dermatitis Ears, scalp  Chronic and persistent condition with duration or expected duration over one year. Condition is bothersome/symptomatic for patient. Currently flared.   Start Fluocinolone oil twice daily to ears as needed for rash/itching.  Start ketoconazole 2% shampoo apply three times per week, massage into scalp and leave in for 8-10 minutes before rinsing out. This can be followed by conditioner or shampoo and conditioner of your choice.   Topical steroids (such as triamcinolone, fluocinolone, fluocinonide, mometasone, clobetasol, halobetasol, betamethasone, hydrocortisone) can cause thinning and lightening of the skin if they are used for too long in the same area. Your physician has selected the right strength medicine for your problem and area affected on the body. Please use your medication only as directed by your physician to prevent side effects.   Seborrheic Dermatitis  -  is a chronic persistent rash characterized by pinkness and scaling most commonly of the mid face but also can occur on the scalp (dandruff), ears; mid chest, mid back and groin.  It tends to be exacerbated by stress and cooler weather.   People who have neurologic disease may experience new onset or exacerbation of existing seborrheic dermatitis.  The condition is not curable but treatable and can be controlled.    ketoconazole (NIZORAL) 2 % shampoo - Ears, scalp 2-3 times per week lather on scalp and ears, leave on 8-10 minutes, rinse well  Fluocinolone Acetonide 0.01 % OIL - Ears, scalp Apply twice daily to ears as needed for rash/itching   Return if symptoms worsen or fail to improve.  I, Emelia Salisbury, CMA, am acting as scribe for Brendolyn [name], MD.  Documentation: I have reviewed the above documentation for accuracy and completeness, and I agree with the above.  Brendolyn [name] MD

## 2022-05-02 NOTE — Progress Notes (Deleted)
   Follow-Up Visit   Subjective  Connie West is a 67 y.o. female who presents for the following: Rash  ***  The following portions of the chart were reviewed this encounter and updated as appropriate: medications, allergies, medical history  Review of Systems:  No other skin or systemic complaints except as noted in HPI or Assessment and Plan.  Objective  Well appearing patient in no apparent distress; mood and affect are within normal limits.  A focused examination was performed of the following areas: ***    Assessment & Plan     Rash - Exam notable for: ***  ***wellcontrolled vs flared vs notatgoal  - Differential diagnosis:  ***  - Treatment Plan: ***  No follow-ups on file.  ***  Documentation: I have reviewed the above documentation for accuracy and completeness, and I agree with the above.  Brendolyn [name], MD

## 2022-05-15 ENCOUNTER — Ambulatory Visit (INDEPENDENT_AMBULATORY_CARE_PROVIDER_SITE_OTHER): Payer: Medicare Other | Admitting: Acute Care

## 2022-05-15 ENCOUNTER — Encounter: Payer: Self-pay | Admitting: Acute Care

## 2022-05-15 ENCOUNTER — Ambulatory Visit
Admission: RE | Admit: 2022-05-15 | Discharge: 2022-05-15 | Disposition: A | Discharge status: Home / Self Care | Payer: Medicare Other | Source: Ambulatory Visit | Attending: Acute Care | Admitting: Acute Care

## 2022-05-15 DIAGNOSIS — Z87891 Personal history of nicotine dependence: Secondary | ICD-10-CM | POA: Diagnosis not present

## 2022-05-15 DIAGNOSIS — Z122 Encounter for screening for malignant neoplasm of respiratory organs: Secondary | ICD-10-CM | POA: Diagnosis not present

## 2022-05-15 NOTE — Patient Instructions (Signed)
Thank you for participating in the [facility]. It was our pleasure to meet you today. We will call you with the results of your scan within the next few days. Your scan will be assigned a Lung RADS category score by the physicians reading the scans.  This Lung RADS score determines follow up scanning.  See below for description of categories, and follow up screening recommendations. We will be in touch to schedule your follow up screening annually or based on recommendations of our providers. We will fax a copy of your scan results to your Primary Care Physician, or the physician who referred you to the program, to ensure they have the results. Please call the office if you have any questions or concerns regarding your scanning experience or results.  Our office number is [phone]. Please speak with Denise Phelps, RN. , or  Denise Buckner RN, They are  our Lung Cancer Screening RN.'s If They are unavailable when you call, Please leave a message on the voice mail. We will return your call at our earliest convenience.This voice mail is monitored several times a day.  Remember, if your scan is normal, we will scan you annually as long as you continue to meet the criteria for the program. (Age 50-80, Current smoker or smoker who has quit within the last 15 years). If you are a smoker, remember, quitting is the single most powerful action that you can take to decrease your risk of lung cancer and other pulmonary, breathing related problems. We know quitting is hard, and we are here to help.  Please let us know if there is anything we can do to help you meet your goal of quitting. If you are a former smoker, congratulations. We are proud of you! Remain smoke free! Remember you can refer friends or family members through the number above.  We will screen them to make sure they meet criteria for the program. Thank you for helping us take better care of you by  participating in Lung Screening.  You can receive free nicotine replacement therapy ( patches, gum or mints) by calling 1-800-QUIT NOW. Please call so we can get you on the path to becoming  a non-smoker. I know it is hard, but you can do this!  Lung RADS Categories:  Lung RADS 1: no nodules or definitely non-concerning nodules.  Recommendation is for a repeat annual scan in 12 months.  Lung RADS 2:  nodules that are non-concerning in appearance and behavior with a very low likelihood of becoming an active cancer. Recommendation is for a repeat annual scan in 12 months.  Lung RADS 3: nodules that are probably non-concerning , includes nodules with a low likelihood of becoming an active cancer.  Recommendation is for a 6-month repeat screening scan. Often noted after an upper respiratory illness. We will be in touch to make sure you have no questions, and to schedule your 6-month scan.  Lung RADS 4 A: nodules with concerning findings, recommendation is most often for a follow up scan in 3 months or additional testing based on our provider's assessment of the scan. We will be in touch to make sure you have no questions and to schedule the recommended 3 month follow up scan.  Lung RADS 4 B:  indicates findings that are concerning. We will be in touch with you to schedule additional diagnostic testing based on our provider's  assessment of the scan.  Other options for assistance in smoking cessation (   As covered by your insurance benefits)  Hypnosis for smoking cessation  [employer] [phone]  Acupuncture for smoking cessation  [facility] [phone]   

## 2022-05-15 NOTE — Progress Notes (Signed)
Virtual Visit via Telephone Note  I connected with Connie West on 05/15/22 at 10:30 AM EDT by telephone and verified that I am speaking with the correct person using two identifiers.  Location: Patient:  At home Provider: [address], [place], [place], [address]    I discussed the limitations, risks, security and privacy concerns of performing an evaluation and management service by telephone and the availability of in person appointments. I also discussed with the patient that there may be a patient responsible charge related to this service. The patient expressed understanding and agreed to proceed.   Shared Decision Making Visit Lung Cancer Screening Program [phone])   Eligibility: Age 67 y.o. Pack Years Smoking History Calculation 27 pack year  pack year smoking history (# packs/per year x # years smoked) Recent History of coughing up blood  no Unexplained weight loss? no ( >Than 15 pounds within the last 6 months ) Prior History Lung / other cancer no (Diagnosis within the last 5 years already requiring surveillance chest CT Scans). Smoking Status Former Smoker Former Smokers: Years since quit: 8 years  Quit Date: 2016  Visit Components: Discussion included one or more decision making aids. yes Discussion included risk/benefits of screening. yes Discussion included potential follow up diagnostic testing for abnormal scans. yes Discussion included meaning and risk of over diagnosis. yes Discussion included meaning and risk of False Positives. yes Discussion included meaning of total radiation exposure. yes  Counseling Included: Importance of adherence to annual lung cancer LDCT screening. yes Impact of comorbidities on ability to participate in the program. yes Ability and willingness to under diagnostic treatment. yes  Smoking Cessation Counseling: Current Smokers:  Discussed importance of smoking cessation. yes Information about tobacco cessation classes and  interventions provided to patient. yes Patient provided with "ticket" for LDCT Scan. yes Symptomatic Patient. no  Counseling NA Diagnosis Code: Tobacco Use Z72.0 Asymptomatic Patient yes  Counseling (Intermediate counseling: > three minutes counseling) [id number] Former Smokers:  Discussed the importance of maintaining cigarette abstinence. yes Diagnosis Code: Personal History of Nicotine Dependence. [id number] Information about tobacco cessation classes and interventions provided to patient. Yes Patient provided with "ticket" for LDCT Scan. yes Written Order for Lung Cancer Screening with LDCT placed in Epic. Yes (CT Chest Lung Cancer Screening Low Dose W/O CM) [id number] Z12.2-Screening of respiratory organs Z87.891-Personal history of nicotine dependence  I spent 25 minutes of face to face time/virtual visit time  with  Connie West discussing the risks and benefits of lung cancer screening. We took the time to pause the power point at intervals to allow for questions to be asked and answered to ensure understanding. We discussed that she had taken the single most powerful action possible to decrease her risk of developing lung cancer when she quit smoking. I counseled her to remain smoke free, and to contact me if she ever had the desire to smoke again so that I can provide resources and tools to help support the effort to remain smoke free. We discussed the time and location of the scan, and that either  Doroteo Glassman RN, Joella Prince, RN or I  or I will call / send a letter with the results within  24-72 hours of receiving them. She has the office contact information in the event she needs to speak with me,  she verbalized understanding of all of the above and had no further questions upon leaving the office.     I explained to the patient that  there has been a high incidence of coronary artery disease noted on these exams. I explained that this is a non-gated exam therefore degree or severity cannot  be determined. This patient is on statin therapy. I have asked the patient to follow-up with their PCP regarding any incidental finding of coronary artery disease and management with diet or medication as they feel is clinically indicated. The patient verbalized understanding of the above and had no further questions.     Magdalen Spatz, NP 05/15/2022

## 2022-05-16 ENCOUNTER — Encounter: Payer: Self-pay | Admitting: Acute Care

## 2022-05-16 ENCOUNTER — Telehealth: Payer: Self-pay | Admitting: Acute Care

## 2022-05-16 ENCOUNTER — Other Ambulatory Visit: Payer: Self-pay | Admitting: Acute Care

## 2022-05-16 DIAGNOSIS — R918 Other nonspecific abnormal finding of lung field: Secondary | ICD-10-CM

## 2022-05-16 NOTE — Telephone Encounter (Signed)
Results/plan faxed to pcp

## 2022-05-16 NOTE — Telephone Encounter (Signed)
Noted, will follow.

## 2022-05-16 NOTE — Telephone Encounter (Signed)
Received call report from [place] with [facility] on patient's LCS CT done on 05/15/22. Sarah, please review the result/impression copied below:  IMPRESSION: 1. Lung-RADS 4X, highly suspicious. Additional imaging evaluation or consultation with Pulmonology or Thoracic Surgery recommended. Right upper lobe lung masses, most consistent with synchronous primary bronchogenic carcinomas. Adenopathy within the chest and lower neck, most consistent with nodal metastasis. 2. Right sided interlobular septal thickening, suspicious for lymphangitic tumor spread. 3. Aortic atherosclerosis (ICD10-I70.0) and emphysema (ICD10-J43.9).  Please advise, thank you.   **Also routing to lung nodule pool**

## 2022-05-16 NOTE — Telephone Encounter (Signed)
I have called the patient with the results of her low dose CT Chest. The scan has been read as a 4X. She has Right upper lobe lung masses, most consistent with synchronous primary bronchogenic carcinomas. Adenopathy within the chest and lower neck, most consistent with nodal metastasis.Also Right sided interlobular septal thickening, suspicious for lymphangitic tumor spread. I explained that we will order a PET scan to better evaluate these areas of concern. I told her she will get a call to get this scheduled.  Additionally she is being scheduled to see Dr. Genia Harold 05/23/2022 at 11:30 am in the [facility] office. She will also get a call to confirm this appointment. Pt quit smoking 8 years ago. She has a 27 pack year smoking history.   Langley Gauss, please fax results to PCP and let them know plan is for PET and follow up with Pulmonary for evaluation for bronchoscopy with Biopsies on 05/23/22 at 11:30 am   Keep on tickle list for follow up in the event this is not a cancer and she will need to return to the screening program.   Thanks so much

## 2022-05-22 ENCOUNTER — Ambulatory Visit
Admission: RE | Admit: 2022-05-22 | Discharge: 2022-05-22 | Disposition: A | Discharge status: Home / Self Care | Payer: Medicare Other | Source: Ambulatory Visit | Attending: Acute Care | Admitting: Acute Care

## 2022-05-22 DIAGNOSIS — R918 Other nonspecific abnormal finding of lung field: Secondary | ICD-10-CM | POA: Insufficient documentation

## 2022-05-22 DIAGNOSIS — C3490 Malignant neoplasm of unspecified part of unspecified bronchus or lung: Secondary | ICD-10-CM | POA: Diagnosis not present

## 2022-05-22 DIAGNOSIS — I700 Atherosclerosis of aorta: Secondary | ICD-10-CM | POA: Insufficient documentation

## 2022-05-22 LAB — GLUCOSE, CAPILLARY: Glucose-Capillary: 98 mg/dL (ref 70–99)

## 2022-05-22 MED ORDER — FLUDEOXYGLUCOSE F - 18 (FDG) INJECTION
9.0700 | Freq: Once | INTRAVENOUS | Status: AC | PRN
  Administered 2022-05-22: 9.07 via INTRAVENOUS

## 2022-05-23 ENCOUNTER — Encounter: Payer: Self-pay | Admitting: Student in an Organized Health Care Education/Training Program

## 2022-05-23 ENCOUNTER — Telehealth: Payer: Self-pay

## 2022-05-23 ENCOUNTER — Other Ambulatory Visit: Payer: Self-pay | Admitting: Student in an Organized Health Care Education/Training Program

## 2022-05-23 ENCOUNTER — Ambulatory Visit: Payer: Medicare Other | Admitting: Student in an Organized Health Care Education/Training Program

## 2022-05-23 VITALS — BP 134/86 | HR 98 | Temp 97.9°F | Ht 65.0 in | Wt 167.4 lb

## 2022-05-23 DIAGNOSIS — R918 Other nonspecific abnormal finding of lung field: Secondary | ICD-10-CM | POA: Diagnosis not present

## 2022-05-23 NOTE — H&P (View-Only) (Signed)
 Synopsis: Referred in for pulmonary nodule by Groce, Sarah F, NP  Assessment & Plan:   #Lung Nodule  Nodule Location: RUL, two nodules Nodule Size: 5 cm and 3 cm Nodule Spiculation: Yes Associated Lymphadenopathy: Yes, PET avid Smoking Status (former) Extrathoracic cancer > 5 years prior (/no) ECOG: 0  Chest Imaging: I personally reviewed the chest radiographic images listed below  CT Chest 05/15/2022: 2 right upper lobe lung masses. The more superior and medial measures volume derived equivalent diameter 42.5 mm on 86/3. The more inferior and lateral measures volume derived equivalent diameter 53.3 mm on 108/3.  The patient is here to discuss their imaging abnormalities which include a RUL mass with mediastinal lymphadenopathy. The findings are highly concerning for malignancy.  We discussed the importance of diagnosis and staging in lung malignancies, and the approach to obtaining a tissue diagnosis which would include robotic assisted navigational bronchoscopy with endobronchial ultrasound guided sampling.  We also discussed the risks associated with the procedure which include a 2% risk of pneumothorax, infection, bleeding, and nondiagnostic procedure in detail.  I explained that patients typically are able to return home the same day of the procedure, but in rare cases admission to the hospital for observation and treatment is required.  After our discussion, the patient elected to proceed with the procedure  Recommendations: Robotic assisted navigational bronchoscopy, EBUS I will discuss with colleagues from IR and Oncology if core biopsy of the cervical node is appropriate and feasible.  No follow-ups on file.  I spent 45 minutes caring for this patient today, including preparing to see the patient, obtaining a medical history , reviewing a separately obtained history, performing a medically appropriate examination and/or evaluation, counseling and educating the  patient/family/caregiver, ordering medications, tests, or procedures, documenting clinical information in the electronic health record, and independently interpreting results (not separately reported/billed) and communicating results to the patient/family/caregiver  Christian Borgerding, MD [facility] 05/23/2022 11:33 AM    End of visit medications:  No orders of the defined types were placed in this encounter.    Current Outpatient Medications:    aspirin EC 81 MG tablet, Take 81 mg by mouth daily., Disp: , Rfl:    calcium carbonate (OS-CAL) 600 MG TABS, Take 600 mg by mouth daily., Disp: , Rfl:    cholecalciferol (VITAMIN D) 1000 UNITS tablet, Take 1,000 Units by mouth daily., Disp: , Rfl:    Coenzyme Q10 (CO Q 10) 100 MG CAPS, Take 1 capsule by mouth daily., Disp: , Rfl:    fish oil-omega-3 fatty acids 1000 MG capsule, Take 1 g by mouth daily., Disp: , Rfl:    Fluocinolone Acetonide 0.01 % OIL, Apply twice daily to ears as needed for rash/itching, Disp: 20 mL, Rfl: 2   ibuprofen (ADVIL,MOTRIN) 200 MG tablet, Take 200 mg by mouth every 6 (six) hours as needed., Disp: , Rfl:    ketoconazole (NIZORAL) 2 % shampoo, 2-3 times per week lather on scalp and ears, leave on 8-10 minutes, rinse well, Disp: 120 mL, Rfl: 5   simvastatin (ZOCOR) 40 MG tablet, TAKE 1 TABLET BY MOUTH EVERY DAY, Disp: 90 tablet, Rfl: 3   Subjective:   PATIENT ID: Connie West GENDER: female DOB: 07/28/1955, MRN: [id number]  Chief Complaint  Patient presents with   pulmonary consult    PET 4/11--SOB with incline and occ dry cough.     HPI  Patient is a pleasant 66 year old female presenting to clinic for the evaluation   of a pulmonary nodule.  She was enrolled in our lung cancer screening program and her CT showed two RUL masses for which she is referred to us today. Symptoms include a cough that is new over the past few months, as well as exertional dyspnea. The cough is dry and non-productive, no  hemoptysis reported. Weight has been stable, with no night sweats or chills reported.  Patient is originally from [place]. She has a history of smoking, quit 10 years ago (started age 14, 1/2 pack daily). 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 Patient's x-husband recently passed away of lung cancer.  Ancillary information including prior medications, full medical/surgical/family/social histories, and PFTs (when available) are listed below and have been reviewed.   Review of Systems  Constitutional:  Negative for chills, fever, malaise/fatigue and weight loss.  Respiratory:  Positive for cough and shortness of breath. Negative for hemoptysis, sputum production and wheezing.   Cardiovascular:  Negative for chest pain.  Skin:  Negative for rash.     Objective:   Vitals:   05/23/22 1059  BP: 134/86  Pulse: 98  Temp: 97.9 F (36.6 C)  TempSrc: Temporal  SpO2: 96%  Weight: 167 lb 6.4 oz (75.9 kg)  Height: 5' 5" (1.651 m)   96% on RA BMI Readings from Last 3 Encounters:  05/23/22 27.86 kg/m  02/01/22 27.96 kg/m  10/25/21 28.64 kg/m   Wt Readings from Last 3 Encounters:  05/23/22 167 lb 6.4 oz (75.9 kg)  02/01/22 168 lb (76.2 kg)  10/25/21 170 lb 4 oz (77.2 kg)    Physical Exam Constitutional:      General: She is not in acute distress.    Appearance: Normal appearance. She is not ill-appearing.  HENT:     Head: Normocephalic.     Nose: Nose normal.     Mouth/Throat:     Mouth: Mucous membranes are moist.  Cardiovascular:     Rate and Rhythm: Normal rate and regular rhythm.     Pulses: Normal pulses.     Heart sounds: Normal heart sounds.  Pulmonary:     Effort: Pulmonary effort is normal.     Breath sounds: Normal breath sounds. No wheezing, rhonchi or rales.  Musculoskeletal:     Right lower leg: No edema.     Left lower leg: No edema.  Neurological:     General: No focal deficit present.     Mental Status: She  is alert and oriented to person, place, and time. Mental status is at baseline.     Ancillary Information    Past Medical History:  Diagnosis Date   Hyperlipidemia      Family History  Problem Relation Age of Onset   Breast cancer Mother 68   Cancer Mother        breast     Past Surgical History:  Procedure Laterality Date   BREAST CYST ASPIRATION Right 07/20/2012   FNA benign   BREAST CYST ASPIRATION Right    BREAST SURGERY Right 07-20-12   FNA benign   CESAREAN SECTION     CHOLECYSTECTOMY     COLONOSCOPY WITH PROPOFOL N/A 03/21/2021   Procedure: COLONOSCOPY WITH PROPOFOL;  Surgeon: Vanga, Rohini Reddy, MD;  Location: [facility] ENDOSCOPY;  Service: Gastroenterology;  Laterality: N/A;   OVARY SURGERY      Social History   Socioeconomic History   Marital status: Divorced    Spouse name: Not on file   Number of children:   2   Years of education: Not on file   Highest education level: Not on file  Occupational History   Occupation: medical-ad assistant    Employer: [employer]  Tobacco Use   Smoking status: Former    Packs/day: 0.62    Years: 43.00    Additional pack years: 0.00    Total pack years: 26.66    Types: Cigarettes    Quit date: 2016    Years since quitting: 8.2   Smokeless tobacco: Never   Tobacco comments:    quit x 1 month 11/16  Vaping Use   Vaping Use: Never used  Substance and Sexual Activity   Alcohol use: Yes    Alcohol/week: 0.0 standard drinks of alcohol    Comment: rarely   Drug use: No   Sexual activity: Not on file  Other Topics Concern   Not on file  Social History Narrative   From [place]         Social Determinants of Health   Financial Resource Strain: Not on file  Food Insecurity: Not on file  Transportation Needs: Not on file  Physical Activity: Not on file  Stress: Not on file  Social Connections: Not on file  Intimate Partner Violence: Not on file     Allergies  Allergen Reactions   Penicillins     REACTION: hives      CBC    Component Value Date/Time   WBC 7.2 02/01/2022 1023   RBC 4.74 02/01/2022 1023   HGB 13.8 02/01/2022 1023   HCT 40.9 02/01/2022 1023   PLT 330.0 02/01/2022 1023   MCV 86.4 02/01/2022 1023   MCH 30.0 01/04/2018 1051   MCHC 33.8 02/01/2022 1023   RDW 13.0 02/01/2022 1023   LYMPHSABS 1.9 02/01/2022 1023   MONOABS 0.6 02/01/2022 1023   EOSABS 0.3 02/01/2022 1023   BASOSABS 0.0 02/01/2022 1023    Pulmonary Functions Testing Results:     No data to display          Outpatient Medications Prior to Visit  Medication Sig Dispense Refill   aspirin EC 81 MG tablet Take 81 mg by mouth daily.     calcium carbonate (OS-CAL) 600 MG TABS Take 600 mg by mouth daily.     cholecalciferol (VITAMIN D) 1000 UNITS tablet Take 1,000 Units by mouth daily.     Coenzyme Q10 (CO Q 10) 100 MG CAPS Take 1 capsule by mouth daily.     fish oil-omega-3 fatty acids 1000 MG capsule Take 1 g by mouth daily.     Fluocinolone Acetonide 0.01 % OIL Apply twice daily to ears as needed for rash/itching 20 mL 2   ibuprofen (ADVIL,MOTRIN) 200 MG tablet Take 200 mg by mouth every 6 (six) hours as needed.     ketoconazole (NIZORAL) 2 % shampoo 2-3 times per week lather on scalp and ears, leave on 8-10 minutes, rinse well 120 mL 5   simvastatin (ZOCOR) 40 MG tablet TAKE 1 TABLET BY MOUTH EVERY DAY 90 tablet 3   No facility-administered medications prior to visit.   

## 2022-05-23 NOTE — Progress Notes (Signed)
Synopsis: Referred in for pulmonary nodule by Bevelyn Ngo, NP  Assessment & Plan:   #Lung Nodule  Nodule Location: RUL, two nodules Nodule Size: 5 cm and 3 cm Nodule Spiculation: Yes Associated Lymphadenopathy: Yes, PET avid Smoking Status (former) Extrathoracic cancer > 5 years prior (/no) ECOG: 0  Chest Imaging: I personally reviewed the chest radiographic images listed below  CT Chest 05/15/2022: 2 right upper lobe lung masses. The more superior and medial measures volume derived equivalent diameter 42.5 mm on 86/3. The more inferior and lateral measures volume derived equivalent diameter 53.3 mm on 108/3.  The patient is here to discuss their imaging abnormalities which include a RUL mass with mediastinal lymphadenopathy. The findings are highly concerning for malignancy.  We discussed the importance of diagnosis and staging in lung malignancies, and the approach to obtaining a tissue diagnosis which would include robotic assisted navigational bronchoscopy with endobronchial ultrasound guided sampling.  We also discussed the risks associated with the procedure which include a 2% risk of pneumothorax, infection, bleeding, and nondiagnostic procedure in detail.  I explained that patients typically are able to return home the same day of the procedure, but in rare cases admission to the hospital for observation and treatment is required.  After our discussion, the patient elected to proceed with the procedure  Recommendations: Robotic assisted navigational bronchoscopy, EBUS I will discuss with colleagues from IR and Oncology if core biopsy of the cervical node is appropriate and feasible.  No follow-ups on file.  I spent 45 minutes caring for this patient today, including preparing to see the patient, obtaining a medical history , reviewing a separately obtained history, performing a medically appropriate examination and/or evaluation, counseling and educating the  patient/family/caregiver, ordering medications, tests, or procedures, documenting clinical information in the electronic health record, and independently interpreting results (not separately reported/billed) and communicating results to the patient/family/caregiver  Raechel Chute, MD [facility] 05/23/2022 11:33 AM    End of visit medications:  No orders of the defined types were placed in this encounter.    Current Outpatient Medications:    aspirin EC 81 MG tablet, Take 81 mg by mouth daily., Disp: , Rfl:    calcium carbonate (OS-CAL) 600 MG TABS, Take 600 mg by mouth daily., Disp: , Rfl:    cholecalciferol (VITAMIN D) 1000 UNITS tablet, Take 1,000 Units by mouth daily., Disp: , Rfl:    Coenzyme Q10 (CO Q 10) 100 MG CAPS, Take 1 capsule by mouth daily., Disp: , Rfl:    fish oil-omega-3 fatty acids 1000 MG capsule, Take 1 g by mouth daily., Disp: , Rfl:    Fluocinolone Acetonide 0.01 % OIL, Apply twice daily to ears as needed for rash/itching, Disp: 20 mL, Rfl: 2   ibuprofen (ADVIL,MOTRIN) 200 MG tablet, Take 200 mg by mouth every 6 (six) hours as needed., Disp: , Rfl:    ketoconazole (NIZORAL) 2 % shampoo, 2-3 times per week lather on scalp and ears, leave on 8-10 minutes, rinse well, Disp: 120 mL, Rfl: 5   simvastatin (ZOCOR) 40 MG tablet, TAKE 1 TABLET BY MOUTH EVERY DAY, Disp: 90 tablet, Rfl: 3   Subjective:   PATIENT ID: Connie West GENDER: female DOB: 05/02/1955, MRN: [id number]  Chief Complaint  Patient presents with   pulmonary consult    PET 4/11--SOB with incline and occ dry cough.     HPI  Patient is a pleasant 67 year old female presenting to clinic for the evaluation  of a pulmonary nodule.  She was enrolled in our lung cancer screening program and her CT showed two RUL masses for which she is referred to us today. Symptoms include a cough that is new over the past few months, as well as exertional dyspnea. The cough is dry and non-productive, no  hemoptysis reported. Weight has been stable, with no night sweats or chills reported.  Patient is originally from [place]. She has a history of smoking, quit 10 years ago (started age 67, 1/2 pack daily). 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 Patient's x-husband recently passed away of lung cancer.  Ancillary information including prior medications, full medical/surgical/family/social histories, and PFTs (when available) are listed below and have been reviewed.   Review of Systems  Constitutional:  Negative for chills, fever, malaise/fatigue and weight loss.  Respiratory:  Positive for cough and shortness of breath. Negative for hemoptysis, sputum production and wheezing.   Cardiovascular:  Negative for chest pain.  Skin:  Negative for rash.     Objective:   Vitals:   05/23/22 1059  BP: 134/86  Pulse: 98  Temp: 97.9 F (36.6 C)  TempSrc: Temporal  SpO2: 96%  Weight: 167 lb 6.4 oz (75.9 kg)  Height: 5\' 5"  (1.651 m)   96% on RA BMI Readings from Last 3 Encounters:  05/23/22 27.86 kg/m  02/01/22 27.96 kg/m  10/25/21 28.64 kg/m   Wt Readings from Last 3 Encounters:  05/23/22 167 lb 6.4 oz (75.9 kg)  02/01/22 168 lb (76.2 kg)  10/25/21 170 lb 4 oz (77.2 kg)    Physical Exam Constitutional:      General: She is not in acute distress.    Appearance: Normal appearance. She is not ill-appearing.  HENT:     Head: Normocephalic.     Nose: Nose normal.     Mouth/Throat:     Mouth: Mucous membranes are moist.  Cardiovascular:     Rate and Rhythm: Normal rate and regular rhythm.     Pulses: Normal pulses.     Heart sounds: Normal heart sounds.  Pulmonary:     Effort: Pulmonary effort is normal.     Breath sounds: Normal breath sounds. No wheezing, rhonchi or rales.  Musculoskeletal:     Right lower leg: No edema.     Left lower leg: No edema.  Neurological:     General: No focal deficit present.     Mental Status: She  is alert and oriented to person, place, and time. Mental status is at baseline.     Ancillary Information    Past Medical History:  Diagnosis Date   Hyperlipidemia      Family History  Problem Relation Age of Onset   Breast cancer Mother [id number]   Cancer Mother        breast     Past Surgical History:  Procedure Laterality Date   BREAST CYST ASPIRATION Right 07/20/2012   FNA benign   BREAST CYST ASPIRATION Right    BREAST SURGERY Right 07-20-12   FNA benign   CESAREAN SECTION     CHOLECYSTECTOMY     COLONOSCOPY WITH PROPOFOL N/A 03/21/2021   Procedure: COLONOSCOPY WITH PROPOFOL;  Surgeon: Toney ReilVanga, Rohini Reddy, MD;  Location: [facility] ENDOSCOPY;  Service: Gastroenterology;  Laterality: N/A;   OVARY SURGERY      Social History   Socioeconomic History   Marital status: Divorced    Spouse name: Not on file   Number of children:  2   Years of education: Not on file   Highest education level: Not on file  Occupational History   Occupation: Futures trader    Employer: [employer]  Tobacco Use   Smoking status: Former    Packs/day: 0.62    Years: 43.00    Additional pack years: 0.00    Total pack years: 26.66    Types: Cigarettes    Quit date: 2016    Years since quitting: 8.2   Smokeless tobacco: Never   Tobacco comments:    quit x 1 month 11/16  Vaping Use   Vaping Use: Never used  Substance and Sexual Activity   Alcohol use: Yes    Alcohol/week: 0.0 standard drinks of alcohol    Comment: rarely   Drug use: No   Sexual activity: Not on file  Other Topics Concern   Not on file  Social History Narrative   From [place]         Social Determinants of Health   Financial Resource Strain: Not on file  Food Insecurity: Not on file  Transportation Needs: Not on file  Physical Activity: Not on file  Stress: Not on file  Social Connections: Not on file  Intimate Partner Violence: Not on file     Allergies  Allergen Reactions   Penicillins     REACTION: hives      CBC    Component Value Date/Time   WBC 7.2 02/01/2022 1023   RBC 4.74 02/01/2022 1023   HGB 13.8 02/01/2022 1023   HCT 40.9 02/01/2022 1023   PLT 330.0 02/01/2022 1023   MCV 86.4 02/01/2022 1023   MCH 30.0 01/04/2018 1051   MCHC 33.8 02/01/2022 1023   RDW 13.0 02/01/2022 1023   LYMPHSABS 1.9 02/01/2022 1023   MONOABS 0.6 02/01/2022 1023   EOSABS 0.3 02/01/2022 1023   BASOSABS 0.0 02/01/2022 1023    Pulmonary Functions Testing Results:     No data to display          Outpatient Medications Prior to Visit  Medication Sig Dispense Refill   aspirin EC 81 MG tablet Take 81 mg by mouth daily.     calcium carbonate (OS-CAL) 600 MG TABS Take 600 mg by mouth daily.     cholecalciferol (VITAMIN D) 1000 UNITS tablet Take 1,000 Units by mouth daily.     Coenzyme Q10 (CO Q 10) 100 MG CAPS Take 1 capsule by mouth daily.     fish oil-omega-3 fatty acids 1000 MG capsule Take 1 g by mouth daily.     Fluocinolone Acetonide 0.01 % OIL Apply twice daily to ears as needed for rash/itching 20 mL 2   ibuprofen (ADVIL,MOTRIN) 200 MG tablet Take 200 mg by mouth every 6 (six) hours as needed.     ketoconazole (NIZORAL) 2 % shampoo 2-3 times per week lather on scalp and ears, leave on 8-10 minutes, rinse well 120 mL 5   simvastatin (ZOCOR) 40 MG tablet TAKE 1 TABLET BY MOUTH EVERY DAY 90 tablet 3   No facility-administered medications prior to visit.

## 2022-05-23 NOTE — Telephone Encounter (Signed)
[facility]ch scheduled for 4/26 at [facility].  Will hold message to ensure f/u.

## 2022-05-24 ENCOUNTER — Encounter: Payer: Self-pay | Admitting: Student in an Organized Health Care Education/Training Program

## 2022-05-24 NOTE — Telephone Encounter (Signed)
Lm for patient.  

## 2022-05-24 NOTE — Telephone Encounter (Signed)
Pt called office back and I have scheduled her a f/u with Dr. Aundria West after bronch. Nothing further needed.

## 2022-05-27 ENCOUNTER — Other Ambulatory Visit: Payer: Medicare Other

## 2022-06-03 ENCOUNTER — Ambulatory Visit
Admission: RE | Admit: 2022-06-03 | Discharge: 2022-06-03 | Disposition: A | Discharge status: Home / Self Care | Payer: Medicare Other | Source: Ambulatory Visit | Attending: Student in an Organized Health Care Education/Training Program | Admitting: Student in an Organized Health Care Education/Training Program

## 2022-06-03 DIAGNOSIS — R918 Other nonspecific abnormal finding of lung field: Secondary | ICD-10-CM | POA: Insufficient documentation

## 2022-06-03 DIAGNOSIS — R590 Localized enlarged lymph nodes: Secondary | ICD-10-CM | POA: Diagnosis not present

## 2022-06-03 DIAGNOSIS — I700 Atherosclerosis of aorta: Secondary | ICD-10-CM | POA: Diagnosis not present

## 2022-06-06 ENCOUNTER — Other Ambulatory Visit: Payer: Self-pay

## 2022-06-06 ENCOUNTER — Encounter (HOSPITAL_COMMUNITY): Payer: Self-pay | Admitting: Student in an Organized Health Care Education/Training Program

## 2022-06-06 NOTE — Progress Notes (Signed)
SDW call  Patient was given pre-op instructions over the phone. Patient verbalized understanding of instructions provided.     PCP - Dr. Pattricia Boss  PPM/ICD - Denies  Sleep Study/sleep apnea/CPAP: Denies  Non-diabetic  Blood Thinner Instructions: Denies Aspirin Instructions: Last dose 06/06/2022   ERAS Protcol - No, NPO PRE-SURGERY Ensure or G2-    COVID TEST- DOS, 06/07/2022   Anesthesia review: No   Patient denies shortness of breath, fever, cough and chest pain over the phone call   Your procedure is scheduled on Friday, June 07, 2022  Report to [facility] Main Entrance "A" at  0930  A.M., then check in with the Admitting office.  Call this number if you have problems the morning of surgery:  [phone]   If you have any questions prior to your surgery date call [phone]: Open Monday-Friday 8am-4pm If you experience any cold or flu symptoms such as cough, fever, chills, shortness of breath, etc. between now and your scheduled surgery, please notify us at the above number     Remember:  Do not eat or drink after midnight the night before your surgery   Take these medicines the morning of surgery with A SIP OF WATER:  Simvastatin As of today, STOP taking any Aspirin (unless otherwise instructed by your surgeon) Aleve, Naproxen, Ibuprofen, Motrin, Advil, Goody's, BC's, all herbal medications, fish oil, and all vitamins.

## 2022-06-07 ENCOUNTER — Ambulatory Visit (HOSPITAL_COMMUNITY)
Admission: RE | Admit: 2022-06-07 | Discharge: 2022-06-07 | Disposition: A | Discharge status: Home / Self Care | Payer: Medicare Other | Attending: Student in an Organized Health Care Education/Training Program | Admitting: Student in an Organized Health Care Education/Training Program

## 2022-06-07 ENCOUNTER — Other Ambulatory Visit: Payer: Self-pay | Admitting: Student in an Organized Health Care Education/Training Program

## 2022-06-07 ENCOUNTER — Ambulatory Visit (HOSPITAL_COMMUNITY): Payer: Medicare Other | Admitting: Certified Registered"

## 2022-06-07 ENCOUNTER — Encounter (HOSPITAL_COMMUNITY): Payer: Self-pay | Admitting: Student in an Organized Health Care Education/Training Program

## 2022-06-07 ENCOUNTER — Ambulatory Visit (HOSPITAL_COMMUNITY): Payer: Medicare Other

## 2022-06-07 ENCOUNTER — Ambulatory Visit (HOSPITAL_BASED_OUTPATIENT_CLINIC_OR_DEPARTMENT_OTHER): Payer: Medicare Other | Admitting: Certified Registered"

## 2022-06-07 ENCOUNTER — Encounter (HOSPITAL_COMMUNITY)
Admission: RE | Disposition: A | Discharge status: Home / Self Care | Payer: Self-pay | Source: Home / Self Care | Attending: Student in an Organized Health Care Education/Training Program

## 2022-06-07 ENCOUNTER — Telehealth: Payer: Self-pay

## 2022-06-07 ENCOUNTER — Other Ambulatory Visit: Payer: Self-pay

## 2022-06-07 DIAGNOSIS — Z87891 Personal history of nicotine dependence: Secondary | ICD-10-CM | POA: Insufficient documentation

## 2022-06-07 DIAGNOSIS — C771 Secondary and unspecified malignant neoplasm of intrathoracic lymph nodes: Secondary | ICD-10-CM | POA: Diagnosis not present

## 2022-06-07 DIAGNOSIS — Z1152 Encounter for screening for COVID-19: Secondary | ICD-10-CM | POA: Diagnosis not present

## 2022-06-07 DIAGNOSIS — C3490 Malignant neoplasm of unspecified part of unspecified bronchus or lung: Secondary | ICD-10-CM | POA: Diagnosis not present

## 2022-06-07 DIAGNOSIS — Z8673 Personal history of transient ischemic attack (TIA), and cerebral infarction without residual deficits: Secondary | ICD-10-CM

## 2022-06-07 DIAGNOSIS — R918 Other nonspecific abnormal finding of lung field: Secondary | ICD-10-CM | POA: Insufficient documentation

## 2022-06-07 DIAGNOSIS — C3411 Malignant neoplasm of upper lobe, right bronchus or lung: Secondary | ICD-10-CM | POA: Diagnosis not present

## 2022-06-07 HISTORY — DX: Nausea with vomiting, unspecified: R11.2

## 2022-06-07 HISTORY — PX: BRONCHIAL NEEDLE ASPIRATION BIOPSY: SHX5106

## 2022-06-07 HISTORY — PX: VIDEO BRONCHOSCOPY WITH ENDOBRONCHIAL ULTRASOUND: SHX6177

## 2022-06-07 HISTORY — DX: Other complications of anesthesia, initial encounter: T88.59XA

## 2022-06-07 HISTORY — DX: Other specified postprocedural states: Z98.890

## 2022-06-07 HISTORY — DX: Transient cerebral ischemic attack, unspecified: G45.9

## 2022-06-07 LAB — CBC
HCT: 43.1 % (ref 36.0–46.0)
Hemoglobin: 14.3 g/dL (ref 12.0–15.0)
MCH: 28.7 pg (ref 26.0–34.0)
MCHC: 33.2 g/dL (ref 30.0–36.0)
MCV: 86.4 fL (ref 80.0–100.0)
Platelets: 331 10*3/uL (ref 150–400)
RBC: 4.99 MIL/uL (ref 3.87–5.11)
RDW: 12.8 % (ref 11.5–15.5)
WBC: 6.6 10*3/uL (ref 4.0–10.5)
nRBC: 0 % (ref 0.0–0.2)

## 2022-06-07 LAB — SARS CORONAVIRUS 2 BY RT PCR: SARS Coronavirus 2 by RT PCR: NEGATIVE

## 2022-06-07 SURGERY — BRONCHOSCOPY, WITH BIOPSY USING ELECTROMAGNETIC NAVIGATION
Anesthesia: General

## 2022-06-07 MED ORDER — DEXAMETHASONE SODIUM PHOSPHATE 10 MG/ML IJ SOLN
INTRAMUSCULAR | Status: DC | PRN
  Administered 2022-06-07: 10 mg via INTRAVENOUS

## 2022-06-07 MED ORDER — PHENYLEPHRINE 80 MCG/ML (10ML) SYRINGE FOR IV PUSH (FOR BLOOD PRESSURE SUPPORT)
PREFILLED_SYRINGE | INTRAVENOUS | Status: DC | PRN
  Administered 2022-06-07: 160 ug via INTRAVENOUS

## 2022-06-07 MED ORDER — LIDOCAINE 2% (20 MG/ML) 5 ML SYRINGE
INTRAMUSCULAR | Status: DC | PRN
  Administered 2022-06-07: 60 mg via INTRAVENOUS

## 2022-06-07 MED ORDER — PROPOFOL 10 MG/ML IV BOLUS
INTRAVENOUS | Status: DC | PRN
  Administered 2022-06-07: 160 mg via INTRAVENOUS

## 2022-06-07 MED ORDER — FENTANYL CITRATE (PF) 250 MCG/5ML IJ SOLN
INTRAMUSCULAR | Status: DC | PRN
  Administered 2022-06-07: 100 ug via INTRAVENOUS

## 2022-06-07 MED ORDER — PROPOFOL 500 MG/50ML IV EMUL
INTRAVENOUS | Status: DC | PRN
  Administered 2022-06-07: 150 ug/kg/min via INTRAVENOUS

## 2022-06-07 MED ORDER — ROCURONIUM BROMIDE 10 MG/ML (PF) SYRINGE
PREFILLED_SYRINGE | INTRAVENOUS | Status: DC | PRN
  Administered 2022-06-07: 50 mg via INTRAVENOUS

## 2022-06-07 MED ORDER — CHLORHEXIDINE GLUCONATE 0.12 % MT SOLN
OROMUCOSAL | Status: AC
  Administered 2022-06-07: 15 mL
  Filled 2022-06-07: qty 15

## 2022-06-07 MED ORDER — ONDANSETRON HCL 4 MG/2ML IJ SOLN
INTRAMUSCULAR | Status: DC | PRN
  Administered 2022-06-07: 4 mg via INTRAVENOUS

## 2022-06-07 MED ORDER — SUGAMMADEX SODIUM 200 MG/2ML IV SOLN
INTRAVENOUS | Status: DC | PRN
  Administered 2022-06-07: 200 mg via INTRAVENOUS

## 2022-06-07 MED ORDER — PROPOFOL 500 MG/50ML IV EMUL: INTRAVENOUS | Status: DC | PRN

## 2022-06-07 MED ORDER — LACTATED RINGERS IV SOLN: INTRAVENOUS | Status: DC

## 2022-06-07 NOTE — Interval H&P Note (Signed)
Patient recently noted to have two RUL lung masses on LDCT for lung cancer screening, with associated lymphadenopathy. Here for robotic assisted navigational bronchoscopy and EBUS. No change in symptoms, patient is appropriate for the procedure.  Raechel Chute, MD [facility] 06/07/2022 12:24 PM

## 2022-06-07 NOTE — Telephone Encounter (Signed)
Per Dr. Aundria Rud via epic secure chat- referral placed to oncology for lung nodule. Lm for [facility] cancer center to schedule.

## 2022-06-07 NOTE — Anesthesia Preprocedure Evaluation (Signed)
Anesthesia Evaluation  Patient identified by MRN, date of birth, ID band Patient awake    Reviewed: Allergy & Precautions, NPO status , Patient's Chart, lab work & pertinent test results  History of Anesthesia Complications (+) PONV and history of anesthetic complications  Airway Mallampati: II  TM Distance: <3 FB Neck ROM: Full    Dental  (+) Edentulous Upper, Edentulous Lower, Lower Dentures, Upper Dentures, Dental Advisory Given   Pulmonary former smoker Lung ca   breath sounds clear to auscultation       Cardiovascular negative cardio ROS  Rhythm:Regular     Neuro/Psych neg Seizures TIA negative psych ROS   GI/Hepatic negative GI ROS, Neg liver ROS,,,  Endo/Other  negative endocrine ROS    Renal/GU negative Renal ROS     Musculoskeletal negative musculoskeletal ROS (+)    Abdominal   Peds  Hematology negative hematology ROS (+)   Anesthesia Other Findings   Reproductive/Obstetrics                             Anesthesia Physical Anesthesia Plan  ASA: 2  Anesthesia Plan: General   Post-op Pain Management: Minimal or no pain anticipated   Induction: Intravenous  PONV Risk Score and Plan: 4 or greater and Ondansetron, Propofol infusion and TIVA  Airway Management Planned: Oral ETT  Additional Equipment: None  Intra-op Plan:   Post-operative Plan: Extubation in OR  Informed Consent: I have reviewed the patients History and Physical, chart, labs and discussed the procedure including the risks, benefits and alternatives for the proposed anesthesia with the patient or authorized representative who has indicated his/her understanding and acceptance.     Dental advisory given  Plan Discussed with: CRNA  Anesthesia Plan Comments:        Anesthesia Quick Evaluation

## 2022-06-07 NOTE — Anesthesia Procedure Notes (Signed)
Procedure Name: Intubation Date/Time: 06/07/2022 12:44 PM  Performed by: Cheree Ditto, CRNAPre-anesthesia Checklist: Patient identified, Emergency Drugs available, Suction available and Patient being monitored Patient Re-evaluated:Patient Re-evaluated prior to induction Oxygen Delivery Method: Circle system utilized Preoxygenation: Pre-oxygenation with 100% oxygen Induction Type: IV induction Ventilation: Mask ventilation without difficulty Laryngoscope Size: Mac and 3 Grade View: Grade I Tube type: Oral Tube size: 8.5 mm Number of attempts: 1 Airway Equipment and Method: Stylet and Oral airway Placement Confirmation: ETT inserted through vocal cords under direct vision, positive ETCO2 and breath sounds checked- equal and bilateral Secured at: 21 cm Tube secured with: Tape Dental Injury: Teeth and Oropharynx as per pre-operative assessment

## 2022-06-07 NOTE — Op Note (Signed)
Video Bronchoscopy with Robotic Assisted Bronchoscopic Navigation   Date of Operation: 06/07/2022   Pre-op Diagnosis: lung mass  Post-op Diagnosis: lung mass  Surgeon: Raechel Chute, MD  Anesthesia: General endotracheal anesthesia  Operation: Flexible video fiberoptic bronchoscopy with robotic assistance and biopsies.  Estimated Blood Loss: Minimal  Complications: None  Indications and History: Connie West is a 67 y.o. female with history of smoking, found to have a large RUL lung mass and mediastinal and hilar lymphadenopathy on LDCT for lung cancer screening. The risks, benefits, complications, treatment options and expected outcomes were discussed with the patient.  The possibilities of pneumothorax, pneumonia, reaction to medication, pulmonary aspiration, perforation of a viscus, bleeding, failure to diagnose a condition and creating a complication requiring transfusion or operation were discussed with the patient who freely signed the consent.    Description of Procedure: The patient was seen in the Preoperative Area, was examined and was deemed appropriate to proceed.  The patient was taken to [facility] endoscopy room 3, identified as Connie West and the procedure verified as Flexible Video Fiberoptic Bronchoscopy.  A Time Out was held and the above information confirmed.   Prior to the date of the procedure a high-resolution CT scan of the chest was performed. Utilizing ION software program a virtual tracheobronchial tree was generated to allow the creation of distinct navigation pathways to the patient's parenchymal abnormalities. After being taken to the operating room general anesthesia was initiated and the patient  was orally intubated. The video fiberoptic bronchoscope was introduced via the endotracheal tube and a general inspection was performed which showed normal right and left lung anatomy, aspiration of the bilateral mainstems was completed to remove any remaining secretions.  Robotic catheter inserted into patient's endotracheal tube.   Target #1 RUL: The distinct navigation pathways prepared prior to this procedure were then utilized to navigate to patient's lesion identified on CT scan. The robotic catheter was secured into place and the vision probe was withdrawn.  Lesion location was approximated using fluoroscopy and radial endobronchial ultrasound for peripheral targeting. Under fluoroscopic guidance transbronchial needle biopsies were performed to be sent for cytology. Given the mass was behind the tertiary carina, I elected to not use forceps biopsies to decrease the risk of bleeding and pneumothorax. I also did not believe that the forceps would puncture through the airway and reach the mass adequately. The FNA samples were read as adequate by the cytopathologist.  At the end of the procedure a general airway inspection was performed and there was no evidence of active bleeding. The bronchoscope was removed. I then inserted the EBUS bronchoscopy through the airway and examined the lymph node stations. Stations 11L and station 7 were both examined, noted to be enlarged with no central hilar structure. Both were biopsies using a 21G Olympus ViziShot 2 needle and samples sent for cytology. The cytopathologist read the samples as adequate on both. Given this, I elected not to proceed with further sampling of enlarged lymph node stations 4L and 4R.   The patient tolerated the procedure well. There was no significant blood loss and there were no obvious complications. A post-procedural chest x-ray is pending.  Samples Target #1: RUL 1. Transbronchial needle biopsy from the RUL  Samples Target #2: LN Station 11L 1. TBNA from 11L for cytology  Samples Target #3: LN Station 7 1. TBNA from station 7 for cytology  Plans:  The patient will be discharged from the PACU to home when recovered from anesthesia and  after chest x-ray is reviewed. We will review the cytology,  pathology and microbiology results with the patient when they become available. Outpatient followup will be with me.  Raechel Chute, MD [facility] 06/07/2022 1:38 PM

## 2022-06-07 NOTE — Transfer of Care (Signed)
Immediate Anesthesia Transfer of Care Note  Patient: [name]  Procedure(s) Performed: ROBOTIC ASSISTED NAVIGATIONAL BRONCHOSCOPY (Bilateral) VIDEO BRONCHOSCOPY WITH ENDOBRONCHIAL ULTRASOUND BRONCHIAL NEEDLE ASPIRATION BIOPSIES  Patient Location: PACU  Anesthesia Type:General  Level of Consciousness: awake, alert , and oriented  Airway & Oxygen Therapy: Patient Spontanous Breathing and Patient connected to nasal cannula oxygen  Post-op Assessment: Report given to RN and Post -op Vital signs reviewed and stable  Post vital signs: Reviewed and stable  Last Vitals:  Vitals Value Taken Time  BP 129/64 06/07/22 1345  Temp    Pulse 79 06/07/22 1345  Resp 14 06/07/22 1345  SpO2 96 % 06/07/22 1345  Vitals shown include unvalidated device data.  Last Pain:  Vitals:   06/07/22 1342  PainSc: 0-No pain         Complications: No notable events documented.

## 2022-06-10 ENCOUNTER — Telehealth: Payer: Self-pay

## 2022-06-10 ENCOUNTER — Encounter (HOSPITAL_COMMUNITY): Payer: Self-pay | Admitting: Student in an Organized Health Care Education/Training Program

## 2022-06-10 NOTE — Telephone Encounter (Signed)
Courtesy call made to new patient. No answer, left message of appointment at cancer center and location, mask policy and visitor policy.

## 2022-06-10 NOTE — Anesthesia Postprocedure Evaluation (Signed)
Anesthesia Post Note  Patient: Connie West  Procedure(s) Performed: ROBOTIC ASSISTED NAVIGATIONAL BRONCHOSCOPY (Bilateral) VIDEO BRONCHOSCOPY WITH ENDOBRONCHIAL ULTRASOUND BRONCHIAL NEEDLE ASPIRATION BIOPSIES     Patient location during evaluation: PACU Anesthesia Type: General Level of consciousness: awake and alert Pain management: pain level controlled Vital Signs Assessment: post-procedure vital signs reviewed and stable Respiratory status: spontaneous breathing, nonlabored ventilation and respiratory function stable Cardiovascular status: stable Postop Assessment: no apparent nausea or vomiting Anesthetic complications: no   No notable events documented.  Last Vitals:  Vitals:   06/07/22 1357 06/07/22 1415  BP: 130/70 124/69  Pulse: 78 80  Resp: 12 10  Temp:  37.1 C  SpO2: 97% 97%    Last Pain:  Vitals:   06/07/22 1415  PainSc: 0-No pain                 Naethan Bracewell

## 2022-06-10 NOTE — Telephone Encounter (Signed)
Appt has been scheduled for 06/11/2022 with Dr. Orlie Dakin.  Nothing further needed.

## 2022-06-11 ENCOUNTER — Other Ambulatory Visit: Payer: Self-pay | Admitting: Student in an Organized Health Care Education/Training Program

## 2022-06-11 ENCOUNTER — Encounter: Payer: Self-pay | Admitting: Oncology

## 2022-06-11 ENCOUNTER — Inpatient Hospital Stay: Payer: Medicare Other | Attending: Oncology | Admitting: Oncology

## 2022-06-11 ENCOUNTER — Encounter: Payer: Self-pay | Admitting: *Deleted

## 2022-06-11 ENCOUNTER — Inpatient Hospital Stay: Payer: Medicare Other

## 2022-06-11 VITALS — BP 138/93 | HR 94 | Temp 98.7°F | Resp 16 | Ht 66.0 in | Wt 166.0 lb

## 2022-06-11 DIAGNOSIS — C3491 Malignant neoplasm of unspecified part of right bronchus or lung: Secondary | ICD-10-CM

## 2022-06-11 DIAGNOSIS — C3411 Malignant neoplasm of upper lobe, right bronchus or lung: Secondary | ICD-10-CM | POA: Diagnosis not present

## 2022-06-11 DIAGNOSIS — R918 Other nonspecific abnormal finding of lung field: Secondary | ICD-10-CM

## 2022-06-11 LAB — CYTOLOGY - NON PAP

## 2022-06-11 NOTE — Progress Notes (Signed)
Met with patient during initial consult with Dr. Orlie Dakin to review results and discuss treatment options. All questions answered during visit. Upcoming appts reviewed with patient. Contact info given and instructed to call with any questions or needs. Pt verbalized understanding.   Per Dr. Orlie Dakin, NGS needs to be ordered on tissue sample collected from bronchoscopy to be sent for NGS. [employer] form signed and faxed.   Pt will be called with appt for port placement once scheduled. Nothing further needed at this time.

## 2022-06-11 NOTE — Progress Notes (Unsigned)
Patient went for a preventive lung cancer screening and lung nodules were found. Only has SOB with exertion. Previous smoker but quite about 9-10 years ago.

## 2022-06-12 DIAGNOSIS — C3491 Malignant neoplasm of unspecified part of right bronchus or lung: Secondary | ICD-10-CM | POA: Insufficient documentation

## 2022-06-12 NOTE — Progress Notes (Signed)
[facility]  [phone] Fax:([phone]) [phone]  ID: Connie West OB: 1955-11-24  MR#: [id number]  GUY#:[id number]  Patient Care Team: Excell Seltzer, MD as PCP - General Glory Buff, RN as Oncology Nurse Navigator  CHIEF COMPLAINT: Stage IIIb adenocarcinoma of the lung.  INTERVAL HISTORY: Patient is a 67 year old female who recently underwent routine lung cancer screening CT and was found to have a large right-sided.  She is anxious, but otherwise feels well.  She has no neurologic complaints.  She denies any recent fevers or illnesses.  She has a good appetite and denies weight loss.  She has no chest pain, shortness of breath, cough, or hemoptysis.  She denies any nausea, vomiting, constipation, or diarrhea.  She has no urinary complaints.  Patient offers no further specific complaints today.  REVIEW OF SYSTEMS:   Review of Systems  Constitutional: Negative.  Negative for fever, malaise/fatigue and weight loss.  Respiratory: Negative.  Negative for cough, hemoptysis and shortness of breath.   Cardiovascular: Negative.  Negative for chest pain and leg swelling.  Gastrointestinal: Negative.  Negative for abdominal pain.  Genitourinary: Negative.  Negative for dysuria.  Musculoskeletal: Negative.  Negative for back pain.  Skin: Negative.  Negative for rash.  Neurological: Negative.  Negative for dizziness, focal weakness, weakness and headaches.  Psychiatric/Behavioral:  The patient is nervous/anxious.     As per HPI. Otherwise, a complete review of systems is negative.  PAST MEDICAL HISTORY: Past Medical History:  Diagnosis Date   Complication of anesthesia    Hyperlipidemia    PONV (postoperative nausea and vomiting)    TIA (transient ischemic attack)     PAST SURGICAL HISTORY: Past Surgical History:  Procedure Laterality Date   BREAST CYST ASPIRATION Right 07/20/2012   FNA benign   BREAST CYST ASPIRATION Right    BREAST SURGERY Right 07-20-12    FNA benign   BRONCHIAL NEEDLE ASPIRATION BIOPSY  06/07/2022   Procedure: BRONCHIAL NEEDLE ASPIRATION BIOPSIES;  Surgeon: Raechel Chute, MD;  Location: [facility];  Service: Pulmonary;;   CESAREAN SECTION     CHOLECYSTECTOMY     COLONOSCOPY WITH PROPOFOL N/A 03/21/2021   Procedure: COLONOSCOPY WITH PROPOFOL;  Surgeon: Toney Reil, MD;  Location: [facility];  Service: Gastroenterology;  Laterality: N/A;   OVARY SURGERY     VIDEO BRONCHOSCOPY WITH ENDOBRONCHIAL ULTRASOUND N/A 06/07/2022   Procedure: VIDEO BRONCHOSCOPY WITH ENDOBRONCHIAL ULTRASOUND;  Surgeon: Raechel Chute, MD;  Location: [facility];  Service: Pulmonary;  Laterality: N/A;    FAMILY HISTORY: Family History  Problem Relation Age of Onset   Breast cancer Mother 75   Cancer Mother        breast   Cancer Father        bone cancer    ADVANCED DIRECTIVES (Y/N):  N  HEALTH MAINTENANCE: Social History   Tobacco Use   Smoking status: Former    Packs/day: 0.62    Years: 43.00    Additional pack years: 0.00    Total pack years: 26.66    Types: Cigarettes    Quit date: 2016    Years since quitting: 8.3   Smokeless tobacco: Never   Tobacco comments:    quit x 1 month 11/16  Vaping Use   Vaping Use: Never used  Substance Use Topics   Alcohol use: Yes    Comment: occasional   Drug use: No     Colonoscopy:  PAP:  Bone density:  Lipid panel:  Allergies  Allergen  Reactions   Penicillins Hives    Current Outpatient Medications  Medication Sig Dispense Refill   aspirin EC 81 MG tablet Take 81 mg by mouth in the morning.     calcium carbonate (OS-CAL) 600 MG TABS Take 600 mg by mouth every evening.     Coenzyme Q10 (CO Q 10 PO) Take 1 capsule by mouth in the morning.     Fluocinolone Acetonide 0.01 % OIL Apply twice daily to ears as needed for rash/itching 20 mL 2   ibuprofen (ADVIL,MOTRIN) 200 MG tablet Take 400 mg by mouth every 8 (eight) hours as needed (pain.).     ketoconazole (NIZORAL) 2 %  shampoo 2-3 times per week lather on scalp and ears, leave on 8-10 minutes, rinse well (Patient taking differently: Apply 1 Application topically once a week. Once per week lather on scalp and ears, leave on 8-10 minutes, rinse well) 120 mL 5   MAGNESIUM PO Take 1 tablet by mouth every evening.     OMEGA-3 FATTY ACIDS PO Take 1 g by mouth every evening.     simvastatin (ZOCOR) 40 MG tablet TAKE 1 TABLET BY MOUTH EVERY DAY 90 tablet 3   VITAMIN D PO Take 1,000 Units by mouth in the morning.     No current facility-administered medications for this visit.    OBJECTIVE: Vitals:   06/11/22 1117  BP: (!) 138/93  Pulse: 94  Resp: 16  Temp: 98.7 F (37.1 C)  SpO2: 97%     Body mass index is 26.79 kg/m.    ECOG FS:0 - Asymptomatic  General: Well-developed, well-nourished, no acute distress. Eyes: Pink conjunctiva, anicteric sclera. HEENT: Normocephalic, moist mucous membranes. Lungs: No audible wheezing or coughing. Heart: Regular rate and rhythm. Abdomen: Soft, nontender, no obvious distention. Musculoskeletal: No edema, cyanosis, or clubbing. Neuro: Alert, answering all questions appropriately. Cranial nerves grossly intact. Skin: No rashes or petechiae noted. Psych: Normal affect. Lymphatics: No cervical, calvicular, axillary or inguinal LAD.   LAB RESULTS:  Lab Results  Component Value Date   NA 138 01/21/2022   K 4.2 01/21/2022   CL 101 01/21/2022   CO2 27 01/21/2022   GLUCOSE 109 (H) 01/21/2022   BUN 14 01/21/2022   CREATININE 0.97 01/21/2022   CALCIUM 10.0 01/21/2022   PROT 7.4 01/21/2022   ALBUMIN 4.5 01/21/2022   AST 22 01/21/2022   ALT 19 01/21/2022   ALKPHOS 81 01/21/2022   BILITOT 0.5 01/21/2022   GFRNONAA >60 01/04/2018   GFRAA >60 01/04/2018    Lab Results  Component Value Date   WBC 6.6 06/07/2022   NEUTROABS 4.3 02/01/2022   HGB 14.3 06/07/2022   HCT 43.1 06/07/2022   MCV 86.4 06/07/2022   PLT 331 06/07/2022     STUDIES: DG Chest Port 1  View  Result Date: 06/07/2022 CLINICAL DATA:  S/P bronchoscopy with biopsy EXAM: PORTABLE CHEST 1 VIEW COMPARISON:  CT of the chest May 15, 2022. FINDINGS: Right upper lobe masses with adenopathy, similar when comparing across modalities to prior CT chest. No visible pneumothorax on this limited semi erect radiograph. Left lung is clear. IMPRESSION: Right upper lobe masses with adenopathy, similar when comparing across modalities to prior CT chest. No visible pneumothorax on this limited semi erect radiograph. Electronically Signed   By: Feliberto Harts M.D.   On: 06/07/2022 14:07   DG C-ARM BRONCHOSCOPY  Result Date: 06/07/2022 C-ARM BRONCHOSCOPY: Fluoroscopy was utilized by the requesting physician.  No radiographic interpretation.   CT SUPER  D CHEST WO MONARCH PILOT  Result Date: 06/05/2022 CLINICAL DATA:  Pulmonary masses.  * Tracking Code: BO * EXAM: CT CHEST WITHOUT CONTRAST TECHNIQUE: Multidetector CT imaging of the chest was performed using thin slice collimation for electromagnetic bronchoscopy planning purposes, without intravenous contrast. RADIATION DOSE REDUCTION: This exam was performed according to the departmental dose-optimization program which includes automated exposure control, adjustment of the mA and/or kV according to patient size and/or use of iterative reconstruction technique. COMPARISON:  PET 05/22/2022, CT chest 05/15/2022. FINDINGS: Cardiovascular: Atherosclerotic calcification of the aorta and left anterior descending coronary artery. Heart size normal. No pericardial effusion. Mediastinum/Nodes: Enlarged thoracic inlet lymph nodes measure up to 11 mm in the right level III station (2/10). Mediastinal adenopathy measures up to 1.5 cm in the low left paratracheal station. Hilar regions are difficult to evaluate without IV contrast. No axillary adenopathy. Esophagus is grossly unremarkable. Lungs/Pleura: Right upper lobe masses measure 3.4 x 3.9 cm medially and 5.1 x 5.4 cm  inferolaterally, as on recent prior exams. Surrounding ground-glass and septal thickening. Additional peribronchial thickening, septal thickening and ground-glass in the right lower lobe, similar. No pleural fluid. Airway is unremarkable. Upper Abdomen: Visualized portions of the liver, adrenal glands, kidneys, spleen, pancreas, stomach and bowel are grossly unremarkable. Cholecystectomy. No upper abdominal adenopathy. Musculoskeletal: Osteopenia.  Degenerative changes in the spine. IMPRESSION: 1. Right upper lobe masses with adenopathy extending to level III cervical nodes, indicative of stage IV bronchogenic carcinoma. 2. Surrounding ground-glass and septal thickening in the right upper lobe with peribronchial thickening, septal thickening and ground-glass in the right lower lobe, findings worrisome for lymphangitic carcinomatosis. 3. Aortic atherosclerosis (ICD10-I70.0). Left anterior descending coronary artery calcification. Electronically Signed   By: Leanna Battles M.D.   On: 06/05/2022 13:46   NM PET Image Initial (PI) Skull Base To Thigh  Result Date: 05/24/2022 CLINICAL DATA:  Initial treatment strategy for lung nodules. EXAM: NUCLEAR MEDICINE PET SKULL BASE TO THIGH TECHNIQUE: 9.1 mCi F-18 FDG was injected intravenously. Full-ring PET imaging was performed from the skull base to thigh after the radiotracer. CT data was obtained and used for attenuation correction and anatomic localization. Fasting blood glucose: 98 mg/dl COMPARISON:  CT chest 16/11/9602 FINDINGS: Mediastinal blood pool activity: SUV max 2.6 Liver activity: SUV max NA NECK: Bilateral level III lymph nodes measure up to 12 mm on the right (4/32), SUV max 14.7. Incidental CT findings: None. CHEST: Extensive hypermetabolic thoracic inlet and mediastinal and hilar adenopathy. Index low left paratracheal lymph node measures 1.6 cm (4/48), SUV max 16.2. Hypermetabolic masses in the right upper lobe measure up to 4.9 x 5.0 cm, SUV max 18.3.  Incidental CT findings: Atherosclerotic calcification of the aorta. Heart is at the upper limits of normal in size. No pericardial or pleural effusion. ABDOMEN/PELVIS: No abnormal hypermetabolism. Incidental CT findings: Liver is unremarkable. Cholecystectomy. Adrenal glands, kidneys, spleen, pancreas, stomach and bowel are grossly unremarkable. SKELETON: No abnormal hypermetabolism. Incidental CT findings: Degenerative changes in the spine. IMPRESSION: 1. Primary bronchogenic carcinoma, at least stage T2b N3 M0 or 3 B disease. However, hypermetabolic level III cervical lymph nodes are worrisome for stage IV disease. 2.  Aortic atherosclerosis (ICD10-I70.0). Electronically Signed   By: Leanna Battles M.D.   On: 05/24/2022 10:45   CT CHEST LUNG CA SCREEN LOW DOSE W/O CM  Result Date: 05/15/2022 CLINICAL DATA:  Twenty-seven pack-year smoking history/quit 8 years ago. * Tracking Code: BO * EXAM: CT CHEST WITHOUT CONTRAST LOW-DOSE FOR LUNG CANCER SCREENING TECHNIQUE:  Multidetector CT imaging of the chest was performed following the standard protocol without IV contrast. RADIATION DOSE REDUCTION: This exam was performed according to the departmental dose-optimization program which includes automated exposure control, adjustment of the mA and/or kV according to patient size and/or use of iterative reconstruction technique. COMPARISON:  12/17/2007 diagnostic CT. FINDINGS: Cardiovascular: Aortic atherosclerosis. Normal heart size, without pericardial effusion. Mediastinum/Nodes: Bilateral low jugular/supraclavicular nodes, including on the right at 1.0 cm on 08/02, suspicious. Subcarinal adenopathy at 1.8 cm in 27/2. Prevascular nodes are upper normal including at 8 mm on 21/2, but new since the remote CT. Lungs/Pleura: No pleural fluid. Moderate centrilobular emphysema. 2 right upper lobe lung masses. The more superior and medial measures volume derived equivalent diameter 42.5 mm on 86/3. The more inferior and lateral  measures volume derived equivalent diameter 53.3 mm on 108/3. Smaller pulmonary nodules including at up to volume derived equivalent diameter 3.7 mm. Areas of septal thickening within the right upper and right lower lobes. Upper Abdomen: Cholecystectomy. Normal imaged portions of the liver, spleen, stomach, pancreas, adrenal glands, kidneys. Musculoskeletal: Mild osteopenia. T5-6 spondylosis and degenerative disc disease. IMPRESSION: 1. Lung-RADS 4X, highly suspicious. Additional imaging evaluation or consultation with Pulmonology or Thoracic Surgery recommended. Right upper lobe lung masses, most consistent with synchronous primary bronchogenic carcinomas. Adenopathy within the chest and lower neck, most consistent with nodal metastasis. 2. Right sided interlobular septal thickening, suspicious for lymphangitic tumor spread. 3. Aortic atherosclerosis (ICD10-I70.0) and emphysema (ICD10-J43.9). These results will be called to the ordering clinician or representative by the Radiologist Assistant, and communication documented in the PACS or [employer]. Electronically Signed   By: Jeronimo Greaves M.D.   On: 05/15/2022 16:45   ASSESSMENT: Stage IIIb adenocarcinoma of the lung.  PLAN:    Stage IIIb adenocarcinoma of the lung: Biopsy from bronchoscopy on June 07, 2022 confirming the diagnosis.  PET scan results from May 22, 2022 reviewed independently and reported as above confirming stage of disease.  Bilateral hypermetabolic cervical lymph nodes are suspicious, but will treat patient as a stage IIIb.  Will get an MRI of the brain to complete the staging workup.  Plan to give concurrent XRT along with weekly carboplatin and Taxol for 6-8 cycles this will then be followed by maintenance immunotherapy for 1 year.  Return to clinic in approximately 1 week for further evaluation, treatment planning, and consultation with radiation oncology.  I spent a total of 60 minutes reviewing chart data, face-to-face  evaluation with the patient, counseling and coordination of care as detailed above.   Patient expressed understanding and was in agreement with this plan. She also understands that She can call clinic at any time with any questions, concerns, or complaints.    Cancer Staging  Adenocarcinoma of right lung [facility]) Staging form: Lung, AJCC 8th Edition - Clinical stage from 06/12/2022: Stage IIIB (cT2b, cN3, cM0) - Signed by Jeralyn Ruths, MD on 06/12/2022 Stage prefix: Initial diagnosis   Jeralyn Ruths, MD   06/12/2022 1:00 PM

## 2022-06-13 ENCOUNTER — Encounter: Payer: Self-pay | Admitting: *Deleted

## 2022-06-14 NOTE — H&P (Signed)
Chief Complaint: Patient was seen in consultation today for stage IIIb adenocarcinoma of the lung at the request of Finnegan,Timothy J  Referring Physician(s): Finnegan,Timothy J  Supervising Physician: Malachy Moan  Patient Status: [facility] - Out-pt  History of Present Illness: Connie West is a 67 y.o. female who recently underwent routine lung cancer screening CT that demonstrated right upper lobe lung masses consistent with primary bronchogenic carcinoma and adenopathy within the chest and lower neck most consistent with nodal metastasis.  Patient was referred by oncology to IR for tunneled catheter with port placement.  Patient denies fever, loss of appetite, CP, abdominal pain, N/V, dizziness, headaches or weakness. She endorses fatigue and DOE. She is n.p.o. per order  Past Medical History:  Diagnosis Date   Complication of anesthesia    Hyperlipidemia    PONV (postoperative nausea and vomiting)    TIA (transient ischemic attack)     Past Surgical History:  Procedure Laterality Date   BREAST CYST ASPIRATION Right 07/20/2012   FNA benign   BREAST CYST ASPIRATION Right    BREAST SURGERY Right 07-20-12   FNA benign   BRONCHIAL NEEDLE ASPIRATION BIOPSY  06/07/2022   Procedure: BRONCHIAL NEEDLE ASPIRATION BIOPSIES;  Surgeon: Raechel Chute, MD;  Location: [facility];  Service: Pulmonary;;   CESAREAN SECTION     CHOLECYSTECTOMY     COLONOSCOPY WITH PROPOFOL N/A 03/21/2021   Procedure: COLONOSCOPY WITH PROPOFOL;  Surgeon: Toney Reil, MD;  Location: [facility] ENDOSCOPY;  Service: Gastroenterology;  Laterality: N/A;   OVARY SURGERY     VIDEO BRONCHOSCOPY WITH ENDOBRONCHIAL ULTRASOUND N/A 06/07/2022   Procedure: VIDEO BRONCHOSCOPY WITH ENDOBRONCHIAL ULTRASOUND;  Surgeon: Raechel Chute, MD;  Location: [facility];  Service: Pulmonary;  Laterality: N/A;    Allergies: Penicillins  Medications: Prior to Admission medications   Medication Sig Start Date End Date  Taking? Authorizing Provider  aspirin EC 81 MG tablet Take 81 mg by mouth in the morning.    [provider]  calcium carbonate (OS-CAL) 600 MG TABS Take 600 mg by mouth every evening.    [provider]  Coenzyme Q10 (CO Q 10 PO) Take 1 capsule by mouth in the morning.    [provider]  Fluocinolone Acetonide 0.01 % OIL Apply twice daily to ears as needed for rash/itching 05/02/22   Willeen Niece, MD  ibuprofen (ADVIL,MOTRIN) 200 MG tablet Take 400 mg by mouth every 8 (eight) hours as needed (pain.).    [provider]  ketoconazole (NIZORAL) 2 % shampoo 2-3 times per week lather on scalp and ears, leave on 8-10 minutes, rinse well Patient taking differently: Apply 1 Application topically once a week. Once per week lather on scalp and ears, leave on 8-10 minutes, rinse well 05/02/22   Willeen Niece, MD  MAGNESIUM PO Take 1 tablet by mouth every evening.    [provider]  OMEGA-3 FATTY ACIDS PO Take 1 g by mouth every evening.    [provider]  simvastatin (ZOCOR) 40 MG tablet TAKE 1 TABLET BY MOUTH EVERY DAY 04/18/22   Bedsole, Amy E, MD  VITAMIN D PO Take 1,000 Units by mouth in the morning.    [provider]     Family History  Problem Relation Age of Onset   Breast cancer Mother 43   Cancer Mother        breast   Cancer Father        bone cancer    Social History  Socioeconomic History   Marital status: Divorced    Spouse name: Not on file   Number of children: 2   Years of education: Not on file   Highest education level: Not on file  Occupational History   Occupation: Futures trader    Employer: [employer]  Tobacco Use   Smoking status: Former    Packs/day: 0.62    Years: 43.00    Additional pack years: 0.00    Total pack years: 26.66    Types: Cigarettes    Quit date: 2016    Years since quitting: 8.3   Smokeless tobacco: Never   Tobacco comments:    quit x 1 month 11/16  Vaping Use   Vaping Use:  Never used  Substance and Sexual Activity   Alcohol use: Yes    Comment: occasional : 1x/week   Drug use: No   Sexual activity: Not on file  Other Topics Concern   Not on file  Social History Narrative   From [place]         Social Determinants of Health   Financial Resource Strain: Low Risk  (06/11/2022)   Overall Financial Resource Strain (CARDIA)    Difficulty of Paying Living Expenses: Not hard at all  Food Insecurity: No Food Insecurity (06/11/2022)   Hunger Vital Sign    Worried About Running Out of Food in the Last Year: Never true    Ran Out of Food in the Last Year: Never true  Transportation Needs: No Transportation Needs (06/11/2022)   PRAPARE - Administrator, Civil Service (Medical): No    Lack of Transportation (Non-Medical): No  Physical Activity: Not on file  Stress: Not on file  Social Connections: Not on file     Review of Systems: A 12 point ROS discussed and pertinent positives are indicated in the HPI above.  All other systems are negative.  Review of Systems  Constitutional:  Positive for fatigue. Negative for appetite change and fever.  Respiratory:  Positive for shortness of breath.   Cardiovascular:  Negative for chest pain.  Gastrointestinal:  Negative for abdominal pain, nausea and vomiting.  Neurological:  Negative for dizziness, weakness and headaches.    Vital Signs: BP (!) 152/99   Pulse 94   Temp 98 F (36.7 C) (Oral)   Resp 20   Ht 5\' 6"  (1.676 m)   Wt 165 lb (74.8 kg)   SpO2 95%   BMI 26.63 kg/m     Physical Exam Vitals reviewed.  Constitutional:      General: She is not in acute distress.    Appearance: Normal appearance. She is not ill-appearing.  HENT:     Head: Normocephalic and atraumatic.     Mouth/Throat:     Mouth: Mucous membranes are dry.     Pharynx: Oropharynx is clear.  Eyes:     Extraocular Movements: Extraocular movements intact.     Pupils: Pupils are equal, round, and reactive to light.   Cardiovascular:     Rate and Rhythm: Normal rate and regular rhythm.     Pulses: Normal pulses.     Heart sounds: Normal heart sounds.  Pulmonary:     Effort: Pulmonary effort is normal. No respiratory distress.     Breath sounds: Normal breath sounds.  Abdominal:     General: Bowel sounds are normal. There is no distension.     Palpations: Abdomen is soft.     Tenderness: There is no abdominal tenderness. There is  no guarding.  Musculoskeletal:     Right lower leg: No edema.     Left lower leg: No edema.  Skin:    General: Skin is warm and dry.  Neurological:     Mental Status: She is alert and oriented to person, place, and time.  Psychiatric:        Mood and Affect: Mood normal.        Behavior: Behavior normal.        Thought Content: Thought content normal.        Judgment: Judgment normal.     Imaging: MR Brain W Wo Contrast  Result Date: 06/19/2022 CLINICAL DATA:  Metastatic disease evaluation.  Lung cancer. EXAM: MRI HEAD WITHOUT AND WITH CONTRAST TECHNIQUE: Multiplanar, multiecho pulse sequences of the brain and surrounding structures were obtained without and with intravenous contrast. CONTRAST:  7mL GADAVIST GADOBUTROL 1 MMOL/ML IV SOLN COMPARISON:  01/04/2018 FINDINGS: Brain: Diffusion imaging does not show any acute or subacute infarction or other cause of restricted diffusion. The brainstem is normal. There is an old small vessel cerebellar stroke on the left. Cerebral hemispheres show mild chronic small-vessel ischemic changes of the white matter. No cortical or large vessel territory infarction. No evidence of primary or metastatic mass lesion. No hemorrhage, hydrocephalus or extra-axial collection. After contrast administration, no abnormal enhancement occurs. Vascular: Major vessels at the base of the brain show flow. Skull and upper cervical spine: Negative Sinuses/Orbits: Clear/normal Other: None IMPRESSION: 1. No evidence of metastatic disease. 2. Mild chronic  small-vessel ischemic change of the cerebral hemispheric white matter. 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 Electronically Signed   By: Paulina Fusi M.D.   On: 06/19/2022 09:18   DG Chest Port 1 View  Result Date: 06/07/2022 CLINICAL DATA:  S/P bronchoscopy with biopsy EXAM: PORTABLE CHEST 1 VIEW COMPARISON:  CT of the chest May 15, 2022. FINDINGS: Right upper lobe masses with adenopathy, similar when comparing across modalities to prior CT chest. No visible pneumothorax on this limited semi erect radiograph. Left lung is clear. IMPRESSION: Right upper lobe masses with adenopathy, similar when comparing across modalities to prior CT chest. No visible pneumothorax on this limited semi erect radiograph. Electronically Signed   By: Feliberto Harts M.D.   On: 06/07/2022 14:07   DG C-ARM BRONCHOSCOPY  Result Date: 06/07/2022 C-ARM BRONCHOSCOPY: Fluoroscopy was utilized by the requesting physician.  No radiographic interpretation.   CT SUPER D CHEST WO MONARCH PILOT  Result Date: 06/05/2022 CLINICAL DATA:  Pulmonary masses.  * Tracking Code: BO * EXAM: CT CHEST WITHOUT CONTRAST TECHNIQUE: Multidetector CT imaging of the chest was performed using thin slice collimation for electromagnetic bronchoscopy planning purposes, without intravenous contrast. RADIATION DOSE REDUCTION: This exam was performed according to the departmental dose-optimization program which includes automated exposure control, adjustment of the mA and/or kV according to patient size and/or use of iterative reconstruction technique. COMPARISON:  PET 05/22/2022, CT chest 05/15/2022. FINDINGS: Cardiovascular: Atherosclerotic calcification of the aorta and left anterior descending coronary artery. Heart size normal. No pericardial effusion. Mediastinum/Nodes: Enlarged thoracic inlet lymph nodes measure up to 11 mm in the right level III station (2/10). Mediastinal adenopathy measures up to 1.5 cm in the low left paratracheal  station. Hilar regions are difficult to evaluate without IV contrast. No axillary adenopathy. Esophagus is grossly unremarkable. Lungs/Pleura: Right upper lobe masses measure 3.4 x 3.9 cm medially and 5.1 x 5.4 cm inferolaterally, as on recent prior exams. Surrounding ground-glass and septal thickening.  Additional peribronchial thickening, septal thickening and ground-glass in the right lower lobe, similar. No pleural fluid. Airway is unremarkable. Upper Abdomen: Visualized portions of the liver, adrenal glands, kidneys, spleen, pancreas, stomach and bowel are grossly unremarkable. Cholecystectomy. No upper abdominal adenopathy. Musculoskeletal: Osteopenia.  Degenerative changes in the spine. IMPRESSION: 1. Right upper lobe masses with adenopathy extending to level III cervical nodes, indicative of stage IV bronchogenic carcinoma. 2. Surrounding ground-glass and septal thickening in the right upper lobe with peribronchial thickening, septal thickening and ground-glass in the right lower lobe, findings worrisome for lymphangitic carcinomatosis. 3. Aortic atherosclerosis (ICD10-I70.0). Left anterior descending coronary artery calcification. Electronically Signed   By: Leanna Battles M.D.   On: 06/05/2022 13:46   NM PET Image Initial (PI) Skull Base To Thigh  Result Date: 05/24/2022 CLINICAL DATA:  Initial treatment strategy for lung nodules. EXAM: NUCLEAR MEDICINE PET SKULL BASE TO THIGH TECHNIQUE: 9.1 mCi F-18 FDG was injected intravenously. Full-ring PET imaging was performed from the skull base to thigh after the radiotracer. CT data was obtained and used for attenuation correction and anatomic localization. Fasting blood glucose: 98 mg/dl COMPARISON:  CT chest 16/11/9602 FINDINGS: Mediastinal blood pool activity: SUV max 2.6 Liver activity: SUV max NA NECK: Bilateral level III lymph nodes measure up to 12 mm on the right (4/32), SUV max 14.7. Incidental CT findings: None. CHEST: Extensive hypermetabolic  thoracic inlet and mediastinal and hilar adenopathy. Index low left paratracheal lymph node measures 1.6 cm (4/48), SUV max 16.2. Hypermetabolic masses in the right upper lobe measure up to 4.9 x 5.0 cm, SUV max 18.3. Incidental CT findings: Atherosclerotic calcification of the aorta. Heart is at the upper limits of normal in size. No pericardial or pleural effusion. ABDOMEN/PELVIS: No abnormal hypermetabolism. Incidental CT findings: Liver is unremarkable. Cholecystectomy. Adrenal glands, kidneys, spleen, pancreas, stomach and bowel are grossly unremarkable. SKELETON: No abnormal hypermetabolism. Incidental CT findings: Degenerative changes in the spine. IMPRESSION: 1. Primary bronchogenic carcinoma, at least stage T2b N3 M0 or 3 B disease. However, hypermetabolic level III cervical lymph nodes are worrisome for stage IV disease. 2.  Aortic atherosclerosis (ICD10-I70.0). Electronically Signed   By: Leanna Battles M.D.   On: 05/24/2022 10:45    Labs:  CBC: Recent Labs    02/01/22 1023 06/07/22 1041  WBC 7.2 6.6  HGB 13.8 14.3  HCT 40.9 43.1  PLT 330.0 331    COAGS: No results for input(s): "INR", "APTT" in the last 8760 hours.  BMP: Recent Labs    01/21/22 0802  NA 138  K 4.2  CL 101  CO2 27  GLUCOSE 109*  BUN 14  CALCIUM 10.0  CREATININE 0.97    LIVER FUNCTION TESTS: Recent Labs    01/21/22 0802  BILITOT 0.5  AST 22  ALT 19  ALKPHOS 81  PROT 7.4  ALBUMIN 4.5    TUMOR MARKERS: No results for input(s): "AFPTM", "CEA", "CA199", "CHROMGRNA" in the last 8760 hours.  Assessment and Plan:  67 year old female with PMHx significant for tobacco use, HLD, PONV, TIA and recently diagnosed right stage IIIb adenocarcinoma of lung presents to IR for tunneled catheter with port placement.  Patient resting on stretcher with husband at bedside. She is alert and oriented, calm and pleasant. She is in no distress.  Risks and benefits of image guided tunneled catheter with port  placement with moderate sedation was discussed with the patient including, but not limited to bleeding, infection, pneumothorax, or fibrin sheath development and need for additional procedures.  All of the patient's questions were answered, patient is agreeable to proceed. Consent signed and in chart.  Thank you for this interesting consult.  I greatly enjoyed meeting Connie West and look forward to participating in their care.  A copy of this report was sent to the requesting provider on this date.  Electronically Signed: Shon Hough, NP 06/21/2022, 11:14 AM   I spent a total of 20 minutes in face to face in clinical consultation, greater than 50% of which was counseling/coordinating care for stage IIIb adenocarcinoma of the lung.

## 2022-06-17 ENCOUNTER — Ambulatory Visit
Admission: RE | Admit: 2022-06-17 | Discharge: 2022-06-17 | Disposition: A | Discharge status: Home / Self Care | Payer: Medicare Other | Source: Ambulatory Visit | Attending: Oncology | Admitting: Oncology

## 2022-06-17 DIAGNOSIS — R918 Other nonspecific abnormal finding of lung field: Secondary | ICD-10-CM | POA: Diagnosis not present

## 2022-06-17 DIAGNOSIS — I639 Cerebral infarction, unspecified: Secondary | ICD-10-CM | POA: Diagnosis not present

## 2022-06-17 MED ORDER — GADOBUTROL 1 MMOL/ML IV SOLN
7.0000 mL | Freq: Once | INTRAVENOUS | Status: AC | PRN
  Administered 2022-06-17: 7 mL via INTRAVENOUS

## 2022-06-18 ENCOUNTER — Other Ambulatory Visit (HOSPITAL_COMMUNITY): Payer: Self-pay | Admitting: Student

## 2022-06-19 ENCOUNTER — Inpatient Hospital Stay: Payer: Medicare Other | Attending: Oncology | Admitting: Oncology

## 2022-06-19 ENCOUNTER — Ambulatory Visit: Payer: Medicare Other | Admitting: Oncology

## 2022-06-19 ENCOUNTER — Encounter: Payer: Self-pay | Admitting: Oncology

## 2022-06-19 ENCOUNTER — Ambulatory Visit
Admission: RE | Admit: 2022-06-19 | Discharge: 2022-06-19 | Disposition: A | Discharge status: Home / Self Care | Payer: Medicare Other | Source: Ambulatory Visit | Attending: Radiation Oncology | Admitting: Radiation Oncology

## 2022-06-19 ENCOUNTER — Encounter: Payer: Self-pay | Admitting: *Deleted

## 2022-06-19 ENCOUNTER — Encounter: Payer: Self-pay | Admitting: Radiation Oncology

## 2022-06-19 VITALS — BP 128/88 | HR 95 | Resp 18 | Ht 66.0 in | Wt 165.0 lb

## 2022-06-19 DIAGNOSIS — Z510 Encounter for antineoplastic radiation therapy: Secondary | ICD-10-CM | POA: Insufficient documentation

## 2022-06-19 DIAGNOSIS — E871 Hypo-osmolality and hyponatremia: Secondary | ICD-10-CM | POA: Diagnosis not present

## 2022-06-19 DIAGNOSIS — Z87891 Personal history of nicotine dependence: Secondary | ICD-10-CM | POA: Diagnosis not present

## 2022-06-19 DIAGNOSIS — C3411 Malignant neoplasm of upper lobe, right bronchus or lung: Secondary | ICD-10-CM | POA: Insufficient documentation

## 2022-06-19 DIAGNOSIS — Z8673 Personal history of transient ischemic attack (TIA), and cerebral infarction without residual deficits: Secondary | ICD-10-CM | POA: Insufficient documentation

## 2022-06-19 DIAGNOSIS — C3491 Malignant neoplasm of unspecified part of right bronchus or lung: Secondary | ICD-10-CM

## 2022-06-19 DIAGNOSIS — I10 Essential (primary) hypertension: Secondary | ICD-10-CM | POA: Diagnosis not present

## 2022-06-19 DIAGNOSIS — I6782 Cerebral ischemia: Secondary | ICD-10-CM | POA: Diagnosis not present

## 2022-06-19 DIAGNOSIS — Z5111 Encounter for antineoplastic chemotherapy: Secondary | ICD-10-CM | POA: Insufficient documentation

## 2022-06-19 MED ORDER — ONDANSETRON HCL 8 MG PO TABS
8.0000 mg | ORAL_TABLET | Freq: Three times a day (TID) | ORAL | 1 refills | Status: DC | PRN
Start: 2022-06-19 — End: 2022-07-31

## 2022-06-19 MED ORDER — PROCHLORPERAZINE MALEATE 10 MG PO TABS
10.0000 mg | ORAL_TABLET | Freq: Four times a day (QID) | ORAL | 1 refills | Status: DC | PRN
Start: 2022-06-19 — End: 2022-07-31

## 2022-06-19 MED ORDER — LIDOCAINE-PRILOCAINE 2.5-2.5 % EX CREA
TOPICAL_CREAM | CUTANEOUS | 3 refills | Status: DC
Start: 2022-06-19 — End: 2022-07-31

## 2022-06-19 NOTE — Progress Notes (Signed)
Met with patient during follow up visit with Dr. Orlie Dakin and initial consult with Dr. Rushie Chestnut. All questions answered during visit. Reviewed upcoming appts. FMLA forms completed and awaiting MD signature for pt's daughter, Connie West. Will fax to employer once signed by MD. Nothing further needed at this. Instructed pt to call with any questions or needs. Pt verbalized understanding.

## 2022-06-19 NOTE — Progress Notes (Signed)
START ON PATHWAY REGIMEN - Non-Small Cell Lung     A cycle is every 7 days, concurrent with RT:     Paclitaxel      Carboplatin   **Always confirm dose/schedule in your pharmacy ordering system**  Patient Characteristics: Preoperative or Nonsurgical Candidate (Clinical Staging), Stage III - Nonsurgical Candidate (Nonsquamous and Squamous), PS = 0, 1 Therapeutic Status: Preoperative or Nonsurgical Candidate (Clinical Staging) AJCC T Category: cT2b AJCC N Category: cN3 AJCC M Category: cM0 AJCC 8 Stage Grouping: IIIB ECOG Performance Status: 0 Intent of Therapy: Curative Intent, Discussed with Patient

## 2022-06-19 NOTE — Consult Note (Signed)
NEW PATIENT EVALUATION  Name: Connie West  MRN: [id number]  Date:   06/19/2022     DOB: Aug 28, 1955   This 67 y.o. female patient presents to the clinic for initial evaluation of stage IIIb (T2b N3 M0) adenocarcinoma of the right lung.  REFERRING PHYSICIAN: Excell Seltzer, MD  CHIEF COMPLAINT: No chief complaint on file.   DIAGNOSIS: The encounter diagnosis was Adenocarcinoma of right lung (HCC).   PREVIOUS INVESTIGATIONS:  MRI of brain PET CT scan and CT scans reviewed Pathology reports reviewed Clinical notes reviewed  HPI: Patient is a 67 year old female who presented with highly suspicious screening CT scan of her chest with a right upper lung mass as well as findings consistent with adenopathy chest and lower neck all consistent with primary bronchogenic carcinoma.  PET CT scan showed findings consistent with hypermetabolic activity in chest lower cervical chain making this a stage IIIb carcinoma.  There is also hypermetabolic activity level of his 3 cervical nodes which all can be encompassed with our treatment fields.  She underwent bronchoscopy with cytology positive for adenocarcinoma.  MRI of her brain was unremarkable for metastatic disease.  She has a mild nonproductive cough no bone pain no hemoptysis.  She is having no dysphagia or hemoptysis.  She has been seen by medical oncology and a port is to be placed this week.  She is now referred to radiation oncology for opinion.  PLANNED TREATMENT REGIMEN: Concurrent chemoradiation  PAST MEDICAL HISTORY:  has a past medical history of Complication of anesthesia, Hyperlipidemia, PONV (postoperative nausea and vomiting), and TIA (transient ischemic attack).    PAST SURGICAL HISTORY:  Past Surgical History:  Procedure Laterality Date   BREAST CYST ASPIRATION Right 07/20/2012   FNA benign   BREAST CYST ASPIRATION Right    BREAST SURGERY Right 07-20-12   FNA benign   BRONCHIAL NEEDLE ASPIRATION BIOPSY  06/07/2022   Procedure:  BRONCHIAL NEEDLE ASPIRATION BIOPSIES;  Surgeon: Raechel Chute, MD;  Location: [facility];  Service: Pulmonary;;   CESAREAN SECTION     CHOLECYSTECTOMY     COLONOSCOPY WITH PROPOFOL N/A 03/21/2021   Procedure: COLONOSCOPY WITH PROPOFOL;  Surgeon: Toney Reil, MD;  Location: [facility] ENDOSCOPY;  Service: Gastroenterology;  Laterality: N/A;   OVARY SURGERY     VIDEO BRONCHOSCOPY WITH ENDOBRONCHIAL ULTRASOUND N/A 06/07/2022   Procedure: VIDEO BRONCHOSCOPY WITH ENDOBRONCHIAL ULTRASOUND;  Surgeon: Raechel Chute, MD;  Location: [facility];  Service: Pulmonary;  Laterality: N/A;    FAMILY HISTORY: family history includes Breast cancer (age of onset: 77) in her mother; Cancer in her father and mother.  SOCIAL HISTORY:  reports that she quit smoking about 8 years ago. Her smoking use included cigarettes. She has a 26.66 pack-year smoking history. She has never used smokeless tobacco. She reports current alcohol use. She reports that she does not use drugs.  ALLERGIES: Penicillins  MEDICATIONS:  Current Outpatient Medications  Medication Sig Dispense Refill   aspirin EC 81 MG tablet Take 81 mg by mouth in the morning.     calcium carbonate (OS-CAL) 600 MG TABS Take 600 mg by mouth every evening.     Coenzyme Q10 (CO Q 10 PO) Take 1 capsule by mouth in the morning.     Fluocinolone Acetonide 0.01 % OIL Apply twice daily to ears as needed for rash/itching 20 mL 2   ibuprofen (ADVIL,MOTRIN) 200 MG tablet Take 400 mg by mouth every 8 (eight) hours as needed (pain.).     ketoconazole (NIZORAL)  2 % shampoo 2-3 times per week lather on scalp and ears, leave on 8-10 minutes, rinse well (Patient taking differently: Apply 1 Application topically once a week. Once per week lather on scalp and ears, leave on 8-10 minutes, rinse well) 120 mL 5   MAGNESIUM PO Take 1 tablet by mouth every evening.     OMEGA-3 FATTY ACIDS PO Take 1 g by mouth every evening.     simvastatin (ZOCOR) 40 MG tablet TAKE 1 TABLET  BY MOUTH EVERY DAY 90 tablet 3   VITAMIN D PO Take 1,000 Units by mouth in the morning.     No current facility-administered medications for this encounter.    ECOG PERFORMANCE STATUS:  1 - Symptomatic but completely ambulatory  REVIEW OF SYSTEMS: Patient denies any weight loss, fatigue, weakness, fever, chills or night sweats. Patient denies any loss of vision, blurred vision. Patient denies any ringing  of the ears or hearing loss. No irregular heartbeat. Patient denies heart murmur or history of fainting. Patient denies any chest pain or pain radiating to her upper extremities. Patient denies any shortness of breath, difficulty breathing at night, cough or hemoptysis. Patient denies any swelling in the lower legs. Patient denies any nausea vomiting, vomiting of blood, or coffee ground material in the vomitus. Patient denies any stomach pain. Patient states has had normal bowel movements no significant constipation or diarrhea. Patient denies any dysuria, hematuria or significant nocturia. Patient denies any problems walking, swelling in the joints or loss of balance. Patient denies any skin changes, loss of hair or loss of weight. Patient denies any excessive worrying or anxiety or significant depression. Patient denies any problems with insomnia. Patient denies excessive thirst, polyuria, polydipsia. Patient denies any swollen glands, patient denies easy bruising or easy bleeding. Patient denies any recent infections, allergies or URI. Patient "s visual fields have not changed significantly in recent time.   PHYSICAL EXAM: There were no vitals taken for this visit. Well-developed well-nourished patient in NAD. HEENT reveals PERLA, EOMI, discs not visualized.  Oral cavity is clear. No oral mucosal lesions are identified. Neck is clear without evidence of cervical or supraclavicular adenopathy. Lungs are clear to A&P. Cardiac examination is essentially unremarkable with regular rate and rhythm without  murmur rub or thrill. Abdomen is benign with no organomegaly or masses noted. Motor sensory and DTR levels are equal and symmetric in the upper and lower extremities. Cranial nerves II through XII are grossly intact. Proprioception is intact. No peripheral adenopathy or edema is identified. No motor or sensory levels are noted. Crude visual fields are within normal range.  LABORATORY DATA: Cytology report reviewed    RADIOLOGY RESULTS: PET CT scan MRI scans of brain and CT scans reviewed compatible with above-stated findings    IMPRESSION: Stage IIIb adenocarcinoma of the right lung in 67 year old female  PLAN: At this time I cannot Compass all areas of hypermetabolic activity including her level 3 cervical node within my radiation portal.  I would recommend concurrent chemoradiation therapy.  Would plan on delivering 17 Gray over 33 fractions using IMRT treatment planning and delivery.  I would choose IMRT to spare critical structures such as the heart normal lung spinal cord and esophagus.  Risks and benefits of treatment such as production of cough possible radiation esophagitis causing dysphagia fatigue alteration of blood counts skin reaction all were reviewed in detail with the patient.  There will be extra effort by both professional staff as well as technical staff to coordinate  and manage concurrent chemoradiation and ensuing side effects during her treatments.  Patient comprehends my recommendations well.  I have personally set up and ordered CT simulation for next week.  I would like to take this opportunity to thank you for allowing me to participate in the care of your patient.[name], MD

## 2022-06-19 NOTE — Progress Notes (Signed)
[facility]  Telephone:([phone]) [phone] Fax:(336) [phone]  ID: Connie West OB: 1955/04/25  MR#: [id number]  AOZ#:[id number]  Patient Care Team: Excell Seltzer, MD as PCP - General Glory Buff, RN as Oncology Nurse Navigator  CHIEF COMPLAINT: Stage IIIb adenocarcinoma of the lung.  INTERVAL HISTORY: Patient returns to clinic today for further evaluation, discussion of her MRI results, and treatment planning.  She continues to feel well and remains asymptomatic. She has no neurologic complaints.  She denies any recent fevers or illnesses.  She has a good appetite and denies weight loss.  She has no chest pain, shortness of breath, cough, or hemoptysis.  She denies any nausea, vomiting, constipation, or diarrhea.  She has no urinary complaints.  Patient offers no specific complaints today.  REVIEW OF SYSTEMS:   Review of Systems  Constitutional: Negative.  Negative for fever, malaise/fatigue and weight loss.  Respiratory: Negative.  Negative for cough, hemoptysis and shortness of breath.   Cardiovascular: Negative.  Negative for chest pain and leg swelling.  Gastrointestinal: Negative.  Negative for abdominal pain.  Genitourinary: Negative.  Negative for dysuria.  Musculoskeletal: Negative.  Negative for back pain.  Skin: Negative.  Negative for rash.  Neurological: Negative.  Negative for dizziness, focal weakness, weakness and headaches.  Psychiatric/Behavioral:  The patient is nervous/anxious.     As per HPI. Otherwise, a complete review of systems is negative.  PAST MEDICAL HISTORY: Past Medical History:  Diagnosis Date   Complication of anesthesia    Hyperlipidemia    PONV (postoperative nausea and vomiting)    TIA (transient ischemic attack)     PAST SURGICAL HISTORY: Past Surgical History:  Procedure Laterality Date   BREAST CYST ASPIRATION Right 07/20/2012   FNA benign   BREAST CYST ASPIRATION Right    BREAST SURGERY Right 07-20-12   FNA benign    BRONCHIAL NEEDLE ASPIRATION BIOPSY  06/07/2022   Procedure: BRONCHIAL NEEDLE ASPIRATION BIOPSIES;  Surgeon: Raechel Chute, MD;  Location: [facility];  Service: Pulmonary;;   CESAREAN SECTION     CHOLECYSTECTOMY     COLONOSCOPY WITH PROPOFOL N/A 03/21/2021   Procedure: COLONOSCOPY WITH PROPOFOL;  Surgeon: Toney Reil, MD;  Location: [facility];  Service: Gastroenterology;  Laterality: N/A;   OVARY SURGERY     VIDEO BRONCHOSCOPY WITH ENDOBRONCHIAL ULTRASOUND N/A 06/07/2022   Procedure: VIDEO BRONCHOSCOPY WITH ENDOBRONCHIAL ULTRASOUND;  Surgeon: Raechel Chute, MD;  Location: [facility];  Service: Pulmonary;  Laterality: N/A;    FAMILY HISTORY: Family History  Problem Relation Age of Onset   Breast cancer Mother 5   Cancer Mother        breast   Cancer Father        bone cancer    ADVANCED DIRECTIVES (Y/N):  N  HEALTH MAINTENANCE: Social History   Tobacco Use   Smoking status: Former    Packs/day: 0.62    Years: 43.00    Additional pack years: 0.00    Total pack years: 26.66    Types: Cigarettes    Quit date: 2016    Years since quitting: 8.3   Smokeless tobacco: Never   Tobacco comments:    quit x 1 month 11/16  Vaping Use   Vaping Use: Never used  Substance Use Topics   Alcohol use: Yes    Comment: occasional   Drug use: No     Colonoscopy:  PAP:  Bone density:  Lipid panel:  Allergies  Allergen Reactions   Penicillins Hives  Current Outpatient Medications  Medication Sig Dispense Refill   aspirin EC 81 MG tablet Take 81 mg by mouth in the morning.     calcium carbonate (OS-CAL) 600 MG TABS Take 600 mg by mouth every evening.     Coenzyme Q10 (CO Q 10 PO) Take 1 capsule by mouth in the morning.     Fluocinolone Acetonide 0.01 % OIL Apply twice daily to ears as needed for rash/itching 20 mL 2   ibuprofen (ADVIL,MOTRIN) 200 MG tablet Take 400 mg by mouth every 8 (eight) hours as needed (pain.).     ketoconazole (NIZORAL) 2 % shampoo 2-3  times per week lather on scalp and ears, leave on 8-10 minutes, rinse well (Patient taking differently: Apply 1 Application topically once a week. Once per week lather on scalp and ears, leave on 8-10 minutes, rinse well) 120 mL 5   MAGNESIUM PO Take 1 tablet by mouth every evening.     OMEGA-3 FATTY ACIDS PO Take 1 g by mouth every evening.     simvastatin (ZOCOR) 40 MG tablet TAKE 1 TABLET BY MOUTH EVERY DAY 90 tablet 3   VITAMIN D PO Take 1,000 Units by mouth in the morning.     lidocaine-prilocaine (EMLA) cream Apply to affected area once 30 g 3   ondansetron (ZOFRAN) 8 MG tablet Take 1 tablet (8 mg total) by mouth every 8 (eight) hours as needed for nausea or vomiting. Start on the third day after chemotherapy. 60 tablet 1   prochlorperazine (COMPAZINE) 10 MG tablet Take 1 tablet (10 mg total) by mouth every 6 (six) hours as needed for nausea or vomiting. 60 tablet 1   No current facility-administered medications for this visit.    OBJECTIVE: Vitals:   06/19/22 0929  BP: 128/88  Pulse: 95  Resp: 18  SpO2: 95%     Body mass index is 26.63 kg/m.    ECOG FS:0 - Asymptomatic  General: Well-developed, well-nourished, no acute distress. Eyes: Pink conjunctiva, anicteric sclera. HEENT: Normocephalic, moist mucous membranes. Lungs: No audible wheezing or coughing. Heart: Regular rate and rhythm. Abdomen: Soft, nontender, no obvious distention. Musculoskeletal: No edema, cyanosis, or clubbing. Neuro: Alert, answering all questions appropriately. Cranial nerves grossly intact. Skin: No rashes or petechiae noted. Psych: Normal affect.  LAB RESULTS:  Lab Results  Component Value Date   NA 138 01/21/2022   K 4.2 01/21/2022   CL 101 01/21/2022   CO2 27 01/21/2022   GLUCOSE 109 (H) 01/21/2022   BUN 14 01/21/2022   CREATININE 0.97 01/21/2022   CALCIUM 10.0 01/21/2022   PROT 7.4 01/21/2022   ALBUMIN 4.5 01/21/2022   AST 22 01/21/2022   ALT 19 01/21/2022   ALKPHOS 81 01/21/2022    BILITOT 0.5 01/21/2022   GFRNONAA >60 01/04/2018   GFRAA >60 01/04/2018    Lab Results  Component Value Date   WBC 6.6 06/07/2022   NEUTROABS 4.3 02/01/2022   HGB 14.3 06/07/2022   HCT 43.1 06/07/2022   MCV 86.4 06/07/2022   PLT 331 06/07/2022     STUDIES: MR Brain W Wo Contrast  Result Date: 06/19/2022 CLINICAL DATA:  Metastatic disease evaluation.  Lung cancer. EXAM: MRI HEAD WITHOUT AND WITH CONTRAST TECHNIQUE: Multiplanar, multiecho pulse sequences of the brain and surrounding structures were obtained without and with intravenous contrast. CONTRAST:  7mL GADAVIST GADOBUTROL 1 MMOL/ML IV SOLN COMPARISON:  01/04/2018 FINDINGS: Brain: Diffusion imaging does not show any acute or subacute infarction or other cause of restricted  diffusion. The brainstem is normal. There is an old small vessel cerebellar stroke on the left. Cerebral hemispheres show mild chronic small-vessel ischemic changes of the white matter. No cortical or large vessel territory infarction. No evidence of primary or metastatic mass lesion. No hemorrhage, hydrocephalus or extra-axial collection. After contrast administration, no abnormal enhancement occurs. Vascular: Major vessels at the base of the brain show flow. Skull and upper cervical spine: Negative Sinuses/Orbits: Clear/normal Other: None IMPRESSION: 1. No evidence of metastatic disease. 2. Mild chronic small-vessel ischemic change of the cerebral hemispheric white matter. 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 Electronically Signed   By: Paulina Fusi M.D.   On: 06/19/2022 09:18   DG Chest Port 1 View  Result Date: 06/07/2022 CLINICAL DATA:  S/P bronchoscopy with biopsy EXAM: PORTABLE CHEST 1 VIEW COMPARISON:  CT of the chest May 15, 2022. FINDINGS: Right upper lobe masses with adenopathy, similar when comparing across modalities to prior CT chest. No visible pneumothorax on this limited semi erect radiograph. Left lung is clear. IMPRESSION: Right upper lobe  masses with adenopathy, similar when comparing across modalities to prior CT chest. No visible pneumothorax on this limited semi erect radiograph. Electronically Signed   By: Feliberto Harts M.D.   On: 06/07/2022 14:07   DG C-ARM BRONCHOSCOPY  Result Date: 06/07/2022 C-ARM BRONCHOSCOPY: Fluoroscopy was utilized by the requesting physician.  No radiographic interpretation.   CT SUPER D CHEST WO MONARCH PILOT  Result Date: 06/05/2022 CLINICAL DATA:  Pulmonary masses.  * Tracking Code: BO * EXAM: CT CHEST WITHOUT CONTRAST TECHNIQUE: Multidetector CT imaging of the chest was performed using thin slice collimation for electromagnetic bronchoscopy planning purposes, without intravenous contrast. RADIATION DOSE REDUCTION: This exam was performed according to the departmental dose-optimization program which includes automated exposure control, adjustment of the mA and/or kV according to patient size and/or use of iterative reconstruction technique. COMPARISON:  PET 05/22/2022, CT chest 05/15/2022. FINDINGS: Cardiovascular: Atherosclerotic calcification of the aorta and left anterior descending coronary artery. Heart size normal. No pericardial effusion. Mediastinum/Nodes: Enlarged thoracic inlet lymph nodes measure up to 11 mm in the right level III station (2/10). Mediastinal adenopathy measures up to 1.5 cm in the low left paratracheal station. Hilar regions are difficult to evaluate without IV contrast. No axillary adenopathy. Esophagus is grossly unremarkable. Lungs/Pleura: Right upper lobe masses measure 3.4 x 3.9 cm medially and 5.1 x 5.4 cm inferolaterally, as on recent prior exams. Surrounding ground-glass and septal thickening. Additional peribronchial thickening, septal thickening and ground-glass in the right lower lobe, similar. No pleural fluid. Airway is unremarkable. Upper Abdomen: Visualized portions of the liver, adrenal glands, kidneys, spleen, pancreas, stomach and bowel are grossly  unremarkable. Cholecystectomy. No upper abdominal adenopathy. Musculoskeletal: Osteopenia.  Degenerative changes in the spine. IMPRESSION: 1. Right upper lobe masses with adenopathy extending to level III cervical nodes, indicative of stage IV bronchogenic carcinoma. 2. Surrounding ground-glass and septal thickening in the right upper lobe with peribronchial thickening, septal thickening and ground-glass in the right lower lobe, findings worrisome for lymphangitic carcinomatosis. 3. Aortic atherosclerosis (ICD10-I70.0). Left anterior descending coronary artery calcification. Electronically Signed   By: Leanna Battles M.D.   On: 06/05/2022 13:46   NM PET Image Initial (PI) Skull Base To Thigh  Result Date: 05/24/2022 CLINICAL DATA:  Initial treatment strategy for lung nodules. EXAM: NUCLEAR MEDICINE PET SKULL BASE TO THIGH TECHNIQUE: 9.1 mCi F-18 FDG was injected intravenously. Full-ring PET imaging was performed from the skull base to thigh after  the radiotracer. CT data was obtained and used for attenuation correction and anatomic localization. Fasting blood glucose: 98 mg/dl COMPARISON:  CT chest 16/11/9602 FINDINGS: Mediastinal blood pool activity: SUV max 2.6 Liver activity: SUV max NA NECK: Bilateral level III lymph nodes measure up to 12 mm on the right (4/32), SUV max 14.7. Incidental CT findings: None. CHEST: Extensive hypermetabolic thoracic inlet and mediastinal and hilar adenopathy. Index low left paratracheal lymph node measures 1.6 cm (4/48), SUV max 16.2. Hypermetabolic masses in the right upper lobe measure up to 4.9 x 5.0 cm, SUV max 18.3. Incidental CT findings: Atherosclerotic calcification of the aorta. Heart is at the upper limits of normal in size. No pericardial or pleural effusion. ABDOMEN/PELVIS: No abnormal hypermetabolism. Incidental CT findings: Liver is unremarkable. Cholecystectomy. Adrenal glands, kidneys, spleen, pancreas, stomach and bowel are grossly unremarkable. SKELETON: No  abnormal hypermetabolism. Incidental CT findings: Degenerative changes in the spine. IMPRESSION: 1. Primary bronchogenic carcinoma, at least stage T2b N3 M0 or 3 B disease. However, hypermetabolic level III cervical lymph nodes are worrisome for stage IV disease. 2.  Aortic atherosclerosis (ICD10-I70.0). Electronically Signed   By: Leanna Battles M.D.   On: 05/24/2022 10:45    ASSESSMENT: Stage IIIb adenocarcinoma of the lung.  PLAN:    Stage IIIb adenocarcinoma of the lung: Biopsy from bronchoscopy on June 07, 2022 confirming the diagnosis.  PET scan results from May 22, 2022 reviewed independently and reported as above confirming stage of disease.  Bilateral hypermetabolic cervical lymph nodes are suspicious, but will treat patient as a stage IIIb.  MRI of the brain on Jun 19, 2022 did not reveal any metastatic disease.  Patient had consultation with radiation oncology today and will have her simulation on Jun 27, 2022.  Will plan to start weekly carboplatin and Taxol along with her XRT on Jul 03, 2022.  This will be followed by year-long maintenance durvalumab.  Patient has port placement later this week.  Return to clinic on May 22 to initiate cycle 1 of treatment.    I spent a total of 30 minutes reviewing chart data, face-to-face evaluation with the patient, counseling and coordination of care as detailed above.    Patient expressed understanding and was in agreement with this plan. She also understands that She can call clinic at any time with any questions, concerns, or complaints.    Cancer Staging  Adenocarcinoma of right lung [facility]) Staging form: Lung, AJCC 8th Edition - Clinical stage from 06/12/2022: Stage IIIB (cT2b, cN3, cM0) - Signed by Jeralyn Ruths, MD on 06/12/2022 Stage prefix: Initial diagnosis   Jeralyn Ruths, MD   06/19/2022 12:51 PM

## 2022-06-20 ENCOUNTER — Other Ambulatory Visit: Payer: Self-pay

## 2022-06-20 NOTE — Progress Notes (Signed)
Patient for IR Port Placement on Fri 06/21/2022, I called and spoke with the patient on the phone and gave pre-procedure instructions. Pt was made aware to be here at 10a, NPO after MN prior to procedure as well as driver post procedure/recovery/discharge. Pt stated understanding.  Called 06/20/2022

## 2022-06-21 ENCOUNTER — Other Ambulatory Visit: Payer: Self-pay

## 2022-06-21 ENCOUNTER — Encounter: Payer: Self-pay | Admitting: Radiology

## 2022-06-21 ENCOUNTER — Ambulatory Visit
Admission: RE | Admit: 2022-06-21 | Discharge: 2022-06-21 | Disposition: A | Discharge status: Home / Self Care | Payer: Medicare Other | Source: Ambulatory Visit | Attending: Oncology | Admitting: Oncology

## 2022-06-21 DIAGNOSIS — Z8673 Personal history of transient ischemic attack (TIA), and cerebral infarction without residual deficits: Secondary | ICD-10-CM | POA: Diagnosis not present

## 2022-06-21 DIAGNOSIS — Z87891 Personal history of nicotine dependence: Secondary | ICD-10-CM | POA: Diagnosis not present

## 2022-06-21 DIAGNOSIS — E785 Hyperlipidemia, unspecified: Secondary | ICD-10-CM | POA: Insufficient documentation

## 2022-06-21 DIAGNOSIS — C3491 Malignant neoplasm of unspecified part of right bronchus or lung: Secondary | ICD-10-CM | POA: Insufficient documentation

## 2022-06-21 DIAGNOSIS — Z452 Encounter for adjustment and management of vascular access device: Secondary | ICD-10-CM | POA: Diagnosis not present

## 2022-06-21 DIAGNOSIS — R918 Other nonspecific abnormal finding of lung field: Secondary | ICD-10-CM

## 2022-06-21 HISTORY — PX: IR IMAGING GUIDED PORT INSERTION: IMG5740

## 2022-06-21 MED ORDER — MIDAZOLAM HCL 5 MG/5ML IJ SOLN
INTRAMUSCULAR | Status: AC | PRN
  Administered 2022-06-21 (×2): 1 mg via INTRAVENOUS

## 2022-06-21 MED ORDER — LIDOCAINE-EPINEPHRINE 1 %-1:100000 IJ SOLN
12.0000 mL | Freq: Once | INTRAMUSCULAR | Status: AC
  Administered 2022-06-21: 12 mL via INTRADERMAL

## 2022-06-21 MED ORDER — FENTANYL CITRATE (PF) 100 MCG/2ML IJ SOLN
INTRAMUSCULAR | Status: AC | PRN
  Administered 2022-06-21 (×2): 50 ug via INTRAVENOUS

## 2022-06-21 MED ORDER — HEPARIN SOD (PORK) LOCK FLUSH 100 UNIT/ML IV SOLN
500.0000 [IU] | Freq: Once | INTRAVENOUS | Status: AC
  Administered 2022-06-21: 500 [IU] via INTRAVENOUS

## 2022-06-21 MED ORDER — HEPARIN SOD (PORK) LOCK FLUSH 100 UNIT/ML IV SOLN
INTRAVENOUS | Status: AC
  Filled 2022-06-21: qty 5

## 2022-06-21 MED ORDER — LIDOCAINE-EPINEPHRINE 1 %-1:100000 IJ SOLN
INTRAMUSCULAR | Status: AC
  Filled 2022-06-21: qty 1

## 2022-06-21 MED ORDER — SODIUM CHLORIDE 0.9 % IV SOLN: INTRAVENOUS | Status: DC

## 2022-06-21 MED ORDER — MIDAZOLAM HCL 2 MG/2ML IJ SOLN
INTRAMUSCULAR | Status: AC
  Filled 2022-06-21: qty 2

## 2022-06-21 MED ORDER — FENTANYL CITRATE (PF) 100 MCG/2ML IJ SOLN
INTRAMUSCULAR | Status: AC
  Filled 2022-06-21: qty 2

## 2022-06-21 NOTE — Procedures (Signed)
Interventional Radiology Procedure Note  Procedure: Placement of a LEFT IJ approach single lumen PowerPort.  Tip is positioned at the superior cavoatrial junction and catheter is ready for immediate use.  Complications: No immediate Recommendations:  - Ok to shower tomorrow - Do not submerge for 7 days - Routine line care   Signed,  Aquarius Latouche K. Dezarae Mcclaran, MD   

## 2022-06-21 NOTE — Progress Notes (Signed)
Received Versed 2 mg along with Fentanyl 100 mcg IV for procedure. Report given to Weldon Picking Rn at bedside/16.

## 2022-06-21 NOTE — Progress Notes (Signed)
Patient clinically stable post port placement per Dr Archer Asa, tolerated well. Vitals stable post procedure. Received versed 2 mg along with Fentanyl 100 mcg IV for procedure. Report given to Weldon Picking RN post procedure/16

## 2022-06-21 NOTE — Progress Notes (Signed)
Vitals stable post procedure. Received versed 2 mg along with Fentanyl 100 mcg iv for procedure. Report given to Weldon Picking RN post procedure/16.

## 2022-06-24 DIAGNOSIS — Z87891 Personal history of nicotine dependence: Secondary | ICD-10-CM | POA: Diagnosis not present

## 2022-06-24 DIAGNOSIS — C3491 Malignant neoplasm of unspecified part of right bronchus or lung: Secondary | ICD-10-CM | POA: Diagnosis not present

## 2022-06-25 ENCOUNTER — Telehealth: Payer: Self-pay | Admitting: *Deleted

## 2022-06-25 NOTE — Telephone Encounter (Signed)
FMLA received and completed and sent for physician signature

## 2022-06-25 NOTE — Telephone Encounter (Signed)
Form signed and faxed and copy for patient daughter Vernona Rieger made and put at front desk. Call to patient to inform her of this, I had to leave a message on voice mail.

## 2022-06-27 ENCOUNTER — Ambulatory Visit
Admission: RE | Admit: 2022-06-27 | Discharge: 2022-06-27 | Disposition: A | Discharge status: Home / Self Care | Payer: Medicare Other | Source: Ambulatory Visit | Attending: Radiation Oncology | Admitting: Radiation Oncology

## 2022-06-27 ENCOUNTER — Encounter: Payer: Self-pay | Admitting: *Deleted

## 2022-06-27 ENCOUNTER — Other Ambulatory Visit: Payer: Self-pay

## 2022-06-27 ENCOUNTER — Ambulatory Visit: Payer: Medicare Other | Admitting: Student in an Organized Health Care Education/Training Program

## 2022-06-27 DIAGNOSIS — Z510 Encounter for antineoplastic radiation therapy: Secondary | ICD-10-CM | POA: Diagnosis not present

## 2022-06-27 DIAGNOSIS — Z87891 Personal history of nicotine dependence: Secondary | ICD-10-CM | POA: Diagnosis not present

## 2022-06-27 DIAGNOSIS — C3411 Malignant neoplasm of upper lobe, right bronchus or lung: Secondary | ICD-10-CM | POA: Diagnosis not present

## 2022-06-27 DIAGNOSIS — C3491 Malignant neoplasm of unspecified part of right bronchus or lung: Secondary | ICD-10-CM | POA: Diagnosis not present

## 2022-06-27 NOTE — Progress Notes (Signed)
Met with patient after CT simulation today. Reviewed upcoming appts. All questions answered. Pt stated that she was unable to pick up the emla cream due to insurance not covering. Informed pt that will check with pharmacy and initiate PA if required. Will update her once able to get emla approved with insurance. Nothing further needed at this time.   PA required for EMLA and submitted via covermymeds.

## 2022-06-28 ENCOUNTER — Inpatient Hospital Stay: Payer: Medicare Other

## 2022-06-28 DIAGNOSIS — I10 Essential (primary) hypertension: Secondary | ICD-10-CM | POA: Diagnosis not present

## 2022-06-28 DIAGNOSIS — C3411 Malignant neoplasm of upper lobe, right bronchus or lung: Secondary | ICD-10-CM | POA: Diagnosis not present

## 2022-06-28 DIAGNOSIS — I6782 Cerebral ischemia: Secondary | ICD-10-CM | POA: Diagnosis not present

## 2022-06-28 DIAGNOSIS — C3491 Malignant neoplasm of unspecified part of right bronchus or lung: Secondary | ICD-10-CM

## 2022-06-28 DIAGNOSIS — E871 Hypo-osmolality and hyponatremia: Secondary | ICD-10-CM | POA: Diagnosis not present

## 2022-06-28 DIAGNOSIS — Z5111 Encounter for antineoplastic chemotherapy: Secondary | ICD-10-CM | POA: Diagnosis not present

## 2022-06-28 DIAGNOSIS — Z8673 Personal history of transient ischemic attack (TIA), and cerebral infarction without residual deficits: Secondary | ICD-10-CM | POA: Diagnosis not present

## 2022-06-28 LAB — CBC WITH DIFFERENTIAL (CANCER CENTER ONLY)
Abs Immature Granulocytes: 0.01 10*3/uL (ref 0.00–0.07)
Basophils Absolute: 0 10*3/uL (ref 0.0–0.1)
Basophils Relative: 1 %
Eosinophils Absolute: 0.3 10*3/uL (ref 0.0–0.5)
Eosinophils Relative: 5 %
HCT: 40.2 % (ref 36.0–46.0)
Hemoglobin: 12.9 g/dL (ref 12.0–15.0)
Immature Granulocytes: 0 %
Lymphocytes Relative: 22 %
Lymphs Abs: 1.4 10*3/uL (ref 0.7–4.0)
MCH: 28 pg (ref 26.0–34.0)
MCHC: 32.1 g/dL (ref 30.0–36.0)
MCV: 87.2 fL (ref 80.0–100.0)
Monocytes Absolute: 0.5 10*3/uL (ref 0.1–1.0)
Monocytes Relative: 8 %
Neutro Abs: 4.1 10*3/uL (ref 1.7–7.7)
Neutrophils Relative %: 64 %
Platelet Count: 275 10*3/uL (ref 150–400)
RBC: 4.61 MIL/uL (ref 3.87–5.11)
RDW: 13.1 % (ref 11.5–15.5)
WBC Count: 6.5 10*3/uL (ref 4.0–10.5)
nRBC: 0 % (ref 0.0–0.2)

## 2022-06-28 LAB — CMP (CANCER CENTER ONLY)
ALT: 20 U/L (ref 0–44)
AST: 26 U/L (ref 15–41)
Albumin: 3.8 g/dL (ref 3.5–5.0)
Alkaline Phosphatase: 71 U/L (ref 38–126)
Anion gap: 11 (ref 5–15)
BUN: 14 mg/dL (ref 8–23)
CO2: 23 mmol/L (ref 22–32)
Calcium: 8.9 mg/dL (ref 8.9–10.3)
Chloride: 103 mmol/L (ref 98–111)
Creatinine: 0.81 mg/dL (ref 0.44–1.00)
GFR, Estimated: >60 mL/min (ref >60)
Glucose, Bld: 137 mg/dL — ABNORMAL HIGH (ref 70–99)
Potassium: 3.9 mmol/L (ref 3.5–5.1)
Sodium: 137 mmol/L (ref 135–145)
Total Bilirubin: 0.5 mg/dL (ref 0.3–1.2)
Total Protein: 7.2 g/dL (ref 6.5–8.1)

## 2022-07-02 ENCOUNTER — Encounter: Payer: Self-pay | Admitting: Oncology

## 2022-07-02 ENCOUNTER — Other Ambulatory Visit: Payer: Self-pay

## 2022-07-02 DIAGNOSIS — C3491 Malignant neoplasm of unspecified part of right bronchus or lung: Secondary | ICD-10-CM

## 2022-07-02 MED FILL — Dexamethasone Sodium Phosphate Inj 100 MG/10ML: INTRAMUSCULAR | Qty: 1 | Status: AC

## 2022-07-03 ENCOUNTER — Ambulatory Visit: Admission: RE | Admit: 2022-07-03 | Payer: Medicare Other | Source: Ambulatory Visit

## 2022-07-03 ENCOUNTER — Inpatient Hospital Stay (HOSPITAL_BASED_OUTPATIENT_CLINIC_OR_DEPARTMENT_OTHER): Payer: Medicare Other | Admitting: Oncology

## 2022-07-03 ENCOUNTER — Encounter: Payer: Self-pay | Admitting: *Deleted

## 2022-07-03 ENCOUNTER — Inpatient Hospital Stay: Payer: Medicare Other

## 2022-07-03 VITALS — BP 124/80 | HR 103 | Temp 96.7°F | Wt 165.5 lb

## 2022-07-03 VITALS — BP 121/78 | HR 97 | Resp 18

## 2022-07-03 DIAGNOSIS — Z8673 Personal history of transient ischemic attack (TIA), and cerebral infarction without residual deficits: Secondary | ICD-10-CM | POA: Diagnosis not present

## 2022-07-03 DIAGNOSIS — Z510 Encounter for antineoplastic radiation therapy: Secondary | ICD-10-CM | POA: Diagnosis not present

## 2022-07-03 DIAGNOSIS — I6782 Cerebral ischemia: Secondary | ICD-10-CM | POA: Diagnosis not present

## 2022-07-03 DIAGNOSIS — C3491 Malignant neoplasm of unspecified part of right bronchus or lung: Secondary | ICD-10-CM

## 2022-07-03 DIAGNOSIS — C3411 Malignant neoplasm of upper lobe, right bronchus or lung: Secondary | ICD-10-CM | POA: Diagnosis not present

## 2022-07-03 DIAGNOSIS — E871 Hypo-osmolality and hyponatremia: Secondary | ICD-10-CM | POA: Diagnosis not present

## 2022-07-03 DIAGNOSIS — Z5111 Encounter for antineoplastic chemotherapy: Secondary | ICD-10-CM | POA: Diagnosis not present

## 2022-07-03 DIAGNOSIS — I10 Essential (primary) hypertension: Secondary | ICD-10-CM | POA: Diagnosis not present

## 2022-07-03 DIAGNOSIS — Z87891 Personal history of nicotine dependence: Secondary | ICD-10-CM | POA: Diagnosis not present

## 2022-07-03 LAB — CMP (CANCER CENTER ONLY)
ALT: 19 U/L (ref 0–44)
AST: 26 U/L (ref 15–41)
Albumin: 3.7 g/dL (ref 3.5–5.0)
Alkaline Phosphatase: 73 U/L (ref 38–126)
Anion gap: 10 (ref 5–15)
BUN: 19 mg/dL (ref 8–23)
CO2: 23 mmol/L (ref 22–32)
Calcium: 9.1 mg/dL (ref 8.9–10.3)
Chloride: 103 mmol/L (ref 98–111)
Creatinine: 0.85 mg/dL (ref 0.44–1.00)
GFR, Estimated: >60 mL/min (ref >60)
Glucose, Bld: 166 mg/dL — ABNORMAL HIGH (ref 70–99)
Potassium: 3.6 mmol/L (ref 3.5–5.1)
Sodium: 136 mmol/L (ref 135–145)
Total Bilirubin: 0.4 mg/dL (ref 0.3–1.2)
Total Protein: 7.4 g/dL (ref 6.5–8.1)

## 2022-07-03 LAB — CBC WITH DIFFERENTIAL (CANCER CENTER ONLY)
Abs Immature Granulocytes: 0.01 10*3/uL (ref 0.00–0.07)
Basophils Absolute: 0 10*3/uL (ref 0.0–0.1)
Basophils Relative: 1 %
Eosinophils Absolute: 0.3 10*3/uL (ref 0.0–0.5)
Eosinophils Relative: 4 %
HCT: 39.7 % (ref 36.0–46.0)
Hemoglobin: 13.2 g/dL (ref 12.0–15.0)
Immature Granulocytes: 0 %
Lymphocytes Relative: 22 %
Lymphs Abs: 1.5 10*3/uL (ref 0.7–4.0)
MCH: 28.6 pg (ref 26.0–34.0)
MCHC: 33.2 g/dL (ref 30.0–36.0)
MCV: 85.9 fL (ref 80.0–100.0)
Monocytes Absolute: 0.6 10*3/uL (ref 0.1–1.0)
Monocytes Relative: 9 %
Neutro Abs: 4.3 10*3/uL (ref 1.7–7.7)
Neutrophils Relative %: 64 %
Platelet Count: 353 10*3/uL (ref 150–400)
RBC: 4.62 MIL/uL (ref 3.87–5.11)
RDW: 12.9 % (ref 11.5–15.5)
WBC Count: 6.8 10*3/uL (ref 4.0–10.5)
nRBC: 0 % (ref 0.0–0.2)

## 2022-07-03 MED ORDER — DIPHENHYDRAMINE HCL 50 MG/ML IJ SOLN
25.0000 mg | Freq: Once | INTRAMUSCULAR | Status: AC
  Administered 2022-07-03: 25 mg via INTRAVENOUS
  Filled 2022-07-03: qty 1

## 2022-07-03 MED ORDER — FAMOTIDINE IN NACL 20-0.9 MG/50ML-% IV SOLN
20.0000 mg | Freq: Once | INTRAVENOUS | Status: AC
  Administered 2022-07-03: 20 mg via INTRAVENOUS

## 2022-07-03 MED ORDER — SODIUM CHLORIDE 0.9 % IV SOLN
10.0000 mg | Freq: Once | INTRAVENOUS | Status: AC
  Administered 2022-07-03: 10 mg via INTRAVENOUS
  Filled 2022-07-03: qty 10

## 2022-07-03 MED ORDER — FAMOTIDINE 20 MG IN NS 100 ML IVPB
20.0000 mg | Freq: Once | INTRAVENOUS | Status: AC
  Administered 2022-07-03: 20 mg via INTRAVENOUS
  Filled 2022-07-03: qty 20

## 2022-07-03 MED ORDER — HEPARIN SOD (PORK) LOCK FLUSH 100 UNIT/ML IV SOLN
500.0000 [IU] | Freq: Once | INTRAVENOUS | Status: AC | PRN
  Filled 2022-07-03: qty 5

## 2022-07-03 MED ORDER — SODIUM CHLORIDE 0.9 % IV SOLN
45.0000 mg/m2 | Freq: Once | INTRAVENOUS | Status: AC
  Administered 2022-07-03: 84 mg via INTRAVENOUS
  Filled 2022-07-03: qty 14

## 2022-07-03 MED ORDER — PALONOSETRON HCL INJECTION 0.25 MG/5ML
0.2500 mg | Freq: Once | INTRAVENOUS | Status: AC
  Administered 2022-07-03: 0.25 mg via INTRAVENOUS
  Filled 2022-07-03: qty 5

## 2022-07-03 MED ORDER — FAMOTIDINE IN NACL 20-0.9 MG/50ML-% IV SOLN: 20.0000 mg | Freq: Once | INTRAVENOUS | Status: DC

## 2022-07-03 MED ORDER — SODIUM CHLORIDE 0.9 % IV SOLN
180.6000 mg | Freq: Once | INTRAVENOUS | Status: AC
  Administered 2022-07-03: 180 mg via INTRAVENOUS
  Filled 2022-07-03: qty 18

## 2022-07-03 MED ORDER — SODIUM CHLORIDE 0.9 % IV SOLN
Freq: Once | INTRAVENOUS | Status: AC
  Filled 2022-07-03: qty 250

## 2022-07-03 MED ORDER — HEPARIN SOD (PORK) LOCK FLUSH 100 UNIT/ML IV SOLN
INTRAVENOUS | Status: AC
  Filled 2022-07-03: qty 5

## 2022-07-03 NOTE — Patient Instructions (Signed)
[name]  Discharge Instructions: Thank you for choosing [facility] to provide your oncology and hematology care.  If you have a lab appointment with the Cancer Center, please go directly to the Cancer Center and check in at the registration area.  Wear comfortable clothing and clothing appropriate for easy access to any Portacath or PICC line.   We strive to give you quality time with your provider. You may need to reschedule your appointment if you arrive late (15 or more minutes).  Arriving late affects you and other patients whose appointments are after yours.  Also, if you miss three or more appointments without notifying the office, you may be dismissed from the clinic at the provider's discretion.      For prescription refill requests, have your pharmacy contact our office and allow 72 hours for refills to be completed.    Today you received the following chemotherapy and/or immunotherapy agents Taxol and Carboplatin       To help prevent nausea and vomiting after your treatment, we encourage you to take your nausea medication as directed.  BELOW ARE SYMPTOMS THAT SHOULD BE REPORTED IMMEDIATELY: *FEVER GREATER THAN 100.4 F (38 C) OR HIGHER *CHILLS OR SWEATING *NAUSEA AND VOMITING THAT IS NOT CONTROLLED WITH YOUR NAUSEA MEDICATION *UNUSUAL SHORTNESS OF BREATH *UNUSUAL BRUISING OR BLEEDING *URINARY PROBLEMS (pain or burning when urinating, or frequent urination) *BOWEL PROBLEMS (unusual diarrhea, constipation, pain near the anus) TENDERNESS IN MOUTH AND THROAT WITH OR WITHOUT PRESENCE OF ULCERS (sore throat, sores in mouth, or a toothache) UNUSUAL RASH, SWELLING OR PAIN  UNUSUAL VAGINAL DISCHARGE OR ITCHING   Items with * indicate a potential emergency and should be followed up as soon as possible or go to the Emergency Department if any problems should occur.  Please show the CHEMOTHERAPY ALERT CARD or IMMUNOTHERAPY ALERT CARD at  check-in to the Emergency Department and triage nurse.  Should you have questions after your visit or need to cancel or reschedule your appointment, please contact [facility]  [phone] and follow the prompts.  Office hours are 8:00 a.m. to 4:30 p.m. Monday - Friday. Please note that voicemails left after 4:00 p.m. may not be returned until the following business day.  We are closed weekends and major holidays. You have access to a nurse at all times for urgent questions. Please call the main number to the clinic [phone] and follow the prompts.  For any non-urgent questions, you may also contact your provider using MyChart. We now offer e-Visits for anyone 18 and older to request care online for non-urgent symptoms. For details visit [email or web address].   Also download the MyChart app! Go to the app store, search "MyChart", open the app, select [facility], and log in with your MyChart username and password.     Paclitaxel Injection What is this medication? PACLITAXEL (PAK li TAX el) treats some types of cancer. It works by slowing down the growth of cancer cells. This medicine may be used for other purposes; ask your health care provider or pharmacist if you have questions. COMMON BRAND NAME(S): Onxol, Taxol What should I tell my care team before I take this medication? They need to know if you have any of these conditions: Heart disease Liver disease Low white blood cell levels An unusual or allergic reaction to paclitaxel, other medications, foods, dyes, or preservatives If you or your partner are pregnant or trying to get pregnant Breast-feeding   How should I use this medication? This medication is injected into a vein. It is given by your care team in a hospital or clinic setting. Talk to your care team about the use of this medication in children. While it may be given to children for selected conditions, precautions do  apply. Overdosage: If you think you have taken too much of this medicine contact a poison control center or emergency room at once. NOTE: This medicine is only for you. Do not share this medicine with others. What if I miss a dose? Keep appointments for follow-up doses. It is important not to miss your dose. Call your care team if you are unable to keep an appointment. What may interact with this medication? Do not take this medication with any of the following: Live virus vaccines Other medications may affect the way this medication works. Talk with your care team about all of the medications you take. They may suggest changes to your treatment plan to lower the risk of side effects and to make sure your medications work as intended. This list may not describe all possible interactions. Give your health care provider a list of all the medicines, herbs, non-prescription drugs, or dietary supplements you use. Also tell them if you smoke, drink alcohol, or use illegal drugs. Some items may interact with your medicine. What should I watch for while using this medication? Your condition will be monitored carefully while you are receiving this medication. You may need blood work while taking this medication. This medication may make you feel generally unwell. This is not uncommon as chemotherapy can affect healthy cells as well as cancer cells. Report any side effects. Continue your course of treatment even though you feel ill unless your care team tells you to stop. This medication can cause serious allergic reactions. To reduce the risk, your care team may give you other medications to take before receiving this one. Be sure to follow the directions from your care team. This medication may increase your risk of getting an infection. Call your care team for advice if you get a fever, chills, sore throat, or other symptoms of a cold or flu. Do not treat yourself. Try to avoid being around people who are  sick. This medication may increase your risk to bruise or bleed. Call your care team if you notice any unusual bleeding. Be careful brushing or flossing your teeth or using a toothpick because you may get an infection or bleed more easily. If you have any dental work done, tell your dentist you are receiving this medication. Talk to your care team if you may be pregnant. Serious birth defects can occur if you take this medication during pregnancy. Talk to your care team before breastfeeding. Changes to your treatment plan may be needed. What side effects may I notice from receiving this medication? Side effects that you should report to your care team as soon as possible: Allergic reactions--skin rash, itching, hives, swelling of the face, lips, tongue, or throat Heart rhythm changes--fast or irregular heartbeat, dizziness, feeling faint or lightheaded, chest pain, trouble breathing Increase in blood pressure Infection--fever, chills, cough, sore throat, wounds that don't heal, pain or trouble when passing urine, general feeling of discomfort or being unwell Low blood pressure--dizziness, feeling faint or lightheaded, blurry vision Low red blood cell level--unusual weakness or fatigue, dizziness, headache, trouble breathing Painful swelling, warmth, or redness of the skin, blisters or sores at the infusion site Pain, tingling, or numbness in the hands   or feet Slow heartbeat--dizziness, feeling faint or lightheaded, confusion, trouble breathing, unusual weakness or fatigue Unusual bruising or bleeding Side effects that usually do not require medical attention (report to your care team if they continue or are bothersome): Diarrhea Hair loss Joint pain Loss of appetite Muscle pain Nausea Vomiting This list may not describe all possible side effects. Call your doctor for medical advice about side effects. You may report side effects to FDA at 1-800-FDA-1088. Where should I keep my  medication? This medication is given in a hospital or clinic. It will not be stored at home. NOTE: This sheet is a summary. It may not cover all possible information. If you have questions about this medicine, talk to your doctor, pharmacist, or health care provider.  2023 Elsevier/Gold Standard (2021-06-14 00:00:00)    Carboplatin Injection What is this medication? CARBOPLATIN (KAR boe pla tin) treats some types of cancer. It works by slowing down the growth of cancer cells. This medicine may be used for other purposes; ask your health care provider or pharmacist if you have questions. COMMON BRAND NAME(S): Paraplatin What should I tell my care team before I take this medication? They need to know if you have any of these conditions: Blood disorders Hearing problems Kidney disease Recent or ongoing radiation therapy An unusual or allergic reaction to carboplatin, cisplatin, other medications, foods, dyes, or preservatives Pregnant or trying to get pregnant Breast-feeding How should I use this medication? This medication is injected into a vein. It is given by your care team in a hospital or clinic setting. Talk to your care team about the use of this medication in children. Special care may be needed. Overdosage: If you think you have taken too much of this medicine contact a poison control center or emergency room at once. NOTE: This medicine is only for you. Do not share this medicine with others. What if I miss a dose? Keep appointments for follow-up doses. It is important not to miss your dose. Call your care team if you are unable to keep an appointment. What may interact with this medication? Medications for seizures Some antibiotics, such as amikacin, gentamicin, neomycin, streptomycin, tobramycin Vaccines This list may not describe all possible interactions. Give your health care provider a list of all the medicines, herbs, non-prescription drugs, or dietary supplements you  use. Also tell them if you smoke, drink alcohol, or use illegal drugs. Some items may interact with your medicine. What should I watch for while using this medication? Your condition will be monitored carefully while you are receiving this medication. You may need blood work while taking this medication. This medication may make you feel generally unwell. This is not uncommon, as chemotherapy can affect healthy cells as well as cancer cells. Report any side effects. Continue your course of treatment even though you feel ill unless your care team tells you to stop. In some cases, you may be given additional medications to help with side effects. Follow all directions for their use. This medication may increase your risk of getting an infection. Call your care team for advice if you get a fever, chills, sore throat, or other symptoms of a cold or flu. Do not treat yourself. Try to avoid being around people who are sick. Avoid taking medications that contain aspirin, acetaminophen, ibuprofen, naproxen, or ketoprofen unless instructed by your care team. These medications may hide a fever. Be careful brushing or flossing your teeth or using a toothpick because you may get an infection   or bleed more easily. If you have any dental work done, tell your dentist you are receiving this medication. Talk to your care team if you wish to become pregnant or think you might be pregnant. This medication can cause serious birth defects. Talk to your care team about effective forms of contraception. Do not breast-feed while taking this medication. What side effects may I notice from receiving this medication? Side effects that you should report to your care team as soon as possible: Allergic reactions--skin rash, itching, hives, swelling of the face, lips, tongue, or throat Infection--fever, chills, cough, sore throat, wounds that don't heal, pain or trouble when passing urine, general feeling of discomfort or being  unwell Low red blood cell level--unusual weakness or fatigue, dizziness, headache, trouble breathing Pain, tingling, or numbness in the hands or feet, muscle weakness, change in vision, confusion or trouble speaking, loss of balance or coordination, trouble walking, seizures Unusual bruising or bleeding Side effects that usually do not require medical attention (report to your care team if they continue or are bothersome): Hair loss Nausea Unusual weakness or fatigue Vomiting This list may not describe all possible side effects. Call your doctor for medical advice about side effects. You may report side effects to FDA at 1-800-FDA-1088. Where should I keep my medication? This medication is given in a hospital or clinic. It will not be stored at home. NOTE: This sheet is a summary. It may not cover all possible information. If you have questions about this medicine, talk to your doctor, pharmacist, or health care provider.  2023 Elsevier/Gold Standard (2007-03-21 00:00:00)   

## 2022-07-03 NOTE — Progress Notes (Signed)
Met with patient during follow up visit with Dr. Orlie Dakin to start chemotherapy. All questions answered during visit. Informed that will be given appts prior to leaving today. Instructed to call with any questions or needs. Pt verbalized understanding. Nothing further needed at this time.

## 2022-07-03 NOTE — Progress Notes (Signed)
Dry  cough

## 2022-07-03 NOTE — Progress Notes (Signed)
Pt reassessed and Maralyn Sago PA notified after 30 minutes on current rate (165ml/hr). Per pt she states she feels fine and back to normal. Hive on right arm gone. Per Maralyn Sago PA to continue Taxol at max rate 264 ml/hr for the remainder of the bag. Pt updated and agrees to plan. All questions answered. Taxol completed at 1312. Pt tolerated the rest of the Taxol infusion well and vitals stable.   Jean Alejos [employer]

## 2022-07-03 NOTE — Progress Notes (Signed)
1212- Went to increase rate and pt noted small bump on right inner forearm.  NO other rash/ redness noted.  Denies CP, SOB or any other symptoms.  Med paused Clent Jacks PA made aware .  1213- Sarah at chairside. Pt assessed.. Verbal order to give Pepcid 20 mg once.  Continue Taxol at the current rate for 30 min and then reassess.  Lenna Sciara RN (assigned RN) aware of situation.

## 2022-07-03 NOTE — Progress Notes (Signed)
[facility]  [phone] [phone]  ID: Connie West OB: May 18, 1955  MR#: [id number]  AOZ#:[id number]  Patient Care Team: Excell Seltzer, MD as PCP - General Glory Buff, RN as Oncology Nurse Navigator  CHIEF COMPLAINT: Stage IIIb adenocarcinoma of the lung.  INTERVAL HISTORY: Patient returns to clinic today for further evaluation and initiation of cycle 1 of weekly carboplatin and Taxol.  She will initiate her daily XRT tomorrow.  She is anxious, but otherwise feels well.  She has no neurologic complaints.  She denies any recent fevers or illnesses.  She has a good appetite and denies weight loss.  She has no chest pain, shortness of breath, cough, or hemoptysis.  She denies any nausea, vomiting, constipation, or diarrhea.  She has no urinary complaints.  Patient offers no further specific complaints today.  REVIEW OF SYSTEMS:   Review of Systems  Constitutional: Negative.  Negative for fever, malaise/fatigue and weight loss.  Respiratory: Negative.  Negative for cough, hemoptysis and shortness of breath.   Cardiovascular: Negative.  Negative for chest pain and leg swelling.  Gastrointestinal: Negative.  Negative for abdominal pain.  Genitourinary: Negative.  Negative for dysuria.  Musculoskeletal: Negative.  Negative for back pain.  Skin: Negative.  Negative for rash.  Neurological: Negative.  Negative for dizziness, focal weakness, weakness and headaches.  Psychiatric/Behavioral:  The patient is nervous/anxious.     As per HPI. Otherwise, a complete review of systems is negative.  PAST MEDICAL HISTORY: Past Medical History:  Diagnosis Date   Complication of anesthesia    Hyperlipidemia    PONV (postoperative nausea and vomiting)    TIA (transient ischemic attack)     PAST SURGICAL HISTORY: Past Surgical History:  Procedure Laterality Date   BREAST CYST ASPIRATION Right 07/20/2012   FNA benign   BREAST CYST ASPIRATION Right     BREAST SURGERY Right 07-20-12   FNA benign   BRONCHIAL NEEDLE ASPIRATION BIOPSY  06/07/2022   Procedure: BRONCHIAL NEEDLE ASPIRATION BIOPSIES;  Surgeon: Raechel Chute, MD;  Location: [facility];  Service: Pulmonary;;   CESAREAN SECTION     CHOLECYSTECTOMY     COLONOSCOPY WITH PROPOFOL N/A 03/21/2021   Procedure: COLONOSCOPY WITH PROPOFOL;  Surgeon: Toney Reil, MD;  Location: [facility] ENDOSCOPY;  Service: Gastroenterology;  Laterality: N/A;   IR IMAGING GUIDED PORT INSERTION  06/21/2022   OVARY SURGERY     VIDEO BRONCHOSCOPY WITH ENDOBRONCHIAL ULTRASOUND N/A 06/07/2022   Procedure: VIDEO BRONCHOSCOPY WITH ENDOBRONCHIAL ULTRASOUND;  Surgeon: Raechel Chute, MD;  Location: [facility] ENDOSCOPY;  Service: Pulmonary;  Laterality: N/A;    FAMILY HISTORY: Family History  Problem Relation Age of Onset   Breast cancer Mother 7   Cancer Mother        breast   Cancer Father        bone cancer    ADVANCED DIRECTIVES (Y/N):  N  HEALTH MAINTENANCE: Social History   Tobacco Use   Smoking status: Former    Packs/day: 0.62    Years: 43.00    Additional pack years: 0.00    Total pack years: 26.66    Types: Cigarettes    Quit date: 2016    Years since quitting: 8.3   Smokeless tobacco: Never   Tobacco comments:    quit x 1 month 11/16  Vaping Use   Vaping Use: Never used  Substance Use Topics   Alcohol use: Yes    Comment: occasional : 1x/week   Drug use: No  Colonoscopy:  PAP:  Bone density:  Lipid panel:  Allergies  Allergen Reactions   Penicillins Hives    Current Outpatient Medications  Medication Sig Dispense Refill   aspirin EC 81 MG tablet Take 81 mg by mouth in the morning.     calcium carbonate (OS-CAL) 600 MG TABS Take 600 mg by mouth every evening.     Coenzyme Q10 (CO Q 10 PO) Take 1 capsule by mouth in the morning.     Fluocinolone Acetonide 0.01 % OIL Apply twice daily to ears as needed for rash/itching 20 mL 2   ibuprofen (ADVIL,MOTRIN) 200 MG tablet Take 400  mg by mouth every 8 (eight) hours as needed (pain.).     ketoconazole (NIZORAL) 2 % shampoo 2-3 times per week lather on scalp and ears, leave on 8-10 minutes, rinse well (Patient taking differently: Apply 1 Application topically once a week. Once per week lather on scalp and ears, leave on 8-10 minutes, rinse well) 120 mL 5   lidocaine-prilocaine (EMLA) cream Apply to affected area once 30 g 3   MAGNESIUM PO Take 1 tablet by mouth every evening.     OMEGA-3 FATTY ACIDS PO Take 1 g by mouth every evening.     ondansetron (ZOFRAN) 8 MG tablet Take 1 tablet (8 mg total) by mouth every 8 (eight) hours as needed for nausea or vomiting. Start on the third day after chemotherapy. 60 tablet 1   prochlorperazine (COMPAZINE) 10 MG tablet Take 1 tablet (10 mg total) by mouth every 6 (six) hours as needed for nausea or vomiting. 60 tablet 1   simvastatin (ZOCOR) 40 MG tablet TAKE 1 TABLET BY MOUTH EVERY DAY 90 tablet 3   VITAMIN D PO Take 1,000 Units by mouth in the morning.     No current facility-administered medications for this visit.    OBJECTIVE: Vitals:   07/03/22 0916  BP: 124/80  Pulse: (!) 103  Temp: (!) 96.7 F (35.9 C)  SpO2: 96%     Body mass index is 26.71 kg/m.    ECOG FS:0 - Asymptomatic  General: Well-developed, well-nourished, no acute distress. Eyes: Pink conjunctiva, anicteric sclera. HEENT: Normocephalic, moist mucous membranes. Lungs: No audible wheezing or coughing. Heart: Regular rate and rhythm. Abdomen: Soft, nontender, no obvious distention. Musculoskeletal: No edema, cyanosis, or clubbing. Neuro: Alert, answering all questions appropriately. Cranial nerves grossly intact. Skin: No rashes or petechiae noted. Psych: Normal affect.  LAB RESULTS:  Lab Results  Component Value Date   NA 136 07/03/2022   K 3.6 07/03/2022   CL 103 07/03/2022   CO2 23 07/03/2022   GLUCOSE 166 (H) 07/03/2022   BUN 19 07/03/2022   CREATININE 0.85 07/03/2022   CALCIUM 9.1  07/03/2022   PROT 7.4 07/03/2022   ALBUMIN 3.7 07/03/2022   AST 26 07/03/2022   ALT 19 07/03/2022   ALKPHOS 73 07/03/2022   BILITOT 0.4 07/03/2022   GFRNONAA >60 07/03/2022   GFRAA >60 01/04/2018    Lab Results  Component Value Date   WBC 6.8 07/03/2022   NEUTROABS 4.3 07/03/2022   HGB 13.2 07/03/2022   HCT 39.7 07/03/2022   MCV 85.9 07/03/2022   PLT 353 07/03/2022     STUDIES: IR IMAGING GUIDED PORT INSERTION  Result Date: 06/21/2022 INDICATION: Multifocal right-sided lung cancer. Patient presents for port catheter placement to establish durable venous access. EXAM: IMPLANTED PORT A CATH PLACEMENT WITH ULTRASOUND AND FLUOROSCOPIC GUIDANCE MEDICATIONS: None ANESTHESIA/SEDATION: Versed 2 mg IV; Fentanyl 100 mcg  IV; Moderate Sedation Time:  18 minutes The patient's vital signs and level of consciousness were continuously monitored during the procedure by the interventional radiology nurse under my direct supervision. FLUOROSCOPY: Radiation exposure index: 1 mGy reference air kerma COMPLICATIONS: None immediate. PROCEDURE: The right neck and chest was prepped with chlorhexidine, and draped in the usual sterile fashion using maximum barrier technique (cap and mask, sterile gown, sterile gloves, large sterile sheet, hand hygiene and cutaneous antiseptic). Local anesthesia was attained by infiltration with 1% lidocaine with epinephrine. Ultrasound demonstrated patency of the left internal jugular vein, and this was documented with an image. Under real-time ultrasound guidance, this vein was accessed with a 21 gauge micropuncture needle and image documentation was performed. A small dermatotomy was made at the access site with an 11 scalpel. A 0.018" wire was advanced into the SVC and the access needle exchanged for a 61F micropuncture vascular sheath. The 0.018" wire was then removed and a 0.035" wire advanced into the IVC. An appropriate location for the subcutaneous reservoir was selected below  the clavicle and an incision was made through the skin and underlying soft tissues. The subcutaneous tissues were then dissected using a combination of blunt and sharp surgical technique and a pocket was formed. A single lumen power injectable portacatheter was then tunneled through the subcutaneous tissues from the pocket to the dermatotomy and the port reservoir placed within the subcutaneous pocket. The venous access site was then serially dilated and a peel away vascular sheath placed over the wire. The wire was removed and the port catheter advanced into position under fluoroscopic guidance. The catheter tip is positioned in the superior cavoatrial junction. This was documented with a spot image. The portacatheter was then tested and found to flush and aspirate well. The port was flushed with saline followed by 100 units/mL heparinized saline. The pocket was then closed in two layers using first subdermal inverted interrupted absorbable sutures followed by a running subcuticular suture. The epidermis was then sealed with Dermabond. The dermatotomy at the venous access site was also closed with Dermabond. IMPRESSION: Successful placement of a left IJ approach Power Port with ultrasound and fluoroscopic guidance. The catheter is ready for use. Electronically Signed   By: Malachy Moan M.D.   On: 06/21/2022 16:13   MR Brain W Wo Contrast  Result Date: 06/19/2022 CLINICAL DATA:  Metastatic disease evaluation.  Lung cancer. EXAM: MRI HEAD WITHOUT AND WITH CONTRAST TECHNIQUE: Multiplanar, multiecho pulse sequences of the brain and surrounding structures were obtained without and with intravenous contrast. CONTRAST:  7mL GADAVIST GADOBUTROL 1 MMOL/ML IV SOLN COMPARISON:  01/04/2018 FINDINGS: Brain: Diffusion imaging does not show any acute or subacute infarction or other cause of restricted diffusion. The brainstem is normal. There is an old small vessel cerebellar stroke on the left. Cerebral hemispheres show  mild chronic small-vessel ischemic changes of the white matter. No cortical or large vessel territory infarction. No evidence of primary or metastatic mass lesion. No hemorrhage, hydrocephalus or extra-axial collection. After contrast administration, no abnormal enhancement occurs. Vascular: Major vessels at the base of the brain show flow. Skull and upper cervical spine: Negative Sinuses/Orbits: Clear/normal Other: None IMPRESSION: 1. No evidence of metastatic disease. 2. Mild chronic small-vessel ischemic change of the cerebral hemispheric white matter. 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 Electronically Signed   By: Paulina Fusi M.D.   On: 06/19/2022 09:18   DG Chest Port 1 View  Result Date: 06/07/2022 CLINICAL DATA:  S/P bronchoscopy  with biopsy EXAM: PORTABLE CHEST 1 VIEW COMPARISON:  CT of the chest May 15, 2022. FINDINGS: Right upper lobe masses with adenopathy, similar when comparing across modalities to prior CT chest. No visible pneumothorax on this limited semi erect radiograph. Left lung is clear. IMPRESSION: Right upper lobe masses with adenopathy, similar when comparing across modalities to prior CT chest. No visible pneumothorax on this limited semi erect radiograph. Electronically Signed   By: Feliberto Harts M.D.   On: 06/07/2022 14:07   DG C-ARM BRONCHOSCOPY  Result Date: 06/07/2022 C-ARM BRONCHOSCOPY: Fluoroscopy was utilized by the requesting physician.  No radiographic interpretation.   CT SUPER D CHEST WO MONARCH PILOT  Result Date: 06/05/2022 CLINICAL DATA:  Pulmonary masses.  * Tracking Code: BO * EXAM: CT CHEST WITHOUT CONTRAST TECHNIQUE: Multidetector CT imaging of the chest was performed using thin slice collimation for electromagnetic bronchoscopy planning purposes, without intravenous contrast. RADIATION DOSE REDUCTION: This exam was performed according to the departmental dose-optimization program which includes automated exposure control, adjustment of the mA  and/or kV according to patient size and/or use of iterative reconstruction technique. COMPARISON:  PET 05/22/2022, CT chest 05/15/2022. FINDINGS: Cardiovascular: Atherosclerotic calcification of the aorta and left anterior descending coronary artery. Heart size normal. No pericardial effusion. Mediastinum/Nodes: Enlarged thoracic inlet lymph nodes measure up to 11 mm in the right level III station (2/10). Mediastinal adenopathy measures up to 1.5 cm in the low left paratracheal station. Hilar regions are difficult to evaluate without IV contrast. No axillary adenopathy. Esophagus is grossly unremarkable. Lungs/Pleura: Right upper lobe masses measure 3.4 x 3.9 cm medially and 5.1 x 5.4 cm inferolaterally, as on recent prior exams. Surrounding ground-glass and septal thickening. Additional peribronchial thickening, septal thickening and ground-glass in the right lower lobe, similar. No pleural fluid. Airway is unremarkable. Upper Abdomen: Visualized portions of the liver, adrenal glands, kidneys, spleen, pancreas, stomach and bowel are grossly unremarkable. Cholecystectomy. No upper abdominal adenopathy. Musculoskeletal: Osteopenia.  Degenerative changes in the spine. IMPRESSION: 1. Right upper lobe masses with adenopathy extending to level III cervical nodes, indicative of stage IV bronchogenic carcinoma. 2. Surrounding ground-glass and septal thickening in the right upper lobe with peribronchial thickening, septal thickening and ground-glass in the right lower lobe, findings worrisome for lymphangitic carcinomatosis. 3. Aortic atherosclerosis (ICD10-I70.0). Left anterior descending coronary artery calcification. Electronically Signed   By: Leanna Battles M.D.   On: 06/05/2022 13:46    ASSESSMENT: Stage IIIb adenocarcinoma of the lung.  PLAN:    Stage IIIb adenocarcinoma of the lung: Biopsy from bronchoscopy on June 07, 2022 confirming the diagnosis.  PET scan results from May 22, 2022 reviewed  independently confirming stage of disease.  The bilateral hypermetabolic cervical lymph nodes are suspicious, but will treat patient as a stage IIIb.  MRI of the brain on Jun 19, 2022 did not reveal any metastatic disease.  Plan to give patient weekly carboplatin and Taxol along with daily XRT.  Once patient completes treatment, this will be followed by year-long durvalumab every 2 weeks.  Patient has had port placement.  Proceed with cycle 1 of weekly carboplatin and Taxol today.  Return to clinic in 1 week for further evaluation and consideration of cycle 2.  I spent a total of 30 minutes reviewing chart data, face-to-face evaluation with the patient, counseling and coordination of care as detailed above.   Patient expressed understanding and was in agreement with this plan. She also understands that She can call clinic at any time with any  questions, concerns, or complaints.    Cancer Staging  Adenocarcinoma of right lung [facility]) Staging form: Lung, AJCC 8th Edition - Clinical stage from 06/12/2022: Stage IIIB (cT2b, cN3, cM0) - Signed by Jeralyn Ruths, MD on 06/12/2022 Stage prefix: Initial diagnosis   Jeralyn Ruths, MD   07/03/2022 9:41 AM

## 2022-07-04 ENCOUNTER — Ambulatory Visit
Admission: RE | Admit: 2022-07-04 | Discharge: 2022-07-04 | Disposition: A | Discharge status: Home / Self Care | Payer: Medicare Other | Source: Ambulatory Visit | Attending: Radiation Oncology | Admitting: Radiation Oncology

## 2022-07-04 ENCOUNTER — Telehealth: Payer: Self-pay

## 2022-07-04 ENCOUNTER — Other Ambulatory Visit: Payer: Self-pay

## 2022-07-04 DIAGNOSIS — Z510 Encounter for antineoplastic radiation therapy: Secondary | ICD-10-CM | POA: Diagnosis not present

## 2022-07-04 DIAGNOSIS — C3411 Malignant neoplasm of upper lobe, right bronchus or lung: Secondary | ICD-10-CM | POA: Diagnosis not present

## 2022-07-04 DIAGNOSIS — C3491 Malignant neoplasm of unspecified part of right bronchus or lung: Secondary | ICD-10-CM | POA: Diagnosis not present

## 2022-07-04 DIAGNOSIS — Z87891 Personal history of nicotine dependence: Secondary | ICD-10-CM | POA: Diagnosis not present

## 2022-07-04 LAB — RAD ONC ARIA SESSION SUMMARY
Course Elapsed Days: 0
Plan Fractions Treated to Date: 1
Plan Prescribed Dose Per Fraction: 2 Gy
Plan Total Fractions Prescribed: 20
Plan Total Prescribed Dose: 40 Gy
Reference Point Dosage Given to Date: 2 Gy
Reference Point Session Dosage Given: 2 Gy
Session Number: 1

## 2022-07-04 NOTE — Telephone Encounter (Signed)
Telephone call to patient for follow up after receiving first infusion.   No answer but left message stating we were calling to check on them.  Encouraged patient to call for any questions or concerns.   

## 2022-07-05 ENCOUNTER — Other Ambulatory Visit: Payer: Self-pay

## 2022-07-05 ENCOUNTER — Ambulatory Visit
Admission: RE | Admit: 2022-07-05 | Discharge: 2022-07-05 | Disposition: A | Discharge status: Home / Self Care | Payer: Medicare Other | Source: Ambulatory Visit | Attending: Radiation Oncology | Admitting: Radiation Oncology

## 2022-07-05 DIAGNOSIS — Z510 Encounter for antineoplastic radiation therapy: Secondary | ICD-10-CM | POA: Diagnosis not present

## 2022-07-05 DIAGNOSIS — Z87891 Personal history of nicotine dependence: Secondary | ICD-10-CM | POA: Diagnosis not present

## 2022-07-05 DIAGNOSIS — C3491 Malignant neoplasm of unspecified part of right bronchus or lung: Secondary | ICD-10-CM | POA: Diagnosis not present

## 2022-07-05 DIAGNOSIS — C3411 Malignant neoplasm of upper lobe, right bronchus or lung: Secondary | ICD-10-CM | POA: Diagnosis not present

## 2022-07-05 LAB — RAD ONC ARIA SESSION SUMMARY
Course Elapsed Days: 1
Plan Fractions Treated to Date: 2
Plan Prescribed Dose Per Fraction: 2 Gy
Plan Total Fractions Prescribed: 20
Plan Total Prescribed Dose: 40 Gy
Reference Point Dosage Given to Date: 4 Gy
Reference Point Session Dosage Given: 2 Gy
Session Number: 2

## 2022-07-09 ENCOUNTER — Other Ambulatory Visit: Payer: Self-pay

## 2022-07-09 ENCOUNTER — Ambulatory Visit
Admission: RE | Admit: 2022-07-09 | Discharge: 2022-07-09 | Disposition: A | Discharge status: Home / Self Care | Payer: Medicare Other | Source: Ambulatory Visit | Attending: Radiation Oncology | Admitting: Radiation Oncology

## 2022-07-09 DIAGNOSIS — Z510 Encounter for antineoplastic radiation therapy: Secondary | ICD-10-CM | POA: Diagnosis not present

## 2022-07-09 DIAGNOSIS — C3411 Malignant neoplasm of upper lobe, right bronchus or lung: Secondary | ICD-10-CM | POA: Diagnosis not present

## 2022-07-09 DIAGNOSIS — Z87891 Personal history of nicotine dependence: Secondary | ICD-10-CM | POA: Diagnosis not present

## 2022-07-09 DIAGNOSIS — C3491 Malignant neoplasm of unspecified part of right bronchus or lung: Secondary | ICD-10-CM | POA: Diagnosis not present

## 2022-07-09 LAB — RAD ONC ARIA SESSION SUMMARY
Course Elapsed Days: 5
Plan Fractions Treated to Date: 3
Plan Prescribed Dose Per Fraction: 2 Gy
Plan Total Fractions Prescribed: 20
Plan Total Prescribed Dose: 40 Gy
Reference Point Dosage Given to Date: 6 Gy
Reference Point Session Dosage Given: 2 Gy
Session Number: 3

## 2022-07-09 MED FILL — Dexamethasone Sodium Phosphate Inj 100 MG/10ML: INTRAMUSCULAR | Qty: 1 | Status: AC

## 2022-07-10 ENCOUNTER — Ambulatory Visit
Admission: RE | Admit: 2022-07-10 | Discharge: 2022-07-10 | Disposition: A | Discharge status: Home / Self Care | Payer: Medicare Other | Source: Ambulatory Visit | Attending: Radiation Oncology | Admitting: Radiation Oncology

## 2022-07-10 ENCOUNTER — Inpatient Hospital Stay: Payer: Medicare Other

## 2022-07-10 ENCOUNTER — Inpatient Hospital Stay (HOSPITAL_BASED_OUTPATIENT_CLINIC_OR_DEPARTMENT_OTHER): Payer: Medicare Other | Admitting: Oncology

## 2022-07-10 ENCOUNTER — Other Ambulatory Visit: Payer: Self-pay

## 2022-07-10 ENCOUNTER — Encounter: Payer: Self-pay | Admitting: Oncology

## 2022-07-10 ENCOUNTER — Encounter: Payer: Self-pay | Admitting: *Deleted

## 2022-07-10 VITALS — HR 98

## 2022-07-10 DIAGNOSIS — Z510 Encounter for antineoplastic radiation therapy: Secondary | ICD-10-CM | POA: Diagnosis not present

## 2022-07-10 DIAGNOSIS — Z87891 Personal history of nicotine dependence: Secondary | ICD-10-CM | POA: Diagnosis not present

## 2022-07-10 DIAGNOSIS — Z8673 Personal history of transient ischemic attack (TIA), and cerebral infarction without residual deficits: Secondary | ICD-10-CM | POA: Diagnosis not present

## 2022-07-10 DIAGNOSIS — C3491 Malignant neoplasm of unspecified part of right bronchus or lung: Secondary | ICD-10-CM

## 2022-07-10 DIAGNOSIS — Z5111 Encounter for antineoplastic chemotherapy: Secondary | ICD-10-CM | POA: Diagnosis not present

## 2022-07-10 DIAGNOSIS — C3411 Malignant neoplasm of upper lobe, right bronchus or lung: Secondary | ICD-10-CM | POA: Diagnosis not present

## 2022-07-10 DIAGNOSIS — E871 Hypo-osmolality and hyponatremia: Secondary | ICD-10-CM | POA: Diagnosis not present

## 2022-07-10 DIAGNOSIS — I10 Essential (primary) hypertension: Secondary | ICD-10-CM | POA: Diagnosis not present

## 2022-07-10 DIAGNOSIS — I6782 Cerebral ischemia: Secondary | ICD-10-CM | POA: Diagnosis not present

## 2022-07-10 LAB — CBC WITH DIFFERENTIAL (CANCER CENTER ONLY)
Abs Immature Granulocytes: 0.04 10*3/uL (ref 0.00–0.07)
Basophils Absolute: 0 10*3/uL (ref 0.0–0.1)
Basophils Relative: 1 %
Eosinophils Absolute: 0.1 10*3/uL (ref 0.0–0.5)
Eosinophils Relative: 3 %
HCT: 37.9 % (ref 36.0–46.0)
Hemoglobin: 12.6 g/dL (ref 12.0–15.0)
Immature Granulocytes: 1 %
Lymphocytes Relative: 22 %
Lymphs Abs: 1.1 10*3/uL (ref 0.7–4.0)
MCH: 28.1 pg (ref 26.0–34.0)
MCHC: 33.2 g/dL (ref 30.0–36.0)
MCV: 84.6 fL (ref 80.0–100.0)
Monocytes Absolute: 0.5 10*3/uL (ref 0.1–1.0)
Monocytes Relative: 10 %
Neutro Abs: 3.4 10*3/uL (ref 1.7–7.7)
Neutrophils Relative %: 63 %
Platelet Count: 375 10*3/uL (ref 150–400)
RBC: 4.48 MIL/uL (ref 3.87–5.11)
RDW: 12.6 % (ref 11.5–15.5)
WBC Count: 5.2 10*3/uL (ref 4.0–10.5)
nRBC: 0 % (ref 0.0–0.2)

## 2022-07-10 LAB — RAD ONC ARIA SESSION SUMMARY
Course Elapsed Days: 6
Plan Fractions Treated to Date: 4
Plan Prescribed Dose Per Fraction: 2 Gy
Plan Total Fractions Prescribed: 20
Plan Total Prescribed Dose: 40 Gy
Reference Point Dosage Given to Date: 8 Gy
Reference Point Session Dosage Given: 2 Gy
Session Number: 4

## 2022-07-10 LAB — CMP (CANCER CENTER ONLY)
ALT: 16 U/L (ref 0–44)
AST: 20 U/L (ref 15–41)
Albumin: 3.5 g/dL (ref 3.5–5.0)
Alkaline Phosphatase: 66 U/L (ref 38–126)
Anion gap: 11 (ref 5–15)
BUN: 13 mg/dL (ref 8–23)
CO2: 24 mmol/L (ref 22–32)
Calcium: 8.9 mg/dL (ref 8.9–10.3)
Chloride: 96 mmol/L — ABNORMAL LOW (ref 98–111)
Creatinine: 0.7 mg/dL (ref 0.44–1.00)
GFR, Estimated: >60 mL/min (ref >60)
Glucose, Bld: 132 mg/dL — ABNORMAL HIGH (ref 70–99)
Potassium: 4 mmol/L (ref 3.5–5.1)
Sodium: 131 mmol/L — ABNORMAL LOW (ref 135–145)
Total Bilirubin: 0.3 mg/dL (ref 0.3–1.2)
Total Protein: 7.1 g/dL (ref 6.5–8.1)

## 2022-07-10 MED ORDER — FAMOTIDINE 20 MG IN NS 100 ML IVPB
20.0000 mg | Freq: Once | INTRAVENOUS | Status: AC
  Administered 2022-07-10: 20 mg via INTRAVENOUS
  Filled 2022-07-10: qty 20

## 2022-07-10 MED ORDER — SODIUM CHLORIDE 0.9 % IV SOLN
Freq: Once | INTRAVENOUS | Status: AC
  Filled 2022-07-10: qty 250

## 2022-07-10 MED ORDER — HEPARIN SOD (PORK) LOCK FLUSH 100 UNIT/ML IV SOLN
500.0000 [IU] | Freq: Once | INTRAVENOUS | Status: AC | PRN
  Administered 2022-07-10: 500 [IU]
  Filled 2022-07-10: qty 5

## 2022-07-10 MED ORDER — SODIUM CHLORIDE 0.9 % IV SOLN
180.6000 mg | Freq: Once | INTRAVENOUS | Status: AC
  Administered 2022-07-10: 180 mg via INTRAVENOUS
  Filled 2022-07-10: qty 18

## 2022-07-10 MED ORDER — SODIUM CHLORIDE 0.9 % IV SOLN
10.0000 mg | Freq: Once | INTRAVENOUS | Status: AC
  Administered 2022-07-10: 10 mg via INTRAVENOUS
  Filled 2022-07-10: qty 10

## 2022-07-10 MED ORDER — PALONOSETRON HCL INJECTION 0.25 MG/5ML
0.2500 mg | Freq: Once | INTRAVENOUS | Status: AC
  Administered 2022-07-10: 0.25 mg via INTRAVENOUS
  Filled 2022-07-10: qty 5

## 2022-07-10 MED ORDER — FAMOTIDINE IN NACL 20-0.9 MG/50ML-% IV SOLN: 20.0000 mg | Freq: Once | INTRAVENOUS | Status: DC

## 2022-07-10 MED ORDER — DIPHENHYDRAMINE HCL 50 MG/ML IJ SOLN
25.0000 mg | Freq: Once | INTRAMUSCULAR | Status: AC
  Administered 2022-07-10: 25 mg via INTRAVENOUS
  Filled 2022-07-10: qty 1

## 2022-07-10 MED ORDER — SODIUM CHLORIDE 0.9 % IV SOLN
45.0000 mg/m2 | Freq: Once | INTRAVENOUS | Status: AC
  Administered 2022-07-10: 84 mg via INTRAVENOUS
  Filled 2022-07-10: qty 14

## 2022-07-10 NOTE — Progress Notes (Signed)
[facility]  Telephone:(336) [phone] Fax:(336) [phone]  ID: Connie West OB: 1955/10/11  MR#: [id number]  AOZ#:[id number]  Patient Care Team: Excell Seltzer, MD as PCP - General Glory Buff, RN as Oncology Nurse Navigator  CHIEF COMPLAINT: Stage IIIb adenocarcinoma of the lung.  INTERVAL HISTORY: Patient returns to clinic today for further evaluation and consideration of cycle 2 of weekly carboplatin and Taxol.  She noted increased fatigue, but otherwise tolerated her first treatment well. She has no neurologic complaints.  She denies any recent fevers or illnesses.  She has a good appetite and denies weight loss.  She has no chest pain, shortness of breath, cough, or hemoptysis.  She denies any nausea, vomiting, constipation, or diarrhea.  She has no urinary complaints.  Patient offers no further specific complaints today.    REVIEW OF SYSTEMS:   Review of Systems  Constitutional:  Positive for malaise/fatigue. Negative for fever and weight loss.  Respiratory: Negative.  Negative for cough, hemoptysis and shortness of breath.   Cardiovascular: Negative.  Negative for chest pain and leg swelling.  Gastrointestinal: Negative.  Negative for abdominal pain.  Genitourinary: Negative.  Negative for dysuria.  Musculoskeletal: Negative.  Negative for back pain.  Skin: Negative.  Negative for rash.  Neurological: Negative.  Negative for dizziness, focal weakness, weakness and headaches.  Psychiatric/Behavioral: Negative.  The patient is not nervous/anxious.     As per HPI. Otherwise, a complete review of systems is negative.  PAST MEDICAL HISTORY: Past Medical History:  Diagnosis Date   Complication of anesthesia    Hyperlipidemia    PONV (postoperative nausea and vomiting)    TIA (transient ischemic attack)     PAST SURGICAL HISTORY: Past Surgical History:  Procedure Laterality Date   BREAST CYST ASPIRATION Right 07/20/2012   FNA benign   BREAST CYST ASPIRATION  Right    BREAST SURGERY Right 07-20-12   FNA benign   BRONCHIAL NEEDLE ASPIRATION BIOPSY  06/07/2022   Procedure: BRONCHIAL NEEDLE ASPIRATION BIOPSIES;  Surgeon: Raechel Chute, MD;  Location: [facility];  Service: Pulmonary;;   CESAREAN SECTION     CHOLECYSTECTOMY     COLONOSCOPY WITH PROPOFOL N/A 03/21/2021   Procedure: COLONOSCOPY WITH PROPOFOL;  Surgeon: Toney Reil, MD;  Location: [facility];  Service: Gastroenterology;  Laterality: N/A;   IR IMAGING GUIDED PORT INSERTION  06/21/2022   OVARY SURGERY     VIDEO BRONCHOSCOPY WITH ENDOBRONCHIAL ULTRASOUND N/A 06/07/2022   Procedure: VIDEO BRONCHOSCOPY WITH ENDOBRONCHIAL ULTRASOUND;  Surgeon: Raechel Chute, MD;  Location: [facility];  Service: Pulmonary;  Laterality: N/A;    FAMILY HISTORY: Family History  Problem Relation Age of Onset   Breast cancer Mother 54   Cancer Mother        breast   Cancer Father        bone cancer    ADVANCED DIRECTIVES (Y/N):  N  HEALTH MAINTENANCE: Social History   Tobacco Use   Smoking status: Former    Packs/day: 0.62    Years: 43.00    Additional pack years: 0.00    Total pack years: 26.66    Types: Cigarettes    Quit date: 2016    Years since quitting: 8.4   Smokeless tobacco: Never   Tobacco comments:    quit x 1 month 11/16  Vaping Use   Vaping Use: Never used  Substance Use Topics   Alcohol use: Yes    Comment: occasional : 1x/week   Drug use: No  Colonoscopy:  PAP:  Bone density:  Lipid panel:  Allergies  Allergen Reactions   Penicillins Hives    Current Outpatient Medications  Medication Sig Dispense Refill   aspirin EC 81 MG tablet Take 81 mg by mouth in the morning.     calcium carbonate (OS-CAL) 600 MG TABS Take 600 mg by mouth every evening.     Coenzyme Q10 (CO Q 10 PO) Take 1 capsule by mouth in the morning.     Fluocinolone Acetonide 0.01 % OIL Apply twice daily to ears as needed for rash/itching 20 mL 2   ibuprofen (ADVIL,MOTRIN) 200 MG  tablet Take 400 mg by mouth every 8 (eight) hours as needed (pain.).     ketoconazole (NIZORAL) 2 % shampoo 2-3 times per week lather on scalp and ears, leave on 8-10 minutes, rinse well (Patient taking differently: Apply 1 Application topically once a week. Once per week lather on scalp and ears, leave on 8-10 minutes, rinse well) 120 mL 5   lidocaine-prilocaine (EMLA) cream Apply to affected area once 30 g 3   MAGNESIUM PO Take 1 tablet by mouth every evening.     OMEGA-3 FATTY ACIDS PO Take 1 g by mouth every evening.     ondansetron (ZOFRAN) 8 MG tablet Take 1 tablet (8 mg total) by mouth every 8 (eight) hours as needed for nausea or vomiting. Start on the third day after chemotherapy. 60 tablet 1   prochlorperazine (COMPAZINE) 10 MG tablet Take 1 tablet (10 mg total) by mouth every 6 (six) hours as needed for nausea or vomiting. 60 tablet 1   simvastatin (ZOCOR) 40 MG tablet TAKE 1 TABLET BY MOUTH EVERY DAY 90 tablet 3   VITAMIN D PO Take 1,000 Units by mouth in the morning.     No current facility-administered medications for this visit.   Facility-Administered Medications Ordered in Other Visits  Medication Dose Route Frequency Provider Last Rate Last Admin   CARBOplatin (PARAPLATIN) 180 mg in sodium chloride 0.9 % 100 mL chemo infusion  180 mg Intravenous Once Jeralyn Ruths, MD       heparin lock flush 100 unit/mL  500 Units Intracatheter Once PRN Jeralyn Ruths, MD       PACLitaxel (TAXOL) 84 mg in sodium chloride 0.9 % 250 mL chemo infusion (</= 80mg /m2)  45 mg/m2 (Treatment Plan Recorded) Intravenous Once Jeralyn Ruths, MD        OBJECTIVE: Vitals:   07/10/22 1106  BP: (!) 124/90  Pulse: (!) 102  Temp: (!) 97.2 F (36.2 C)  SpO2: 100%     Body mass index is 26.68 kg/m.    ECOG FS:0 - Asymptomatic  General: Well-developed, well-nourished, no acute distress. Eyes: Pink conjunctiva, anicteric sclera. HEENT: Normocephalic, moist mucous membranes. Lungs: No  audible wheezing or coughing. Heart: Regular rate and rhythm. Abdomen: Soft, nontender, no obvious distention. Musculoskeletal: No edema, cyanosis, or clubbing. Neuro: Alert, answering all questions appropriately. Cranial nerves grossly intact. Skin: No rashes or petechiae noted. Psych: Normal affect.  LAB RESULTS:  Lab Results  Component Value Date   NA 131 (L) 07/10/2022   K 4.0 07/10/2022   CL 96 (L) 07/10/2022   CO2 24 07/10/2022   GLUCOSE 132 (H) 07/10/2022   BUN 13 07/10/2022   CREATININE 0.70 07/10/2022   CALCIUM 8.9 07/10/2022   PROT 7.1 07/10/2022   ALBUMIN 3.5 07/10/2022   AST 20 07/10/2022   ALT 16 07/10/2022   ALKPHOS 66 07/10/2022  BILITOT 0.3 07/10/2022   GFRNONAA >60 07/10/2022   GFRAA >60 01/04/2018    Lab Results  Component Value Date   WBC 5.2 07/10/2022   NEUTROABS 3.4 07/10/2022   HGB 12.6 07/10/2022   HCT 37.9 07/10/2022   MCV 84.6 07/10/2022   PLT 375 07/10/2022     STUDIES: IR IMAGING GUIDED PORT INSERTION  Result Date: 06/21/2022 INDICATION: Multifocal right-sided lung cancer. Patient presents for port catheter placement to establish durable venous access. EXAM: IMPLANTED PORT A CATH PLACEMENT WITH ULTRASOUND AND FLUOROSCOPIC GUIDANCE MEDICATIONS: None ANESTHESIA/SEDATION: Versed 2 mg IV; Fentanyl 100 mcg IV; Moderate Sedation Time:  18 minutes The patient's vital signs and level of consciousness were continuously monitored during the procedure by the interventional radiology nurse under my direct supervision. FLUOROSCOPY: Radiation exposure index: 1 mGy reference air kerma COMPLICATIONS: None immediate. PROCEDURE: The right neck and chest was prepped with chlorhexidine, and draped in the usual sterile fashion using maximum barrier technique (cap and mask, sterile gown, sterile gloves, large sterile sheet, hand hygiene and cutaneous antiseptic). Local anesthesia was attained by infiltration with 1% lidocaine with epinephrine. Ultrasound  demonstrated patency of the left internal jugular vein, and this was documented with an image. Under real-time ultrasound guidance, this vein was accessed with a 21 gauge micropuncture needle and image documentation was performed. A small dermatotomy was made at the access site with an 11 scalpel. A 0.018" wire was advanced into the SVC and the access needle exchanged for a 29F micropuncture vascular sheath. The 0.018" wire was then removed and a 0.035" wire advanced into the IVC. An appropriate location for the subcutaneous reservoir was selected below the clavicle and an incision was made through the skin and underlying soft tissues. The subcutaneous tissues were then dissected using a combination of blunt and sharp surgical technique and a pocket was formed. A single lumen power injectable portacatheter was then tunneled through the subcutaneous tissues from the pocket to the dermatotomy and the port reservoir placed within the subcutaneous pocket. The venous access site was then serially dilated and a peel away vascular sheath placed over the wire. The wire was removed and the port catheter advanced into position under fluoroscopic guidance. The catheter tip is positioned in the superior cavoatrial junction. This was documented with a spot image. The portacatheter was then tested and found to flush and aspirate well. The port was flushed with saline followed by 100 units/mL heparinized saline. The pocket was then closed in two layers using first subdermal inverted interrupted absorbable sutures followed by a running subcuticular suture. The epidermis was then sealed with Dermabond. The dermatotomy at the venous access site was also closed with Dermabond. IMPRESSION: Successful placement of a left IJ approach Power Port with ultrasound and fluoroscopic guidance. The catheter is ready for use. Electronically Signed   By: Malachy Moan M.D.   On: 06/21/2022 16:13   MR Brain W Wo Contrast  Result Date:  06/19/2022 CLINICAL DATA:  Metastatic disease evaluation.  Lung cancer. EXAM: MRI HEAD WITHOUT AND WITH CONTRAST TECHNIQUE: Multiplanar, multiecho pulse sequences of the brain and surrounding structures were obtained without and with intravenous contrast. CONTRAST:  7mL GADAVIST GADOBUTROL 1 MMOL/ML IV SOLN COMPARISON:  01/04/2018 FINDINGS: Brain: Diffusion imaging does not show any acute or subacute infarction or other cause of restricted diffusion. The brainstem is normal. There is an old small vessel cerebellar stroke on the left. Cerebral hemispheres show mild chronic small-vessel ischemic changes of the white matter. No cortical or  large vessel territory infarction. No evidence of primary or metastatic mass lesion. No hemorrhage, hydrocephalus or extra-axial collection. After contrast administration, no abnormal enhancement occurs. Vascular: Major vessels at the base of the brain show flow. Skull and upper cervical spine: Negative Sinuses/Orbits: Clear/normal Other: None IMPRESSION: 1. No evidence of metastatic disease. 2. Mild chronic small-vessel ischemic change of the cerebral hemispheric white matter. 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 Electronically Signed   By: Paulina Fusi M.D.   On: 06/19/2022 09:18    ASSESSMENT: Stage IIIb adenocarcinoma of the lung.  PLAN:    Stage IIIb adenocarcinoma of the lung: Biopsy from bronchoscopy on June 07, 2022 confirming the diagnosis.  PET scan results from May 22, 2022 reviewed independently confirming stage of disease.  The bilateral hypermetabolic cervical lymph nodes are suspicious, but will treat patient as a stage IIIb.  MRI of the brain on Jun 19, 2022 did not reveal any metastatic disease.  Plan to give patient weekly carboplatin and Taxol along with daily XRT.  Once patient completes treatment, this will be followed by year-long durvalumab every 2 weeks.  Patient has had port placement.  Proceed with cycle 2 of weekly carboplatin and Taxol  today.  Continue daily XRT.  Return to clinic in 1 week for further evaluation and consideration of cycle 3.  Hypertension: Mild.  Continue monitor blood pressure closely.  Hyponatremia: Mild, monitor.  Patient's sodium level is 131 today.   Patient expressed understanding and was in agreement with this plan. She also understands that She can call clinic at any time with any questions, concerns, or complaints.    Cancer Staging  Adenocarcinoma of right lung [facility]) Staging form: Lung, AJCC 8th Edition - Clinical stage from 06/12/2022: Stage IIIB (cT2b, cN3, cM0) - Signed by Jeralyn Ruths, MD on 06/12/2022 Stage prefix: Initial diagnosis   Jeralyn Ruths, MD   07/10/2022 12:32 PM

## 2022-07-10 NOTE — Progress Notes (Signed)
Patient is having some blood in her stool. She is feeling more tired with no energy here lately.

## 2022-07-11 ENCOUNTER — Encounter: Payer: Self-pay | Admitting: *Deleted

## 2022-07-11 ENCOUNTER — Other Ambulatory Visit: Payer: Self-pay | Admitting: *Deleted

## 2022-07-11 ENCOUNTER — Other Ambulatory Visit: Payer: Self-pay

## 2022-07-11 ENCOUNTER — Ambulatory Visit
Admission: RE | Admit: 2022-07-11 | Discharge: 2022-07-11 | Disposition: A | Discharge status: Home / Self Care | Payer: Medicare Other | Source: Ambulatory Visit | Attending: Radiation Oncology | Admitting: Radiation Oncology

## 2022-07-11 DIAGNOSIS — C3411 Malignant neoplasm of upper lobe, right bronchus or lung: Secondary | ICD-10-CM | POA: Diagnosis not present

## 2022-07-11 DIAGNOSIS — Z510 Encounter for antineoplastic radiation therapy: Secondary | ICD-10-CM | POA: Diagnosis not present

## 2022-07-11 DIAGNOSIS — C3491 Malignant neoplasm of unspecified part of right bronchus or lung: Secondary | ICD-10-CM | POA: Diagnosis not present

## 2022-07-11 DIAGNOSIS — Z87891 Personal history of nicotine dependence: Secondary | ICD-10-CM | POA: Diagnosis not present

## 2022-07-11 LAB — RAD ONC ARIA SESSION SUMMARY
Course Elapsed Days: 7
Plan Fractions Treated to Date: 5
Plan Prescribed Dose Per Fraction: 2 Gy
Plan Total Fractions Prescribed: 20
Plan Total Prescribed Dose: 40 Gy
Reference Point Dosage Given to Date: 10 Gy
Reference Point Session Dosage Given: 2 Gy
Session Number: 5

## 2022-07-11 MED ORDER — ALPRAZOLAM 0.25 MG PO TABS: 0.2500 mg | ORAL_TABLET | Freq: Every evening | ORAL | 0 refills | Status: DC | PRN

## 2022-07-12 ENCOUNTER — Other Ambulatory Visit: Payer: Self-pay

## 2022-07-12 ENCOUNTER — Ambulatory Visit
Admission: RE | Admit: 2022-07-12 | Discharge: 2022-07-12 | Disposition: A | Discharge status: Home / Self Care | Payer: Medicare Other | Source: Ambulatory Visit | Attending: Radiation Oncology | Admitting: Radiation Oncology

## 2022-07-12 DIAGNOSIS — Z510 Encounter for antineoplastic radiation therapy: Secondary | ICD-10-CM | POA: Diagnosis not present

## 2022-07-12 DIAGNOSIS — C3411 Malignant neoplasm of upper lobe, right bronchus or lung: Secondary | ICD-10-CM | POA: Diagnosis not present

## 2022-07-12 DIAGNOSIS — C3491 Malignant neoplasm of unspecified part of right bronchus or lung: Secondary | ICD-10-CM | POA: Diagnosis not present

## 2022-07-12 DIAGNOSIS — Z87891 Personal history of nicotine dependence: Secondary | ICD-10-CM | POA: Diagnosis not present

## 2022-07-12 LAB — RAD ONC ARIA SESSION SUMMARY
Course Elapsed Days: 8
Plan Fractions Treated to Date: 6
Plan Prescribed Dose Per Fraction: 2 Gy
Plan Total Fractions Prescribed: 20
Plan Total Prescribed Dose: 40 Gy
Reference Point Dosage Given to Date: 12 Gy
Reference Point Session Dosage Given: 2 Gy
Session Number: 6

## 2022-07-15 ENCOUNTER — Ambulatory Visit
Admission: RE | Admit: 2022-07-15 | Discharge: 2022-07-15 | Disposition: A | Discharge status: Home / Self Care | Payer: Medicare Other | Source: Ambulatory Visit | Attending: Radiation Oncology | Admitting: Radiation Oncology

## 2022-07-15 ENCOUNTER — Other Ambulatory Visit: Payer: Self-pay

## 2022-07-15 DIAGNOSIS — C3411 Malignant neoplasm of upper lobe, right bronchus or lung: Secondary | ICD-10-CM | POA: Insufficient documentation

## 2022-07-15 DIAGNOSIS — Z87891 Personal history of nicotine dependence: Secondary | ICD-10-CM | POA: Diagnosis not present

## 2022-07-15 DIAGNOSIS — Z510 Encounter for antineoplastic radiation therapy: Secondary | ICD-10-CM | POA: Insufficient documentation

## 2022-07-15 DIAGNOSIS — C3491 Malignant neoplasm of unspecified part of right bronchus or lung: Secondary | ICD-10-CM | POA: Diagnosis not present

## 2022-07-15 LAB — RAD ONC ARIA SESSION SUMMARY
Course Elapsed Days: 11
Plan Fractions Treated to Date: 7
Plan Prescribed Dose Per Fraction: 2 Gy
Plan Total Fractions Prescribed: 20
Plan Total Prescribed Dose: 40 Gy
Reference Point Dosage Given to Date: 14 Gy
Reference Point Session Dosage Given: 2 Gy
Session Number: 7

## 2022-07-16 ENCOUNTER — Ambulatory Visit
Admission: RE | Admit: 2022-07-16 | Discharge: 2022-07-16 | Disposition: A | Discharge status: Home / Self Care | Payer: Medicare Other | Source: Ambulatory Visit | Attending: Radiation Oncology | Admitting: Radiation Oncology

## 2022-07-16 ENCOUNTER — Other Ambulatory Visit: Payer: Self-pay

## 2022-07-16 DIAGNOSIS — Z87891 Personal history of nicotine dependence: Secondary | ICD-10-CM | POA: Diagnosis not present

## 2022-07-16 DIAGNOSIS — C3411 Malignant neoplasm of upper lobe, right bronchus or lung: Secondary | ICD-10-CM | POA: Diagnosis not present

## 2022-07-16 DIAGNOSIS — Z510 Encounter for antineoplastic radiation therapy: Secondary | ICD-10-CM | POA: Diagnosis not present

## 2022-07-16 DIAGNOSIS — C3491 Malignant neoplasm of unspecified part of right bronchus or lung: Secondary | ICD-10-CM | POA: Diagnosis not present

## 2022-07-16 LAB — RAD ONC ARIA SESSION SUMMARY
Course Elapsed Days: 12
Plan Fractions Treated to Date: 8
Plan Prescribed Dose Per Fraction: 2 Gy
Plan Total Fractions Prescribed: 20
Plan Total Prescribed Dose: 40 Gy
Reference Point Dosage Given to Date: 16 Gy
Reference Point Session Dosage Given: 2 Gy
Session Number: 8

## 2022-07-16 MED FILL — Dexamethasone Sodium Phosphate Inj 100 MG/10ML: INTRAMUSCULAR | Qty: 1 | Status: AC

## 2022-07-17 ENCOUNTER — Encounter: Payer: Self-pay | Admitting: Oncology

## 2022-07-17 ENCOUNTER — Other Ambulatory Visit: Payer: Self-pay | Admitting: *Deleted

## 2022-07-17 ENCOUNTER — Inpatient Hospital Stay: Payer: Medicare Other | Attending: Oncology

## 2022-07-17 ENCOUNTER — Inpatient Hospital Stay: Payer: Medicare Other

## 2022-07-17 ENCOUNTER — Other Ambulatory Visit: Payer: Self-pay

## 2022-07-17 ENCOUNTER — Ambulatory Visit
Admission: RE | Admit: 2022-07-17 | Discharge: 2022-07-17 | Disposition: A | Discharge status: Home / Self Care | Payer: Medicare Other | Source: Ambulatory Visit | Attending: Radiation Oncology | Admitting: Radiation Oncology

## 2022-07-17 ENCOUNTER — Inpatient Hospital Stay (HOSPITAL_BASED_OUTPATIENT_CLINIC_OR_DEPARTMENT_OTHER): Payer: Medicare Other | Admitting: Oncology

## 2022-07-17 VITALS — BP 110/71 | HR 82 | Resp 18

## 2022-07-17 DIAGNOSIS — C3491 Malignant neoplasm of unspecified part of right bronchus or lung: Secondary | ICD-10-CM

## 2022-07-17 DIAGNOSIS — R1310 Dysphagia, unspecified: Secondary | ICD-10-CM | POA: Insufficient documentation

## 2022-07-17 DIAGNOSIS — C3411 Malignant neoplasm of upper lobe, right bronchus or lung: Secondary | ICD-10-CM | POA: Diagnosis not present

## 2022-07-17 DIAGNOSIS — E871 Hypo-osmolality and hyponatremia: Secondary | ICD-10-CM | POA: Insufficient documentation

## 2022-07-17 DIAGNOSIS — Z79899 Other long term (current) drug therapy: Secondary | ICD-10-CM | POA: Insufficient documentation

## 2022-07-17 DIAGNOSIS — D72819 Decreased white blood cell count, unspecified: Secondary | ICD-10-CM | POA: Diagnosis not present

## 2022-07-17 DIAGNOSIS — Z510 Encounter for antineoplastic radiation therapy: Secondary | ICD-10-CM | POA: Diagnosis not present

## 2022-07-17 DIAGNOSIS — Z5111 Encounter for antineoplastic chemotherapy: Secondary | ICD-10-CM | POA: Insufficient documentation

## 2022-07-17 DIAGNOSIS — G4700 Insomnia, unspecified: Secondary | ICD-10-CM | POA: Diagnosis not present

## 2022-07-17 DIAGNOSIS — Z87891 Personal history of nicotine dependence: Secondary | ICD-10-CM | POA: Diagnosis not present

## 2022-07-17 LAB — RAD ONC ARIA SESSION SUMMARY
Course Elapsed Days: 13
Plan Fractions Treated to Date: 9
Plan Prescribed Dose Per Fraction: 2 Gy
Plan Total Fractions Prescribed: 20
Plan Total Prescribed Dose: 40 Gy
Reference Point Dosage Given to Date: 18 Gy
Reference Point Session Dosage Given: 2 Gy
Session Number: 9

## 2022-07-17 LAB — CBC WITH DIFFERENTIAL (CANCER CENTER ONLY)
Abs Immature Granulocytes: 0.02 10*3/uL (ref 0.00–0.07)
Basophils Absolute: 0 10*3/uL (ref 0.0–0.1)
Basophils Relative: 1 %
Eosinophils Absolute: 0.1 10*3/uL (ref 0.0–0.5)
Eosinophils Relative: 3 %
HCT: 36.5 % (ref 36.0–46.0)
Hemoglobin: 12 g/dL (ref 12.0–15.0)
Immature Granulocytes: 1 %
Lymphocytes Relative: 18 %
Lymphs Abs: 0.7 10*3/uL (ref 0.7–4.0)
MCH: 28.1 pg (ref 26.0–34.0)
MCHC: 32.9 g/dL (ref 30.0–36.0)
MCV: 85.5 fL (ref 80.0–100.0)
Monocytes Absolute: 0.6 10*3/uL (ref 0.1–1.0)
Monocytes Relative: 15 %
Neutro Abs: 2.4 10*3/uL (ref 1.7–7.7)
Neutrophils Relative %: 62 %
Platelet Count: 294 10*3/uL (ref 150–400)
RBC: 4.27 MIL/uL (ref 3.87–5.11)
RDW: 13.2 % (ref 11.5–15.5)
WBC Count: 3.8 10*3/uL — ABNORMAL LOW (ref 4.0–10.5)
nRBC: 0 % (ref 0.0–0.2)

## 2022-07-17 LAB — CMP (CANCER CENTER ONLY)
ALT: 15 U/L (ref 0–44)
AST: 18 U/L (ref 15–41)
Albumin: 3.5 g/dL (ref 3.5–5.0)
Alkaline Phosphatase: 64 U/L (ref 38–126)
Anion gap: 10 (ref 5–15)
BUN: 14 mg/dL (ref 8–23)
CO2: 24 mmol/L (ref 22–32)
Calcium: 9.3 mg/dL (ref 8.9–10.3)
Chloride: 98 mmol/L (ref 98–111)
Creatinine: 0.67 mg/dL (ref 0.44–1.00)
GFR, Estimated: >60 mL/min (ref >60)
Glucose, Bld: 114 mg/dL — ABNORMAL HIGH (ref 70–99)
Potassium: 3.7 mmol/L (ref 3.5–5.1)
Sodium: 132 mmol/L — ABNORMAL LOW (ref 135–145)
Total Bilirubin: 0.5 mg/dL (ref 0.3–1.2)
Total Protein: 7 g/dL (ref 6.5–8.1)

## 2022-07-17 MED ORDER — FAMOTIDINE IN NACL 20-0.9 MG/50ML-% IV SOLN: 20.0000 mg | Freq: Once | INTRAVENOUS | Status: DC

## 2022-07-17 MED ORDER — SODIUM CHLORIDE 0.9 % IV SOLN
10.0000 mg | Freq: Once | INTRAVENOUS | Status: AC
  Administered 2022-07-17: 10 mg via INTRAVENOUS
  Filled 2022-07-17: qty 10

## 2022-07-17 MED ORDER — DIPHENHYDRAMINE HCL 50 MG/ML IJ SOLN
25.0000 mg | Freq: Once | INTRAMUSCULAR | Status: AC
  Administered 2022-07-17: 25 mg via INTRAVENOUS
  Filled 2022-07-17: qty 1

## 2022-07-17 MED ORDER — SUCRALFATE 1 G PO TABS: 1.0000 g | ORAL_TABLET | Freq: Three times a day (TID) | ORAL | 1 refills | Status: DC

## 2022-07-17 MED ORDER — FAMOTIDINE 20 MG IN NS 100 ML IVPB
20.0000 mg | Freq: Once | INTRAVENOUS | Status: AC
  Administered 2022-07-17: 20 mg via INTRAVENOUS
  Filled 2022-07-17: qty 100

## 2022-07-17 MED ORDER — SODIUM CHLORIDE 0.9 % IV SOLN
45.0000 mg/m2 | Freq: Once | INTRAVENOUS | Status: AC
  Administered 2022-07-17: 84 mg via INTRAVENOUS
  Filled 2022-07-17: qty 14

## 2022-07-17 MED ORDER — SODIUM CHLORIDE 0.9 % IV SOLN
Freq: Once | INTRAVENOUS | Status: AC
  Filled 2022-07-17: qty 250

## 2022-07-17 MED ORDER — SODIUM CHLORIDE 0.9 % IV SOLN
180.6000 mg | Freq: Once | INTRAVENOUS | Status: AC
  Administered 2022-07-17: 180 mg via INTRAVENOUS
  Filled 2022-07-17: qty 18

## 2022-07-17 MED ORDER — HEPARIN SOD (PORK) LOCK FLUSH 100 UNIT/ML IV SOLN
500.0000 [IU] | Freq: Once | INTRAVENOUS | Status: DC | PRN
  Filled 2022-07-17: qty 5

## 2022-07-17 MED ORDER — PALONOSETRON HCL INJECTION 0.25 MG/5ML
0.2500 mg | Freq: Once | INTRAVENOUS | Status: AC
  Administered 2022-07-17: 0.25 mg via INTRAVENOUS
  Filled 2022-07-17: qty 5

## 2022-07-17 MED ORDER — SODIUM CHLORIDE 0.9% FLUSH
10.0000 mL | INTRAVENOUS | Status: DC | PRN
  Administered 2022-07-17: 10 mL via INTRAVENOUS
  Filled 2022-07-17: qty 10

## 2022-07-17 NOTE — Progress Notes (Signed)
[facility]  Telephone:(336) [phone] Fax:(336) [phone]  ID: Connie West OB: 06-Jan-1956  MR#: [id number]  NGE#:[id number]  Patient Care Team: Excell Seltzer, MD as PCP - General Glory Buff, RN as Oncology Nurse Navigator  CHIEF COMPLAINT: Stage IIIb adenocarcinoma of the lung.  INTERVAL HISTORY: Patient returns to clinic today for further evaluation and consideration of cycle 3 of weekly carboplatin and Taxol.  She has chronic weakness and fatigue, but otherwise is tolerating her treatments well. She has no neurologic complaints.  She denies any recent fevers or illnesses.  She has a good appetite and denies weight loss.  She has no chest pain, shortness of breath, cough, or hemoptysis.  She denies any nausea, vomiting, constipation, or diarrhea.  She has no urinary complaints.  Patient offers no further specific complaints today.  REVIEW OF SYSTEMS:   Review of Systems  Constitutional:  Positive for malaise/fatigue. Negative for fever and weight loss.  Respiratory: Negative.  Negative for cough, hemoptysis and shortness of breath.   Cardiovascular: Negative.  Negative for chest pain and leg swelling.  Gastrointestinal: Negative.  Negative for abdominal pain.  Genitourinary: Negative.  Negative for dysuria.  Musculoskeletal: Negative.  Negative for back pain.  Skin: Negative.  Negative for rash.  Neurological: Negative.  Negative for dizziness, focal weakness, weakness and headaches.  Psychiatric/Behavioral: Negative.  The patient is not nervous/anxious.     As per HPI. Otherwise, a complete review of systems is negative.  PAST MEDICAL HISTORY: Past Medical History:  Diagnosis Date   Complication of anesthesia    Hyperlipidemia    PONV (postoperative nausea and vomiting)    TIA (transient ischemic attack)     PAST SURGICAL HISTORY: Past Surgical History:  Procedure Laterality Date   BREAST CYST ASPIRATION Right 07/20/2012   FNA benign   BREAST CYST  ASPIRATION Right    BREAST SURGERY Right 07-20-12   FNA benign   BRONCHIAL NEEDLE ASPIRATION BIOPSY  06/07/2022   Procedure: BRONCHIAL NEEDLE ASPIRATION BIOPSIES;  Surgeon: Raechel Chute, MD;  Location: [facility];  Service: Pulmonary;;   CESAREAN SECTION     CHOLECYSTECTOMY     COLONOSCOPY WITH PROPOFOL N/A 03/21/2021   Procedure: COLONOSCOPY WITH PROPOFOL;  Surgeon: Toney Reil, MD;  Location: [facility] END[facility]COPY;  Service: Gastroenterology;  Laterality: N/A;   IR IMAGING GUIDED PORT INSERTION  06/21/2022   OVARY SURGERY     VIDEO BRONCHOSCOPY WITH ENDOBRONCHIAL ULTRASOUND N/A 06/07/2022   Procedure: VIDEO BRONCHOSCOPY WITH ENDOBRONCHIAL ULTRASOUND;  Surgeon: Raechel Chute, MD;  Location: [facility];  Service: Pulmonary;  Laterality: N/A;    FAMILY HISTORY: Family History  Problem Relation Age of Onset   Breast cancer Mother 47   Cancer Mother        breast   Cancer Father        bone cancer    ADVANCED DIRECTIVES (Y/N):  N  HEALTH MAINTENANCE: Social History   Tobacco Use   Smoking status: Former    Packs/day: 0.62    Years: 43.00    Additional pack years: 0.00    Total pack years: 26.66    Types: Cigarettes    Quit date: 2016    Years since quitting: 8.4   Smokeless tobacco: Never   Tobacco comments:    quit x 1 month 11/16  Vaping Use   Vaping Use: Never used  Substance Use Topics   Alcohol use: Yes    Comment: occasional : 1x/week   Drug use: No  Colonoscopy:  PAP:  Bone density:  Lipid panel:  Allergies  Allergen Reactions   Penicillins Hives    Current Outpatient Medications  Medication Sig Dispense Refill   ALPRAZolam (XANAX) 0.25 MG tablet Take 1 tablet (0.25 mg total) by mouth at bedtime as needed for anxiety. 30 tablet 0   aspirin EC 81 MG tablet Take 81 mg by mouth in the morning.     calcium carbonate (OS-CAL) 600 MG TABS Take 600 mg by mouth every evening.     Coenzyme Q10 (CO Q 10 PO) Take 1 capsule by mouth in the morning.      Fluocinolone Acetonide 0.01 % OIL Apply twice daily to ears as needed for rash/itching 20 mL 2   ibuprofen (ADVIL,MOTRIN) 200 MG tablet Take 400 mg by mouth every 8 (eight) hours as needed (pain.).     ketoconazole (NIZORAL) 2 % shampoo 2-3 times per week lather on scalp and ears, leave on 8-10 minutes, rinse well (Patient taking differently: Apply 1 Application topically once a week. Once per week lather on scalp and ears, leave on 8-10 minutes, rinse well) 120 mL 5   lidocaine-prilocaine (EMLA) cream Apply to affected area once 30 g 3   MAGNESIUM PO Take 1 tablet by mouth every evening.     OMEGA-3 FATTY ACIDS PO Take 1 g by mouth every evening.     ondansetron (ZOFRAN) 8 MG tablet Take 1 tablet (8 mg total) by mouth every 8 (eight) hours as needed for nausea or vomiting. Start on the third day after chemotherapy. 60 tablet 1   prochlorperazine (COMPAZINE) 10 MG tablet Take 1 tablet (10 mg total) by mouth every 6 (six) hours as needed for nausea or vomiting. 60 tablet 1   simvastatin (ZOCOR) 40 MG tablet TAKE 1 TABLET BY MOUTH EVERY DAY 90 tablet 3   VITAMIN D PO Take 1,000 Units by mouth in the morning.     sucralfate (CARAFATE) 1 g tablet Take 1 tablet (1 g total) by mouth 3 (three) times daily. Dissolve tablet in 4 tablespoons warm water swish and swallow. (Patient not taking: Reported on 07/17/2022) 90 tablet 1   No current facility-administered medications for this visit.   Facility-Administered Medications Ordered in Other Visits  Medication Dose Route Frequency Provider Last Rate Last Admin   heparin lock flush 100 unit/mL  500 Units Intracatheter Once PRN Jeralyn Ruths, MD        OBJECTIVE: Vitals:   07/17/22 1055  BP: 119/84  Pulse: 93  Resp: 16  Temp: 98.3 F (36.8 C)  SpO2: 96%     Body mass index is 26.79 kg/m.    ECOG FS:0 - Asymptomatic  General: Well-developed, well-nourished, no acute distress. Eyes: Pink conjunctiva, anicteric sclera. HEENT: Normocephalic,  moist mucous membranes. Lungs: No audible wheezing or coughing. Heart: Regular rate and rhythm. Abdomen: Soft, nontender, no obvious distention. Musculoskeletal: No edema, cyanosis, or clubbing. Neuro: Alert, answering all questions appropriately. Cranial nerves grossly intact. Skin: No rashes or petechiae noted. Psych: Normal affect.  LAB RESULTS:  Lab Results  Component Value Date   NA 132 (L) 07/17/2022   K 3.7 07/17/2022   CL 98 07/17/2022   CO2 24 07/17/2022   GLUCOSE 114 (H) 07/17/2022   BUN 14 07/17/2022   CREATININE 0.67 07/17/2022   CALCIUM 9.3 07/17/2022   PROT 7.0 07/17/2022   ALBUMIN 3.5 07/17/2022   AST 18 07/17/2022   ALT 15 07/17/2022   ALKPHOS 64 07/17/2022  BILITOT 0.5 07/17/2022   GFRNONAA >60 07/17/2022   GFRAA >60 01/04/2018    Lab Results  Component Value Date   WBC 3.8 (L) 07/17/2022   NEUTROABS 2.4 07/17/2022   HGB 12.0 07/17/2022   HCT 36.5 07/17/2022   MCV 85.5 07/17/2022   PLT 294 07/17/2022     STUDIES: IR IMAGING GUIDED PORT INSERTION  Result Date: 06/21/2022 INDICATION: Multifocal right-sided lung cancer. Patient presents for port catheter placement to establish durable venous access. EXAM: IMPLANTED PORT A CATH PLACEMENT WITH ULTRASOUND AND FLUOROSCOPIC GUIDANCE MEDICATIONS: None ANESTHESIA/SEDATION: Versed 2 mg IV; Fentanyl 100 mcg IV; Moderate Sedation Time:  18 minutes The patient's vital signs and level of consciousness were continuously monitored during the procedure by the interventional radiology nurse under my direct supervision. FLUOROSCOPY: Radiation exposure index: 1 mGy reference air kerma COMPLICATIONS: None immediate. PROCEDURE: The right neck and chest was prepped with chlorhexidine, and draped in the usual sterile fashion using maximum barrier technique (cap and mask, sterile gown, sterile gloves, large sterile sheet, hand hygiene and cutaneous antiseptic). Local anesthesia was attained by infiltration with 1% lidocaine with  epinephrine. Ultrasound demonstrated patency of the left internal jugular vein, and this was documented with an image. Under real-time ultrasound guidance, this vein was accessed with a 21 gauge micropuncture needle and image documentation was performed. A small dermatotomy was made at the access site with an 11 scalpel. A 0.018" wire was advanced into the SVC and the access needle exchanged for a 82F micropuncture vascular sheath. The 0.018" wire was then removed and a 0.035" wire advanced into the IVC. An appropriate location for the subcutaneous reservoir was selected below the clavicle and an incision was made through the skin and underlying soft tissues. The subcutaneous tissues were then dissected using a combination of blunt and sharp surgical technique and a pocket was formed. A single lumen power injectable portacatheter was then tunneled through the subcutaneous tissues from the pocket to the dermatotomy and the port reservoir placed within the subcutaneous pocket. The venous access site was then serially dilated and a peel away vascular sheath placed over the wire. The wire was removed and the port catheter advanced into position under fluoroscopic guidance. The catheter tip is positioned in the superior cavoatrial junction. This was documented with a spot image. The portacatheter was then tested and found to flush and aspirate well. The port was flushed with saline followed by 100 units/mL heparinized saline. The pocket was then closed in two layers using first subdermal inverted interrupted absorbable sutures followed by a running subcuticular suture. The epidermis was then sealed with Dermabond. The dermatotomy at the venous access site was also closed with Dermabond. IMPRESSION: Successful placement of a left IJ approach Power Port with ultrasound and fluoroscopic guidance. The catheter is ready for use. Electronically Signed   By: Malachy Moan M.D.   On: 06/21/2022 16:13    ASSESSMENT: Stage  IIIb adenocarcinoma of the lung.  PLAN:    Stage IIIb adenocarcinoma of the lung: Biopsy from bronchoscopy on June 07, 2022 confirming the diagnosis.  PET scan results from May 22, 2022 reviewed independently confirming stage of disease.  The bilateral hypermetabolic cervical lymph nodes are suspicious, but will treat patient as a stage IIIb.  MRI of the brain on Jun 19, 2022 did not reveal any metastatic disease.  Plan to give patient weekly carboplatin and Taxol along with daily XRT.  Once patient completes treatment, this will be followed by year-long durvalumab every 2  weeks.  Patient has had port placement.  Proceed with cycle 3 of treatment today.  Continue daily XRT.  Return to clinic in 1 week for further evaluation and consideration of cycle 4.   Hypertension: Patient's blood pressure within normal limits today.  Hyponatremia: Chronic and unchanged.  Patient's sodium level is 132. Leukopenia: Mild, monitor.   Patient expressed understanding and was in agreement with this plan. She also understands that She can call clinic at any time with any questions, concerns, or complaints.    Cancer Staging  Adenocarcinoma of right lung [facility]) Staging form: Lung, AJCC 8th Edition - Clinical stage from 06/12/2022: Stage IIIB (cT2b, cN3, cM0) - Signed by Jeralyn Ruths, MD on 06/12/2022 Stage prefix: Initial diagnosis   Jeralyn Ruths, MD   07/17/2022 3:41 PM

## 2022-07-17 NOTE — Patient Instructions (Signed)
[facility] [place] [facility]  Discharge Instructions: Thank you for choosing [facility] to provide your oncology and hematology care.  If you have a lab appointment with the Cancer Center, please go directly to the Cancer Center and check in at the registration area.  Wear comfortable clothing and clothing appropriate for easy access to any Portacath or PICC line.   We strive to give you quality time with your provider. You may need to reschedule your appointment if you arrive late (15 or more minutes).  Arriving late affects you and other patients whose appointments are after yours.  Also, if you miss three or more appointments without notifying the office, you may be dismissed from the clinic at the provider's discretion.      For prescription refill requests, have your pharmacy contact our office and allow 72 hours for refills to be completed.    Today you received the following chemotherapy and/or immunotherapy agents: Carbo/ Taxol     To help prevent nausea and vomiting after your treatment, we encourage you to take your nausea medication as directed.  BELOW ARE SYMPTOMS THAT SHOULD BE REPORTED IMMEDIATELY: *FEVER GREATER THAN 100.4 F (38 C) OR HIGHER *CHILLS OR SWEATING *NAUSEA AND VOMITING THAT IS NOT CONTROLLED WITH YOUR NAUSEA MEDICATION *UNUSUAL SHORTNESS OF BREATH *UNUSUAL BRUISING OR BLEEDING *URINARY PROBLEMS (pain or burning when urinating, or frequent urination) *BOWEL PROBLEMS (unusual diarrhea, constipation, pain near the anus) TENDERNESS IN MOUTH AND THROAT WITH OR WITHOUT PRESENCE OF ULCERS (sore throat, sores in mouth, or a toothache) UNUSUAL RASH, SWELLING OR PAIN  UNUSUAL VAGINAL DISCHARGE OR ITCHING   Items with * indicate a potential emergency and should be followed up as soon as possible or go to the Emergency Department if any problems should occur.  Please show the CHEMOTHERAPY ALERT CARD or IMMUNOTHERAPY ALERT CARD at check-in  to the Emergency Department and triage nurse.  Should you have questions after your visit or need to cancel or reschedule your appointment, please contact [facility] [place] [facility]  [phone] and follow the prompts.  Office hours are 8:00 a.m. to 4:30 p.m. Monday - Friday. Please note that voicemails left after 4:00 p.m. may not be returned until the following business day.  We are closed weekends and major holidays. You have access to a nurse at all times for urgent questions. Please call the main number to the clinic [phone] and follow the prompts.  For any non-urgent questions, you may also contact your provider using MyChart. We now offer e-Visits for anyone 18 and older to request care online for non-urgent symptoms. For details visit [email or web address].   Also download the MyChart app! Go to the app store, search "MyChart", open the app, select [place], and log in with your MyChart username and password.    

## 2022-07-18 ENCOUNTER — Other Ambulatory Visit: Payer: Self-pay

## 2022-07-18 ENCOUNTER — Ambulatory Visit
Admission: RE | Admit: 2022-07-18 | Discharge: 2022-07-18 | Disposition: A | Discharge status: Home / Self Care | Payer: Medicare Other | Source: Ambulatory Visit | Attending: Radiation Oncology | Admitting: Radiation Oncology

## 2022-07-18 DIAGNOSIS — C3491 Malignant neoplasm of unspecified part of right bronchus or lung: Secondary | ICD-10-CM | POA: Diagnosis not present

## 2022-07-18 DIAGNOSIS — Z510 Encounter for antineoplastic radiation therapy: Secondary | ICD-10-CM | POA: Diagnosis not present

## 2022-07-18 DIAGNOSIS — C3411 Malignant neoplasm of upper lobe, right bronchus or lung: Secondary | ICD-10-CM | POA: Diagnosis not present

## 2022-07-18 DIAGNOSIS — Z87891 Personal history of nicotine dependence: Secondary | ICD-10-CM | POA: Diagnosis not present

## 2022-07-18 LAB — RAD ONC ARIA SESSION SUMMARY
Course Elapsed Days: 14
Plan Fractions Treated to Date: 10
Plan Prescribed Dose Per Fraction: 2 Gy
Plan Total Fractions Prescribed: 20
Plan Total Prescribed Dose: 40 Gy
Reference Point Dosage Given to Date: 20 Gy
Reference Point Session Dosage Given: 2 Gy
Session Number: 10

## 2022-07-19 ENCOUNTER — Other Ambulatory Visit: Payer: Self-pay

## 2022-07-19 ENCOUNTER — Ambulatory Visit
Admission: RE | Admit: 2022-07-19 | Discharge: 2022-07-19 | Disposition: A | Discharge status: Home / Self Care | Payer: Medicare Other | Source: Ambulatory Visit | Attending: Radiation Oncology | Admitting: Radiation Oncology

## 2022-07-19 DIAGNOSIS — C3411 Malignant neoplasm of upper lobe, right bronchus or lung: Secondary | ICD-10-CM | POA: Diagnosis not present

## 2022-07-19 DIAGNOSIS — Z510 Encounter for antineoplastic radiation therapy: Secondary | ICD-10-CM | POA: Diagnosis not present

## 2022-07-19 DIAGNOSIS — Z87891 Personal history of nicotine dependence: Secondary | ICD-10-CM | POA: Diagnosis not present

## 2022-07-19 DIAGNOSIS — C3491 Malignant neoplasm of unspecified part of right bronchus or lung: Secondary | ICD-10-CM | POA: Diagnosis not present

## 2022-07-19 LAB — RAD ONC ARIA SESSION SUMMARY
Course Elapsed Days: 15
Plan Fractions Treated to Date: 11
Plan Prescribed Dose Per Fraction: 2 Gy
Plan Total Fractions Prescribed: 20
Plan Total Prescribed Dose: 40 Gy
Reference Point Dosage Given to Date: 22 Gy
Reference Point Session Dosage Given: 2 Gy
Session Number: 11

## 2022-07-22 ENCOUNTER — Other Ambulatory Visit: Payer: Self-pay

## 2022-07-22 ENCOUNTER — Encounter: Payer: Self-pay | Admitting: *Deleted

## 2022-07-22 ENCOUNTER — Ambulatory Visit
Admission: RE | Admit: 2022-07-22 | Discharge: 2022-07-22 | Disposition: A | Discharge status: Home / Self Care | Payer: Medicare Other | Source: Ambulatory Visit | Attending: Radiation Oncology | Admitting: Radiation Oncology

## 2022-07-22 DIAGNOSIS — C3491 Malignant neoplasm of unspecified part of right bronchus or lung: Secondary | ICD-10-CM | POA: Diagnosis not present

## 2022-07-22 DIAGNOSIS — Z87891 Personal history of nicotine dependence: Secondary | ICD-10-CM | POA: Diagnosis not present

## 2022-07-22 DIAGNOSIS — Z510 Encounter for antineoplastic radiation therapy: Secondary | ICD-10-CM | POA: Diagnosis not present

## 2022-07-22 DIAGNOSIS — C3411 Malignant neoplasm of upper lobe, right bronchus or lung: Secondary | ICD-10-CM | POA: Diagnosis not present

## 2022-07-22 LAB — RAD ONC ARIA SESSION SUMMARY
Course Elapsed Days: 18
Plan Fractions Treated to Date: 12
Plan Prescribed Dose Per Fraction: 2 Gy
Plan Total Fractions Prescribed: 20
Plan Total Prescribed Dose: 40 Gy
Reference Point Dosage Given to Date: 24 Gy
Reference Point Session Dosage Given: 2 Gy
Session Number: 12

## 2022-07-22 MED ORDER — MAGIC MOUTHWASH: 10.0000 mL | Freq: Three times a day (TID) | ORAL | 0 refills | Status: DC | PRN

## 2022-07-23 ENCOUNTER — Other Ambulatory Visit: Payer: Self-pay

## 2022-07-23 ENCOUNTER — Ambulatory Visit
Admission: RE | Admit: 2022-07-23 | Discharge: 2022-07-23 | Disposition: A | Discharge status: Home / Self Care | Payer: Medicare Other | Source: Ambulatory Visit | Attending: Radiation Oncology | Admitting: Radiation Oncology

## 2022-07-23 ENCOUNTER — Encounter: Payer: Self-pay | Admitting: Oncology

## 2022-07-23 DIAGNOSIS — Z510 Encounter for antineoplastic radiation therapy: Secondary | ICD-10-CM | POA: Diagnosis not present

## 2022-07-23 DIAGNOSIS — Z87891 Personal history of nicotine dependence: Secondary | ICD-10-CM | POA: Diagnosis not present

## 2022-07-23 DIAGNOSIS — C3411 Malignant neoplasm of upper lobe, right bronchus or lung: Secondary | ICD-10-CM | POA: Diagnosis not present

## 2022-07-23 DIAGNOSIS — C3491 Malignant neoplasm of unspecified part of right bronchus or lung: Secondary | ICD-10-CM

## 2022-07-23 LAB — RAD ONC ARIA SESSION SUMMARY
Course Elapsed Days: 19
Plan Fractions Treated to Date: 13
Plan Prescribed Dose Per Fraction: 2 Gy
Plan Total Fractions Prescribed: 20
Plan Total Prescribed Dose: 40 Gy
Reference Point Dosage Given to Date: 26 Gy
Reference Point Session Dosage Given: 2 Gy
Session Number: 13

## 2022-07-23 MED ORDER — MAGIC MOUTHWASH
10.0000 mL | Freq: Three times a day (TID) | ORAL | 0 refills | Status: DC | PRN
Start: 2022-07-23 — End: 2022-09-09

## 2022-07-23 MED FILL — Dexamethasone Sodium Phosphate Inj 100 MG/10ML: INTRAMUSCULAR | Qty: 1 | Status: AC

## 2022-07-24 ENCOUNTER — Encounter: Payer: Self-pay | Admitting: Oncology

## 2022-07-24 ENCOUNTER — Other Ambulatory Visit: Payer: Self-pay

## 2022-07-24 ENCOUNTER — Ambulatory Visit
Admission: RE | Admit: 2022-07-24 | Discharge: 2022-07-24 | Disposition: A | Discharge status: Home / Self Care | Payer: Medicare Other | Source: Ambulatory Visit | Attending: Radiation Oncology | Admitting: Radiation Oncology

## 2022-07-24 ENCOUNTER — Inpatient Hospital Stay: Payer: Medicare Other

## 2022-07-24 ENCOUNTER — Inpatient Hospital Stay (HOSPITAL_BASED_OUTPATIENT_CLINIC_OR_DEPARTMENT_OTHER): Payer: Medicare Other | Admitting: Oncology

## 2022-07-24 VITALS — HR 98

## 2022-07-24 DIAGNOSIS — G4700 Insomnia, unspecified: Secondary | ICD-10-CM | POA: Diagnosis not present

## 2022-07-24 DIAGNOSIS — E871 Hypo-osmolality and hyponatremia: Secondary | ICD-10-CM | POA: Diagnosis not present

## 2022-07-24 DIAGNOSIS — Z87891 Personal history of nicotine dependence: Secondary | ICD-10-CM | POA: Diagnosis not present

## 2022-07-24 DIAGNOSIS — C3491 Malignant neoplasm of unspecified part of right bronchus or lung: Secondary | ICD-10-CM | POA: Diagnosis not present

## 2022-07-24 DIAGNOSIS — Z79899 Other long term (current) drug therapy: Secondary | ICD-10-CM | POA: Diagnosis not present

## 2022-07-24 DIAGNOSIS — Z5111 Encounter for antineoplastic chemotherapy: Secondary | ICD-10-CM | POA: Diagnosis not present

## 2022-07-24 DIAGNOSIS — Z510 Encounter for antineoplastic radiation therapy: Secondary | ICD-10-CM | POA: Diagnosis not present

## 2022-07-24 DIAGNOSIS — C3411 Malignant neoplasm of upper lobe, right bronchus or lung: Secondary | ICD-10-CM | POA: Diagnosis not present

## 2022-07-24 DIAGNOSIS — D72819 Decreased white blood cell count, unspecified: Secondary | ICD-10-CM | POA: Diagnosis not present

## 2022-07-24 DIAGNOSIS — R1310 Dysphagia, unspecified: Secondary | ICD-10-CM | POA: Diagnosis not present

## 2022-07-24 LAB — CBC WITH DIFFERENTIAL (CANCER CENTER ONLY)
Abs Immature Granulocytes: 0.02 10*3/uL (ref 0.00–0.07)
Basophils Absolute: 0 10*3/uL (ref 0.0–0.1)
Basophils Relative: 1 %
Eosinophils Absolute: 0 10*3/uL (ref 0.0–0.5)
Eosinophils Relative: 1 %
HCT: 36.4 % (ref 36.0–46.0)
Hemoglobin: 11.9 g/dL — ABNORMAL LOW (ref 12.0–15.0)
Immature Granulocytes: 1 %
Lymphocytes Relative: 8 %
Lymphs Abs: 0.3 10*3/uL — ABNORMAL LOW (ref 0.7–4.0)
MCH: 28.1 pg (ref 26.0–34.0)
MCHC: 32.7 g/dL (ref 30.0–36.0)
MCV: 85.8 fL (ref 80.0–100.0)
Monocytes Absolute: 0.4 10*3/uL (ref 0.1–1.0)
Monocytes Relative: 12 %
Neutro Abs: 2.8 10*3/uL (ref 1.7–7.7)
Neutrophils Relative %: 77 %
Platelet Count: 247 10*3/uL (ref 150–400)
RBC: 4.24 MIL/uL (ref 3.87–5.11)
RDW: 13.5 % (ref 11.5–15.5)
WBC Count: 3.6 10*3/uL — ABNORMAL LOW (ref 4.0–10.5)
nRBC: 0 % (ref 0.0–0.2)

## 2022-07-24 LAB — RAD ONC ARIA SESSION SUMMARY
Course Elapsed Days: 20
Plan Fractions Treated to Date: 14
Plan Prescribed Dose Per Fraction: 2 Gy
Plan Total Fractions Prescribed: 20
Plan Total Prescribed Dose: 40 Gy
Reference Point Dosage Given to Date: 28 Gy
Reference Point Session Dosage Given: 2 Gy
Session Number: 14

## 2022-07-24 LAB — CMP (CANCER CENTER ONLY)
ALT: 17 U/L (ref 0–44)
AST: 23 U/L (ref 15–41)
Albumin: 3.5 g/dL (ref 3.5–5.0)
Alkaline Phosphatase: 58 U/L (ref 38–126)
Anion gap: 9 (ref 5–15)
BUN: 14 mg/dL (ref 8–23)
CO2: 24 mmol/L (ref 22–32)
Calcium: 8 mg/dL — ABNORMAL LOW (ref 8.9–10.3)
Chloride: 103 mmol/L (ref 98–111)
Creatinine: 0.63 mg/dL (ref 0.44–1.00)
GFR, Estimated: >60 mL/min (ref >60)
Glucose, Bld: 110 mg/dL — ABNORMAL HIGH (ref 70–99)
Potassium: 3.9 mmol/L (ref 3.5–5.1)
Sodium: 136 mmol/L (ref 135–145)
Total Bilirubin: 0.3 mg/dL (ref 0.3–1.2)
Total Protein: 7 g/dL (ref 6.5–8.1)

## 2022-07-24 MED ORDER — PALONOSETRON HCL INJECTION 0.25 MG/5ML
0.2500 mg | Freq: Once | INTRAVENOUS | Status: AC
  Administered 2022-07-24: 0.25 mg via INTRAVENOUS
  Filled 2022-07-24: qty 5

## 2022-07-24 MED ORDER — FAMOTIDINE IN NACL 20-0.9 MG/50ML-% IV SOLN
20.0000 mg | Freq: Once | INTRAVENOUS | Status: AC
  Administered 2022-07-24: 20 mg via INTRAVENOUS
  Filled 2022-07-24: qty 50

## 2022-07-24 MED ORDER — SODIUM CHLORIDE 0.9 % IV SOLN
45.0000 mg/m2 | Freq: Once | INTRAVENOUS | Status: AC
  Administered 2022-07-24: 84 mg via INTRAVENOUS
  Filled 2022-07-24: qty 14

## 2022-07-24 MED ORDER — SODIUM CHLORIDE 0.9 % IV SOLN
180.6000 mg | Freq: Once | INTRAVENOUS | Status: AC
  Administered 2022-07-24: 180 mg via INTRAVENOUS
  Filled 2022-07-24: qty 18

## 2022-07-24 MED ORDER — SODIUM CHLORIDE 0.9 % IV SOLN
10.0000 mg | Freq: Once | INTRAVENOUS | Status: AC
  Administered 2022-07-24: 10 mg via INTRAVENOUS
  Filled 2022-07-24: qty 10

## 2022-07-24 MED ORDER — HEPARIN SOD (PORK) LOCK FLUSH 100 UNIT/ML IV SOLN
500.0000 [IU] | Freq: Once | INTRAVENOUS | Status: AC | PRN
  Administered 2022-07-24: 500 [IU]
  Filled 2022-07-24: qty 5

## 2022-07-24 MED ORDER — SODIUM CHLORIDE 0.9 % IV SOLN
Freq: Once | INTRAVENOUS | Status: AC
  Filled 2022-07-24: qty 250

## 2022-07-24 MED ORDER — DIPHENHYDRAMINE HCL 50 MG/ML IJ SOLN
25.0000 mg | Freq: Once | INTRAMUSCULAR | Status: AC
  Administered 2022-07-24: 25 mg via INTRAVENOUS
  Filled 2022-07-24: qty 1

## 2022-07-24 NOTE — Progress Notes (Signed)
[facility]  [phone] [phone]  ID: Connie West OB: 14-Apr-1955  MR#: [id number]  AOZ#:[id number]  Patient Care Team: Excell Seltzer, MD as PCP - General Glory Buff, RN as Oncology Nurse Navigator  CHIEF COMPLAINT: Stage IIIb adenocarcinoma of the lung.  INTERVAL HISTORY: Patient returns to clinic today for further evaluation and consideration of cycle 4 of weekly carboplatin and Taxol along with her daily XRT.  She continues to have weakness and fatigue, but otherwise feels well.  She has a mild dysphagia.  She has no neurologic complaints.  She denies any recent fevers or illnesses.  She has a good appetite and denies weight loss.  She has no chest pain, shortness of breath, cough, or hemoptysis.  She denies any nausea, vomiting, constipation, or diarrhea.  She has no urinary complaints.  Patient offers no further specific complaints today.  REVIEW OF SYSTEMS:   Review of Systems  Constitutional:  Positive for malaise/fatigue. Negative for fever and weight loss.  Respiratory: Negative.  Negative for cough, hemoptysis and shortness of breath.   Cardiovascular: Negative.  Negative for chest pain and leg swelling.  Gastrointestinal: Negative.  Negative for abdominal pain.  Genitourinary: Negative.  Negative for dysuria.  Musculoskeletal: Negative.  Negative for back pain.  Skin: Negative.  Negative for rash.  Neurological:  Positive for weakness. Negative for dizziness, focal weakness and headaches.  Psychiatric/Behavioral: Negative.  The patient is not nervous/anxious.     As per HPI. Otherwise, a complete review of systems is negative.  PAST MEDICAL HISTORY: Past Medical History:  Diagnosis Date   Complication of anesthesia    Hyperlipidemia    PONV (postoperative nausea and vomiting)    TIA (transient ischemic attack)     PAST SURGICAL HISTORY: Past Surgical History:  Procedure Laterality Date   BREAST CYST ASPIRATION Right  07/20/2012   FNA benign   BREAST CYST ASPIRATION Right    BREAST SURGERY Right 07-20-12   FNA benign   BRONCHIAL NEEDLE ASPIRATION BIOPSY  06/07/2022   Procedure: BRONCHIAL NEEDLE ASPIRATION BIOPSIES;  Surgeon: Raechel Chute, MD;  [facility]: [facility];  Service: Pulmonary;;   CESAREAN SECTION     CHOLECYSTECTOMY     COLONOSCOPY WITH PROPOFOL N/A 03/21/2021   Procedure: COLONOSCOPY WITH PROPOFOL;  Surgeon: Toney Reil, MD;  Location: [facility];  Service: Gastroenterology;  Laterality: N/A;   IR IMAGING GUIDED PORT INSERTION  06/21/2022   OVARY SURGERY     VIDEO BRONCHOSCOPY WITH ENDOBRONCHIAL ULTRASOUND N/A 06/07/2022   Procedure: VIDEO BRONCHOSCOPY WITH ENDOBRONCHIAL ULTRASOUND;  Surgeon: Raechel Chute, MD;  Location: [facility];  Service: Pulmonary;  Laterality: N/A;    FAMILY HISTORY: Family History  Problem Relation Age of Onset   Breast cancer Mother 89   Cancer Mother        breast   Cancer Father        bone cancer    ADVANCED DIRECTIVES (Y/N):  N  HEALTH MAINTENANCE: Social History   Tobacco Use   Smoking status: Former    Packs/day: 0.62    Years: 43.00    Additional pack years: 0.00    Total pack years: 26.66    Types: Cigarettes    Quit date: 2016    Years since quitting: 8.4   Smokeless tobacco: Never   Tobacco comments:    quit x 1 month 11/16  Vaping Use   Vaping Use: Never used  Substance Use Topics   Alcohol use: Yes  Comment: occasional : 1x/week   Drug use: No     Colonoscopy:  PAP:  Bone density:  Lipid panel:  Allergies  Allergen Reactions   Penicillins Hives    Current Outpatient Medications  Medication Sig Dispense Refill   ALPRAZolam (XANAX) 0.25 MG tablet Take 1 tablet (0.25 mg total) by mouth at bedtime as needed for anxiety. 30 tablet 0   aspirin EC 81 MG tablet Take 81 mg by mouth in the morning.     calcium carbonate (OS-CAL) 600 MG TABS Take 600 mg by mouth every evening.     Coenzyme Q10 (CO Q 10 PO) Take 1  capsule by mouth in the morning.     Fluocinolone Acetonide 0.01 % OIL Apply twice daily to ears as needed for rash/itching 20 mL 2   ibuprofen (ADVIL,MOTRIN) 200 MG tablet Take 400 mg by mouth every 8 (eight) hours as needed (pain.).     ketoconazole (NIZORAL) 2 % shampoo 2-3 times per week lather on scalp and ears, leave on 8-10 minutes, rinse well (Patient taking differently: Apply 1 Application topically once a week. Once per week lather on scalp and ears, leave on 8-10 minutes, rinse well) 120 mL 5   lidocaine-prilocaine (EMLA) cream Apply to affected area once 30 g 3   magic mouthwash SOLN Take 10 mLs by mouth 3 (three) times daily as needed. 300 mL 0   MAGNESIUM PO Take 1 tablet by mouth every evening.     OMEGA-3 FATTY ACIDS PO Take 1 g by mouth every evening.     ondansetron (ZOFRAN) 8 MG tablet Take 1 tablet (8 mg total) by mouth every 8 (eight) hours as needed for nausea or vomiting. Start on the third day after chemotherapy. 60 tablet 1   prochlorperazine (COMPAZINE) 10 MG tablet Take 1 tablet (10 mg total) by mouth every 6 (six) hours as needed for nausea or vomiting. 60 tablet 1   simvastatin (ZOCOR) 40 MG tablet TAKE 1 TABLET BY MOUTH EVERY DAY 90 tablet 3   VITAMIN D PO Take 1,000 Units by mouth in the morning.     sucralfate (CARAFATE) 1 g tablet Take 1 tablet (1 g total) by mouth 3 (three) times daily. Dissolve tablet in 4 tablespoons warm water swish and swallow. (Patient not taking: Reported on 07/17/2022) 90 tablet 1   No current facility-administered medications for this visit.   Facility-Administered Medications Ordered in Other Visits  Medication Dose Route Frequency Provider Last Rate Last Admin   CARBOplatin (PARAPLATIN) 180 mg in sodium chloride 0.9 % 100 mL chemo infusion  180 mg Intravenous Once Jeralyn Ruths, MD       heparin lock flush 100 unit/mL  500 Units Intracatheter Once PRN Jeralyn Ruths, MD       PACLitaxel (TAXOL) 84 mg in sodium chloride 0.9 %  250 mL chemo infusion (</= 80mg /m2)  45 mg/m2 (Treatment Plan Recorded) Intravenous Once Jeralyn Ruths, MD 264 mL/hr at 07/24/22 1324 84 mg at 07/24/22 1324    OBJECTIVE: Vitals:   07/24/22 1114  BP: 118/81  Pulse: (!) 103  Temp: 97.6 F (36.4 C)  SpO2: 100%     Body mass index is 26.31 kg/m.    ECOG FS:0 - Asymptomatic  General: Well-developed, well-nourished, no acute distress. Eyes: Pink conjunctiva, anicteric sclera. HEENT: Normocephalic, moist mucous membranes. Lungs: No audible wheezing or coughing. Heart: Regular rate and rhythm. Abdomen: Soft, nontender, no obvious distention. Musculoskeletal: No edema, cyanosis, or clubbing. Neuro:  Alert, answering all questions appropriately. Cranial nerves grossly intact. Skin: No rashes or petechiae noted. Psych: Normal affect.  LAB RESULTS:  Lab Results  Component Value Date   NA 136 07/24/2022   K 3.9 07/24/2022   CL 103 07/24/2022   CO2 24 07/24/2022   GLUCOSE 110 (H) 07/24/2022   BUN 14 07/24/2022   CREATININE 0.63 07/24/2022   CALCIUM 8.0 (L) 07/24/2022   PROT 7.0 07/24/2022   ALBUMIN 3.5 07/24/2022   AST 23 07/24/2022   ALT 17 07/24/2022   ALKPHOS 58 07/24/2022   BILITOT 0.3 07/24/2022   GFRNONAA >60 07/24/2022   GFRAA >60 01/04/2018    Lab Results  Component Value Date   WBC 3.6 (L) 07/24/2022   NEUTROABS 2.8 07/24/2022   HGB 11.9 (L) 07/24/2022   HCT 36.4 07/24/2022   MCV 85.8 07/24/2022   PLT 247 07/24/2022     STUDIES: No results found.  ASSESSMENT: Stage IIIb adenocarcinoma of the lung.  PLAN:    Stage IIIb adenocarcinoma of the lung: Biopsy from bronchoscopy on June 07, 2022 confirming the diagnosis.  PET scan results from May 22, 2022 reviewed independently confirming stage of disease.  The bilateral hypermetabolic cervical lymph nodes are suspicious, but will treat patient as a stage IIIb.  MRI of the brain on Jun 19, 2022 did not reveal any metastatic disease.  Plan to give patient  weekly carboplatin and Taxol along with daily XRT.  Once patient completes treatment, this will be followed by year-long durvalumab every 2 weeks.  Patient has had port placement.  Proceed with cycle 4 of treatment today.  Continue daily XRT.  Return to clinic in 1 week for further evaluation and consideration of cycle 5. Dysphagia: Continue Magic mouthwash as prescribed. Insomnia: Continue Xanax as needed at night. Hyponatremia: Resolved. Leukopenia: Chronic and unchanged, monitor.   Patient expressed understanding and was in agreement with this plan. She also understands that She can call clinic at any time with any questions, concerns, or complaints.    Cancer Staging  Adenocarcinoma of right lung [facility]) Staging form: Lung, AJCC 8th Edition - Clinical stage from 06/12/2022: Stage IIIB (cT2b, cN3, cM0) - Signed by Jeralyn Ruths, MD on 06/12/2022 Stage prefix: Initial diagnosis   Jeralyn Ruths, MD   07/24/2022 2:16 PM

## 2022-07-24 NOTE — Progress Notes (Signed)
Patient says that taking the xanax she takes at night has really helped her sleep at night and with her anxiety when coming in for her treatments. She also said that this morning when they took blood from her port they had some trouble, she had to lie down and maneuver her arm.

## 2022-07-25 ENCOUNTER — Other Ambulatory Visit: Payer: Self-pay

## 2022-07-25 ENCOUNTER — Ambulatory Visit
Admission: RE | Admit: 2022-07-25 | Discharge: 2022-07-25 | Disposition: A | Discharge status: Home / Self Care | Payer: Medicare Other | Source: Ambulatory Visit | Attending: Radiation Oncology | Admitting: Radiation Oncology

## 2022-07-25 DIAGNOSIS — Z510 Encounter for antineoplastic radiation therapy: Secondary | ICD-10-CM | POA: Diagnosis not present

## 2022-07-25 DIAGNOSIS — C3491 Malignant neoplasm of unspecified part of right bronchus or lung: Secondary | ICD-10-CM | POA: Diagnosis not present

## 2022-07-25 DIAGNOSIS — Z87891 Personal history of nicotine dependence: Secondary | ICD-10-CM | POA: Diagnosis not present

## 2022-07-25 DIAGNOSIS — C3411 Malignant neoplasm of upper lobe, right bronchus or lung: Secondary | ICD-10-CM | POA: Diagnosis not present

## 2022-07-25 LAB — RAD ONC ARIA SESSION SUMMARY
Course Elapsed Days: 21
Plan Fractions Treated to Date: 15
Plan Prescribed Dose Per Fraction: 2 Gy
Plan Total Fractions Prescribed: 20
Plan Total Prescribed Dose: 40 Gy
Reference Point Dosage Given to Date: 30 Gy
Reference Point Session Dosage Given: 2 Gy
Session Number: 15

## 2022-07-26 ENCOUNTER — Other Ambulatory Visit: Payer: Self-pay

## 2022-07-26 ENCOUNTER — Ambulatory Visit
Admission: RE | Admit: 2022-07-26 | Discharge: 2022-07-26 | Disposition: A | Discharge status: Home / Self Care | Payer: Medicare Other | Source: Ambulatory Visit | Attending: Radiation Oncology | Admitting: Radiation Oncology

## 2022-07-26 DIAGNOSIS — C3411 Malignant neoplasm of upper lobe, right bronchus or lung: Secondary | ICD-10-CM | POA: Diagnosis not present

## 2022-07-26 DIAGNOSIS — Z87891 Personal history of nicotine dependence: Secondary | ICD-10-CM | POA: Diagnosis not present

## 2022-07-26 DIAGNOSIS — C3491 Malignant neoplasm of unspecified part of right bronchus or lung: Secondary | ICD-10-CM | POA: Diagnosis not present

## 2022-07-26 DIAGNOSIS — Z510 Encounter for antineoplastic radiation therapy: Secondary | ICD-10-CM | POA: Diagnosis not present

## 2022-07-26 LAB — RAD ONC ARIA SESSION SUMMARY
Course Elapsed Days: 22
Plan Fractions Treated to Date: 16
Plan Prescribed Dose Per Fraction: 2 Gy
Plan Total Fractions Prescribed: 20
Plan Total Prescribed Dose: 40 Gy
Reference Point Dosage Given to Date: 32 Gy
Reference Point Session Dosage Given: 2 Gy
Session Number: 16

## 2022-07-29 ENCOUNTER — Ambulatory Visit
Admission: RE | Admit: 2022-07-29 | Discharge: 2022-07-29 | Disposition: A | Discharge status: Home / Self Care | Payer: Medicare Other | Source: Ambulatory Visit | Attending: Radiation Oncology | Admitting: Radiation Oncology

## 2022-07-29 ENCOUNTER — Other Ambulatory Visit: Payer: Self-pay

## 2022-07-29 DIAGNOSIS — Z87891 Personal history of nicotine dependence: Secondary | ICD-10-CM | POA: Diagnosis not present

## 2022-07-29 DIAGNOSIS — C3411 Malignant neoplasm of upper lobe, right bronchus or lung: Secondary | ICD-10-CM | POA: Diagnosis not present

## 2022-07-29 DIAGNOSIS — C3491 Malignant neoplasm of unspecified part of right bronchus or lung: Secondary | ICD-10-CM | POA: Diagnosis not present

## 2022-07-29 DIAGNOSIS — Z510 Encounter for antineoplastic radiation therapy: Secondary | ICD-10-CM | POA: Diagnosis not present

## 2022-07-29 LAB — RAD ONC ARIA SESSION SUMMARY
Course Elapsed Days: 25
Plan Fractions Treated to Date: 17
Plan Prescribed Dose Per Fraction: 2 Gy
Plan Total Fractions Prescribed: 20
Plan Total Prescribed Dose: 40 Gy
Reference Point Dosage Given to Date: 34 Gy
Reference Point Session Dosage Given: 2 Gy
Session Number: 17

## 2022-07-30 ENCOUNTER — Ambulatory Visit
Admission: RE | Admit: 2022-07-30 | Discharge: 2022-07-30 | Disposition: A | Discharge status: Home / Self Care | Payer: Medicare Other | Source: Ambulatory Visit | Attending: Radiation Oncology | Admitting: Radiation Oncology

## 2022-07-30 ENCOUNTER — Other Ambulatory Visit: Payer: Self-pay

## 2022-07-30 DIAGNOSIS — C3491 Malignant neoplasm of unspecified part of right bronchus or lung: Secondary | ICD-10-CM | POA: Diagnosis not present

## 2022-07-30 DIAGNOSIS — Z510 Encounter for antineoplastic radiation therapy: Secondary | ICD-10-CM | POA: Diagnosis not present

## 2022-07-30 DIAGNOSIS — Z87891 Personal history of nicotine dependence: Secondary | ICD-10-CM | POA: Diagnosis not present

## 2022-07-30 DIAGNOSIS — C3411 Malignant neoplasm of upper lobe, right bronchus or lung: Secondary | ICD-10-CM | POA: Diagnosis not present

## 2022-07-30 LAB — RAD ONC ARIA SESSION SUMMARY
Course Elapsed Days: 26
Plan Fractions Treated to Date: 18
Plan Prescribed Dose Per Fraction: 2 Gy
Plan Total Fractions Prescribed: 20
Plan Total Prescribed Dose: 40 Gy
Reference Point Dosage Given to Date: 36 Gy
Reference Point Session Dosage Given: 2 Gy
Session Number: 18

## 2022-07-30 MED FILL — Dexamethasone Sodium Phosphate Inj 100 MG/10ML: INTRAMUSCULAR | Qty: 1 | Status: AC

## 2022-07-31 ENCOUNTER — Inpatient Hospital Stay: Payer: Medicare Other

## 2022-07-31 ENCOUNTER — Encounter: Payer: Self-pay | Admitting: Oncology

## 2022-07-31 ENCOUNTER — Other Ambulatory Visit: Payer: Self-pay

## 2022-07-31 ENCOUNTER — Ambulatory Visit
Admission: RE | Admit: 2022-07-31 | Discharge: 2022-07-31 | Disposition: A | Discharge status: Home / Self Care | Payer: Medicare Other | Source: Ambulatory Visit | Attending: Radiation Oncology | Admitting: Radiation Oncology

## 2022-07-31 ENCOUNTER — Inpatient Hospital Stay (HOSPITAL_BASED_OUTPATIENT_CLINIC_OR_DEPARTMENT_OTHER): Payer: Medicare Other | Admitting: Oncology

## 2022-07-31 VITALS — BP 127/90 | HR 97

## 2022-07-31 VITALS — BP 126/84 | HR 106 | Temp 97.8°F | Resp 16 | Ht 66.0 in | Wt 162.5 lb

## 2022-07-31 DIAGNOSIS — Z510 Encounter for antineoplastic radiation therapy: Secondary | ICD-10-CM | POA: Diagnosis not present

## 2022-07-31 DIAGNOSIS — C3491 Malignant neoplasm of unspecified part of right bronchus or lung: Secondary | ICD-10-CM | POA: Diagnosis not present

## 2022-07-31 DIAGNOSIS — Z87891 Personal history of nicotine dependence: Secondary | ICD-10-CM | POA: Diagnosis not present

## 2022-07-31 DIAGNOSIS — Z79899 Other long term (current) drug therapy: Secondary | ICD-10-CM | POA: Diagnosis not present

## 2022-07-31 DIAGNOSIS — C3411 Malignant neoplasm of upper lobe, right bronchus or lung: Secondary | ICD-10-CM | POA: Diagnosis not present

## 2022-07-31 DIAGNOSIS — G4700 Insomnia, unspecified: Secondary | ICD-10-CM | POA: Diagnosis not present

## 2022-07-31 DIAGNOSIS — R1310 Dysphagia, unspecified: Secondary | ICD-10-CM | POA: Diagnosis not present

## 2022-07-31 DIAGNOSIS — E871 Hypo-osmolality and hyponatremia: Secondary | ICD-10-CM | POA: Diagnosis not present

## 2022-07-31 DIAGNOSIS — D72819 Decreased white blood cell count, unspecified: Secondary | ICD-10-CM | POA: Diagnosis not present

## 2022-07-31 DIAGNOSIS — Z5111 Encounter for antineoplastic chemotherapy: Secondary | ICD-10-CM | POA: Diagnosis not present

## 2022-07-31 LAB — RAD ONC ARIA SESSION SUMMARY
Course Elapsed Days: 27
Plan Fractions Treated to Date: 19
Plan Prescribed Dose Per Fraction: 2 Gy
Plan Total Fractions Prescribed: 20
Plan Total Prescribed Dose: 40 Gy
Reference Point Dosage Given to Date: 38 Gy
Reference Point Session Dosage Given: 2 Gy
Session Number: 19

## 2022-07-31 LAB — CMP (CANCER CENTER ONLY)
ALT: 26 U/L (ref 0–44)
AST: 23 U/L (ref 15–41)
Albumin: 3.7 g/dL (ref 3.5–5.0)
Alkaline Phosphatase: 58 U/L (ref 38–126)
Anion gap: 11 (ref 5–15)
BUN: 16 mg/dL (ref 8–23)
CO2: 25 mmol/L (ref 22–32)
Calcium: 9.6 mg/dL (ref 8.9–10.3)
Chloride: 99 mmol/L (ref 98–111)
Creatinine: 0.65 mg/dL (ref 0.44–1.00)
GFR, Estimated: >60 mL/min (ref >60)
Glucose, Bld: 118 mg/dL — ABNORMAL HIGH (ref 70–99)
Potassium: 4.1 mmol/L (ref 3.5–5.1)
Sodium: 135 mmol/L (ref 135–145)
Total Bilirubin: 0.6 mg/dL (ref 0.3–1.2)
Total Protein: 7.2 g/dL (ref 6.5–8.1)

## 2022-07-31 LAB — CBC WITH DIFFERENTIAL (CANCER CENTER ONLY)
Abs Immature Granulocytes: 0.03 10*3/uL (ref 0.00–0.07)
Basophils Absolute: 0 10*3/uL (ref 0.0–0.1)
Basophils Relative: 1 %
Eosinophils Absolute: 0 10*3/uL (ref 0.0–0.5)
Eosinophils Relative: 1 %
HCT: 36.8 % (ref 36.0–46.0)
Hemoglobin: 12.1 g/dL (ref 12.0–15.0)
Immature Granulocytes: 1 %
Lymphocytes Relative: 11 %
Lymphs Abs: 0.5 10*3/uL — ABNORMAL LOW (ref 0.7–4.0)
MCH: 28.4 pg (ref 26.0–34.0)
MCHC: 32.9 g/dL (ref 30.0–36.0)
MCV: 86.4 fL (ref 80.0–100.0)
Monocytes Absolute: 0.5 10*3/uL (ref 0.1–1.0)
Monocytes Relative: 11 %
Neutro Abs: 3.2 10*3/uL (ref 1.7–7.7)
Neutrophils Relative %: 75 %
Platelet Count: 230 10*3/uL (ref 150–400)
RBC: 4.26 MIL/uL (ref 3.87–5.11)
RDW: 14.2 % (ref 11.5–15.5)
WBC Count: 4.2 10*3/uL (ref 4.0–10.5)
nRBC: 0 % (ref 0.0–0.2)

## 2022-07-31 MED ORDER — SODIUM CHLORIDE 0.9 % IV SOLN
10.0000 mg | Freq: Once | INTRAVENOUS | Status: AC
  Administered 2022-07-31: 10 mg via INTRAVENOUS
  Filled 2022-07-31: qty 10

## 2022-07-31 MED ORDER — SODIUM CHLORIDE 0.9 % IV SOLN
Freq: Once | INTRAVENOUS | Status: AC
  Filled 2022-07-31: qty 250

## 2022-07-31 MED ORDER — HEPARIN SOD (PORK) LOCK FLUSH 100 UNIT/ML IV SOLN
500.0000 [IU] | Freq: Once | INTRAVENOUS | Status: DC | PRN
  Administered 2022-07-31: 500 [IU]
  Filled 2022-07-31: qty 5

## 2022-07-31 MED ORDER — SODIUM CHLORIDE 0.9 % IV SOLN
45.0000 mg/m2 | Freq: Once | INTRAVENOUS | Status: AC
  Administered 2022-07-31: 84 mg via INTRAVENOUS
  Filled 2022-07-31: qty 14

## 2022-07-31 MED ORDER — DIPHENHYDRAMINE HCL 50 MG/ML IJ SOLN
25.0000 mg | Freq: Once | INTRAMUSCULAR | Status: AC
  Administered 2022-07-31: 25 mg via INTRAVENOUS
  Filled 2022-07-31: qty 1

## 2022-07-31 MED ORDER — PALONOSETRON HCL INJECTION 0.25 MG/5ML
0.2500 mg | Freq: Once | INTRAVENOUS | Status: AC
  Administered 2022-07-31: 0.25 mg via INTRAVENOUS
  Filled 2022-07-31: qty 5

## 2022-07-31 MED ORDER — FAMOTIDINE IN NACL 20-0.9 MG/50ML-% IV SOLN
20.0000 mg | Freq: Once | INTRAVENOUS | Status: AC
  Administered 2022-07-31: 20 mg via INTRAVENOUS
  Filled 2022-07-31: qty 50

## 2022-07-31 MED ORDER — SODIUM CHLORIDE 0.9 % IV SOLN
180.6000 mg | Freq: Once | INTRAVENOUS | Status: AC
  Administered 2022-07-31: 180 mg via INTRAVENOUS
  Filled 2022-07-31: qty 18

## 2022-07-31 NOTE — Progress Notes (Signed)
DISCONTINUE ON PATHWAY REGIMEN - Non-Small Cell Lung     A cycle is every 7 days, concurrent with RT:     Paclitaxel      Carboplatin   **Always confirm dose/schedule in your pharmacy ordering system**  REASON: Continuation Of Treatment PRIOR TREATMENT: [id number]: Carboplatin AUC=2 + Paclitaxel 45 mg/m2 Weekly During Radiation TREATMENT RESPONSE: Unable to Evaluate  START ON PATHWAY REGIMEN - Non-Small Cell Lung     A cycle is every 14 days:     Durvalumab   **Always confirm dose/schedule in your pharmacy ordering system**  Patient Characteristics: Preoperative or Nonsurgical Candidate (Clinical Staging), Stage III - Nonsurgical Candidate (Nonsquamous and Squamous), PS = 0, 1 Therapeutic Status: Preoperative or Nonsurgical Candidate (Clinical Staging) AJCC T Category: cT2b AJCC N Category: cN3 AJCC M Category: cM0 AJCC 8 Stage Grouping: IIIB ECOG Performance Status: 0 Intent of Therapy: Curative Intent, Discussed with Patient

## 2022-07-31 NOTE — Progress Notes (Signed)
[facility]  Telephone:(336) [phone] Fax:(336) [phone]  ID: Connie West OB: 1955/06/27  MR#: [id number]  AOZ#:[id number]  Patient Care Team: Excell Seltzer, MD as PCP - General Glory Buff, RN as Oncology Nurse Navigator  CHIEF COMPLAINT: Stage IIIb adenocarcinoma of the lung.  INTERVAL HISTORY: Patient returns to clinic today for further evaluation and consideration of her fifth and final cycle of weekly carboplatin and Taxol.  Her daily XRT will complete tomorrow.  She continues to have chronic weakness and fatigue, but otherwise feels well.  She has a mild dysphagia. She has no neurologic complaints.  She denies any recent fevers or illnesses.  She has a good appetite and denies weight loss.  She has no chest pain, shortness of breath, cough, or hemoptysis.  She denies any nausea, vomiting, constipation, or diarrhea.  She has no urinary complaints.  Patient offers no further specific complaints today.  REVIEW OF SYSTEMS:   Review of Systems  Constitutional:  Positive for malaise/fatigue. Negative for fever and weight loss.  Respiratory: Negative.  Negative for cough, hemoptysis and shortness of breath.   Cardiovascular: Negative.  Negative for chest pain and leg swelling.  Gastrointestinal: Negative.  Negative for abdominal pain.  Genitourinary: Negative.  Negative for dysuria.  Musculoskeletal: Negative.  Negative for back pain.  Skin: Negative.  Negative for rash.  Neurological:  Positive for weakness. Negative for dizziness, focal weakness and headaches.  Psychiatric/Behavioral: Negative.  The patient is not nervous/anxious.     As per HPI. Otherwise, a complete review of systems is negative.  PAST MEDICAL HISTORY: Past Medical History:  Diagnosis Date   Complication of anesthesia    Hyperlipidemia    PONV (postoperative nausea and vomiting)    TIA (transient ischemic attack)     PAST SURGICAL HISTORY: Past Surgical History:  Procedure Laterality  Date   BREAST CYST ASPIRATION Right 07/20/2012   FNA benign   BREAST CYST ASPIRATION Right    BREAST SURGERY Right 07-20-12   FNA benign   BRONCHIAL NEEDLE ASPIRATION BIOPSY  06/07/2022   Procedure: BRONCHIAL NEEDLE ASPIRATION BIOPSIES;  Surgeon: Raechel Chute, MD;  Location: [facility];  Service: Pulmonary;;   CESAREAN SECTION     CHOLECYSTECTOMY     COLONOSCOPY WITH PROPOFOL N/A 03/21/2021   Procedure: COLONOSCOPY WITH PROPOFOL;  Surgeon: Toney Reil, MD;  Location: [facility];  Service: Gastroenterology;  Laterality: N/A;   IR IMAGING GUIDED PORT INSERTION  06/21/2022   OVARY SURGERY     VIDEO BRONCHOSCOPY WITH ENDOBRONCHIAL ULTRASOUND N/A 06/07/2022   Procedure: VIDEO BRONCHOSCOPY WITH ENDOBRONCHIAL ULTRASOUND;  Surgeon: Raechel Chute, MD;  Location: [facility];  Service: Pulmonary;  Laterality: N/A;    FAMILY HISTORY: Family History  Problem Relation Age of Onset   Breast cancer Mother 38   Cancer Mother        breast   Cancer Father        bone cancer    ADVANCED DIRECTIVES (Y/N):  N  HEALTH MAINTENANCE: Social History   Tobacco Use   Smoking status: Former    Packs/day: 0.62    Years: 43.00    Additional pack years: 0.00    Total pack years: 26.66    Types: Cigarettes    Quit date: 2016    Years since quitting: 8.4   Smokeless tobacco: Never   Tobacco comments:    quit x 1 month 11/16  Vaping Use   Vaping Use: Never used  Substance Use Topics  Alcohol use: Yes    Comment: occasional : 1x/week   Drug use: No     Colonoscopy:  PAP:  Bone density:  Lipid panel:  Allergies  Allergen Reactions   Penicillins Hives    Current Outpatient Medications  Medication Sig Dispense Refill   ALPRAZolam (XANAX) 0.25 MG tablet Take 1 tablet (0.25 mg total) by mouth at bedtime as needed for anxiety. 30 tablet 0   aspirin EC 81 MG tablet Take 81 mg by mouth in the morning.     calcium carbonate (OS-CAL) 600 MG TABS Take 600 mg by mouth every evening.      Coenzyme Q10 (CO Q 10 PO) Take 1 capsule by mouth in the morning.     Fluocinolone Acetonide 0.01 % OIL Apply twice daily to ears as needed for rash/itching 20 mL 2   ibuprofen (ADVIL,MOTRIN) 200 MG tablet Take 400 mg by mouth every 8 (eight) hours as needed (pain.).     ketoconazole (NIZORAL) 2 % shampoo 2-3 times per week lather on scalp and ears, leave on 8-10 minutes, rinse well (Patient taking differently: Apply 1 Application topically once a week. Once per week lather on scalp and ears, leave on 8-10 minutes, rinse well) 120 mL 5   lidocaine-prilocaine (EMLA) cream Apply to affected area once 30 g 3   magic mouthwash SOLN Take 10 mLs by mouth 3 (three) times daily as needed. 300 mL 0   MAGNESIUM PO Take 1 tablet by mouth every evening.     OMEGA-3 FATTY ACIDS PO Take 1 g by mouth every evening.     ondansetron (ZOFRAN) 8 MG tablet Take 1 tablet (8 mg total) by mouth every 8 (eight) hours as needed for nausea or vomiting. Start on the third day after chemotherapy. 60 tablet 1   prochlorperazine (COMPAZINE) 10 MG tablet Take 1 tablet (10 mg total) by mouth every 6 (six) hours as needed for nausea or vomiting. 60 tablet 1   simvastatin (ZOCOR) 40 MG tablet TAKE 1 TABLET BY MOUTH EVERY DAY 90 tablet 3   VITAMIN D PO Take 1,000 Units by mouth in the morning.     sucralfate (CARAFATE) 1 g tablet Take 1 tablet (1 g total) by mouth 3 (three) times daily. Dissolve tablet in 4 tablespoons warm water swish and swallow. (Patient not taking: Reported on 07/17/2022) 90 tablet 1   No current facility-administered medications for this visit.   Facility-Administered Medications Ordered in Other Visits  Medication Dose Route Frequency Provider Last Rate Last Admin   heparin lock flush 100 unit/mL  500 Units Intracatheter Once PRN Jeralyn Ruths, MD        OBJECTIVE: Vitals:   07/31/22 1016  BP: 126/84  Pulse: (!) 106  Resp: 16  Temp: 97.8 F (36.6 C)  SpO2: 98%     Body mass index is 26.23  kg/m.    ECOG FS:0 - Asymptomatic  General: Well-developed, well-nourished, no acute distress. Eyes: Pink conjunctiva, anicteric sclera. HEENT: Normocephalic, moist mucous membranes. Lungs: No audible wheezing or coughing. Heart: Regular rate and rhythm. Abdomen: Soft, nontender, no obvious distention. Musculoskeletal: No edema, cyanosis, or clubbing. Neuro: Alert, answering all questions appropriately. Cranial nerves grossly intact. Skin: No rashes or petechiae noted. Psych: Normal affect.  LAB RESULTS:  Lab Results  Component Value Date   NA 135 07/31/2022   K 4.1 07/31/2022   CL 99 07/31/2022   CO2 25 07/31/2022   GLUCOSE 118 (H) 07/31/2022   BUN 16  07/31/2022   CREATININE 0.65 07/31/2022   CALCIUM 9.6 07/31/2022   PROT 7.2 07/31/2022   ALBUMIN 3.7 07/31/2022   AST 23 07/31/2022   ALT 26 07/31/2022   ALKPHOS 58 07/31/2022   BILITOT 0.6 07/31/2022   GFRNONAA >60 07/31/2022   GFRAA >60 01/04/2018    Lab Results  Component Value Date   WBC 4.2 07/31/2022   NEUTROABS 3.2 07/31/2022   HGB 12.1 07/31/2022   HCT 36.8 07/31/2022   MCV 86.4 07/31/2022   PLT 230 07/31/2022     STUDIES: No results found.  ASSESSMENT: Stage IIIb adenocarcinoma of the lung.  PLAN:    Stage IIIb adenocarcinoma of the lung: Biopsy from bronchoscopy on June 07, 2022 confirming the diagnosis.  PET scan results from May 22, 2022 reviewed independently confirming stage of disease.  The bilateral hypermetabolic cervical lymph nodes are suspicious, but will treat patient as a stage IIIb.  MRI of the brain on Jun 19, 2022 did not reveal any metastatic disease.  Plan to give patient weekly carboplatin and Taxol along with daily XRT.  Once patient completes treatment, this will be followed by year-long durvalumab every 2 weeks.  Patient has had port placement.  Proceed with her fifth and final treatment today.  Patient will complete concurrent XRT tomorrow.  Return to clinic in 2 weeks for further  evaluation and initiation of cycle 1 of maintenance durvalumab.  Dysphagia: Mild.  Continue Magic mouthwash as prescribed. Insomnia: Patient does not complain of this today.  Continue Xanax as needed at night. Hyponatremia: Resolved. Leukopenia: Resolved.  I spent a total of 30 minutes reviewing chart data, face-to-face evaluation with the patient, counseling and coordination of care as detailed above.   Patient expressed understanding and was in agreement with this plan. She also understands that She can call clinic at any time with any questions, concerns, or complaints.    Cancer Staging  Adenocarcinoma of right lung [facility]) Staging form: Lung, AJCC 8th Edition - Clinical stage from 06/12/2022: Stage IIIB (cT2b, cN3, cM0) - Signed by Jeralyn Ruths, MD on 06/12/2022 Stage prefix: Initial diagnosis   Jeralyn Ruths, MD   07/31/2022 2:43 PM

## 2022-07-31 NOTE — Patient Instructions (Signed)
[facility] [place] [facility]  Discharge Instructions: Thank you for choosing [facility] to provide your oncology and hematology care.  If you have a lab appointment with the Cancer Center, please go directly to the Cancer Center and check in at the registration area.  Wear comfortable clothing and clothing appropriate for easy access to any Portacath or PICC line.   We strive to give you quality time with your provider. You may need to reschedule your appointment if you arrive late (15 or more minutes).  Arriving late affects you and other patients whose appointments are after yours.  Also, if you miss three or more appointments without notifying the office, you may be dismissed from the clinic at the provider's discretion.      For prescription refill requests, have your pharmacy contact our office and allow 72 hours for refills to be completed.     To help prevent nausea and vomiting after your treatment, we encourage you to take your nausea medication as directed.  BELOW ARE SYMPTOMS THAT SHOULD BE REPORTED IMMEDIATELY: *FEVER GREATER THAN 100.4 F (38 C) OR HIGHER *CHILLS OR SWEATING *NAUSEA AND VOMITING THAT IS NOT CONTROLLED WITH YOUR NAUSEA MEDICATION *UNUSUAL SHORTNESS OF BREATH *UNUSUAL BRUISING OR BLEEDING *URINARY PROBLEMS (pain or burning when urinating, or frequent urination) *BOWEL PROBLEMS (unusual diarrhea, constipation, pain near the anus) TENDERNESS IN MOUTH AND THROAT WITH OR WITHOUT PRESENCE OF ULCERS (sore throat, sores in mouth, or a toothache) UNUSUAL RASH, SWELLING OR PAIN  UNUSUAL VAGINAL DISCHARGE OR ITCHING   Items with * indicate a potential emergency and should be followed up as soon as possible or go to the Emergency Department if any problems should occur.  Please show the CHEMOTHERAPY ALERT CARD or IMMUNOTHERAPY ALERT CARD at check-in to the Emergency Department and triage nurse.  Should you have questions after your visit  or need to cancel or reschedule your appointment, please contact [facility] [place] [facility]  [phone] and follow the prompts.  Office hours are 8:00 a.m. to 4:30 p.m. Monday - Friday. Please note that voicemails left after 4:00 p.m. may not be returned until the following business day.  We are closed weekends and major holidays. You have access to a nurse at all times for urgent questions. Please call the main number to the clinic [phone] and follow the prompts.  For any non-urgent questions, you may also contact your provider using MyChart. We now offer e-Visits for anyone 18 and older to request care online for non-urgent symptoms. For details visit [email or web address].   Also download the MyChart app! Go to the app store, search "MyChart", open the app, select , and log in with your MyChart username and password.    

## 2022-08-01 ENCOUNTER — Other Ambulatory Visit: Payer: Self-pay

## 2022-08-01 ENCOUNTER — Ambulatory Visit
Admission: RE | Admit: 2022-08-01 | Discharge: 2022-08-01 | Disposition: A | Discharge status: Home / Self Care | Payer: Medicare Other | Source: Ambulatory Visit | Attending: Radiation Oncology | Admitting: Radiation Oncology

## 2022-08-01 DIAGNOSIS — Z87891 Personal history of nicotine dependence: Secondary | ICD-10-CM | POA: Diagnosis not present

## 2022-08-01 DIAGNOSIS — C3491 Malignant neoplasm of unspecified part of right bronchus or lung: Secondary | ICD-10-CM | POA: Diagnosis not present

## 2022-08-01 DIAGNOSIS — Z510 Encounter for antineoplastic radiation therapy: Secondary | ICD-10-CM | POA: Diagnosis not present

## 2022-08-01 DIAGNOSIS — C3411 Malignant neoplasm of upper lobe, right bronchus or lung: Secondary | ICD-10-CM | POA: Diagnosis not present

## 2022-08-01 LAB — RAD ONC ARIA SESSION SUMMARY
Course Elapsed Days: 28
Plan Fractions Treated to Date: 20
Plan Prescribed Dose Per Fraction: 2 Gy
Plan Total Fractions Prescribed: 20
Plan Total Prescribed Dose: 40 Gy
Reference Point Dosage Given to Date: 40 Gy
Reference Point Session Dosage Given: 2 Gy
Session Number: 20

## 2022-08-02 ENCOUNTER — Ambulatory Visit: Payer: Medicare Other

## 2022-08-02 ENCOUNTER — Other Ambulatory Visit: Payer: Self-pay

## 2022-08-05 ENCOUNTER — Ambulatory Visit: Payer: Medicare Other

## 2022-08-06 ENCOUNTER — Ambulatory Visit: Payer: Medicare Other

## 2022-08-07 ENCOUNTER — Ambulatory Visit: Payer: Medicare Other

## 2022-08-07 ENCOUNTER — Ambulatory Visit: Payer: Medicare Other | Admitting: Medical Oncology

## 2022-08-07 ENCOUNTER — Other Ambulatory Visit: Payer: Medicare Other

## 2022-08-08 ENCOUNTER — Ambulatory Visit: Payer: Medicare Other

## 2022-08-09 ENCOUNTER — Ambulatory Visit: Payer: Medicare Other

## 2022-08-09 ENCOUNTER — Encounter: Payer: Self-pay | Admitting: *Deleted

## 2022-08-09 ENCOUNTER — Other Ambulatory Visit: Payer: Self-pay | Admitting: Oncology

## 2022-08-09 DIAGNOSIS — C3491 Malignant neoplasm of unspecified part of right bronchus or lung: Secondary | ICD-10-CM

## 2022-08-12 ENCOUNTER — Ambulatory Visit: Payer: Medicare Other

## 2022-08-13 ENCOUNTER — Ambulatory Visit: Payer: Medicare Other

## 2022-08-13 ENCOUNTER — Encounter: Payer: Self-pay | Admitting: Oncology

## 2022-08-14 ENCOUNTER — Ambulatory Visit: Payer: Medicare Other | Admitting: Oncology

## 2022-08-14 ENCOUNTER — Ambulatory Visit: Payer: Medicare Other

## 2022-08-14 ENCOUNTER — Inpatient Hospital Stay: Payer: Medicare Other | Attending: Oncology

## 2022-08-14 ENCOUNTER — Inpatient Hospital Stay (HOSPITAL_BASED_OUTPATIENT_CLINIC_OR_DEPARTMENT_OTHER): Payer: Medicare Other | Admitting: Oncology

## 2022-08-14 ENCOUNTER — Other Ambulatory Visit: Payer: Medicare Other

## 2022-08-14 ENCOUNTER — Encounter: Payer: Self-pay | Admitting: Oncology

## 2022-08-14 ENCOUNTER — Inpatient Hospital Stay: Payer: Medicare Other

## 2022-08-14 VITALS — HR 104

## 2022-08-14 DIAGNOSIS — C3491 Malignant neoplasm of unspecified part of right bronchus or lung: Secondary | ICD-10-CM

## 2022-08-14 DIAGNOSIS — Z5112 Encounter for antineoplastic immunotherapy: Secondary | ICD-10-CM | POA: Insufficient documentation

## 2022-08-14 DIAGNOSIS — C3411 Malignant neoplasm of upper lobe, right bronchus or lung: Secondary | ICD-10-CM | POA: Insufficient documentation

## 2022-08-14 DIAGNOSIS — R1310 Dysphagia, unspecified: Secondary | ICD-10-CM | POA: Insufficient documentation

## 2022-08-14 DIAGNOSIS — Z79899 Other long term (current) drug therapy: Secondary | ICD-10-CM | POA: Diagnosis not present

## 2022-08-14 DIAGNOSIS — D709 Neutropenia, unspecified: Secondary | ICD-10-CM | POA: Insufficient documentation

## 2022-08-14 DIAGNOSIS — E871 Hypo-osmolality and hyponatremia: Secondary | ICD-10-CM | POA: Insufficient documentation

## 2022-08-14 DIAGNOSIS — D72819 Decreased white blood cell count, unspecified: Secondary | ICD-10-CM | POA: Insufficient documentation

## 2022-08-14 DIAGNOSIS — D649 Anemia, unspecified: Secondary | ICD-10-CM | POA: Insufficient documentation

## 2022-08-14 DIAGNOSIS — G629 Polyneuropathy, unspecified: Secondary | ICD-10-CM | POA: Diagnosis not present

## 2022-08-14 DIAGNOSIS — G4700 Insomnia, unspecified: Secondary | ICD-10-CM | POA: Insufficient documentation

## 2022-08-14 DIAGNOSIS — Z79620 Long term (current) use of immunosuppressive biologic: Secondary | ICD-10-CM | POA: Insufficient documentation

## 2022-08-14 LAB — CMP (CANCER CENTER ONLY)
ALT: 22 U/L (ref 0–44)
AST: 26 U/L (ref 15–41)
Albumin: 3.3 g/dL — ABNORMAL LOW (ref 3.5–5.0)
Alkaline Phosphatase: 52 U/L (ref 38–126)
Anion gap: 8 (ref 5–15)
BUN: 15 mg/dL (ref 8–23)
CO2: 22 mmol/L (ref 22–32)
Calcium: 8.5 mg/dL — ABNORMAL LOW (ref 8.9–10.3)
Chloride: 102 mmol/L (ref 98–111)
Creatinine: 0.73 mg/dL (ref 0.44–1.00)
GFR, Estimated: >60 mL/min (ref >60)
Glucose, Bld: 141 mg/dL — ABNORMAL HIGH (ref 70–99)
Potassium: 3.8 mmol/L (ref 3.5–5.1)
Sodium: 132 mmol/L — ABNORMAL LOW (ref 135–145)
Total Bilirubin: 0.8 mg/dL (ref 0.3–1.2)
Total Protein: 6.6 g/dL (ref 6.5–8.1)

## 2022-08-14 LAB — CBC WITH DIFFERENTIAL (CANCER CENTER ONLY)
Abs Immature Granulocytes: 0.01 10*3/uL (ref 0.00–0.07)
Basophils Absolute: 0 10*3/uL (ref 0.0–0.1)
Basophils Relative: 1 %
Eosinophils Absolute: 0 10*3/uL (ref 0.0–0.5)
Eosinophils Relative: 1 %
HCT: 32 % — ABNORMAL LOW (ref 36.0–46.0)
Hemoglobin: 10.8 g/dL — ABNORMAL LOW (ref 12.0–15.0)
Immature Granulocytes: 1 %
Lymphocytes Relative: 35 %
Lymphs Abs: 0.7 10*3/uL (ref 0.7–4.0)
MCH: 29.3 pg (ref 26.0–34.0)
MCHC: 33.8 g/dL (ref 30.0–36.0)
MCV: 87 fL (ref 80.0–100.0)
Monocytes Absolute: 0.2 10*3/uL (ref 0.1–1.0)
Monocytes Relative: 10 %
Neutro Abs: 1 10*3/uL — ABNORMAL LOW (ref 1.7–7.7)
Neutrophils Relative %: 52 %
Platelet Count: 150 10*3/uL (ref 150–400)
RBC: 3.68 MIL/uL — ABNORMAL LOW (ref 3.87–5.11)
RDW: 16.3 % — ABNORMAL HIGH (ref 11.5–15.5)
WBC Count: 1.8 10*3/uL — ABNORMAL LOW (ref 4.0–10.5)
nRBC: 0 % (ref 0.0–0.2)

## 2022-08-14 LAB — TSH: TSH: 1.816 u[IU]/mL (ref 0.350–4.500)

## 2022-08-14 MED ORDER — SODIUM CHLORIDE 0.9 % IV SOLN
10.0000 mg/kg | Freq: Once | INTRAVENOUS | Status: AC
  Administered 2022-08-14: 740 mg via INTRAVENOUS
  Filled 2022-08-14: qty 14.8

## 2022-08-14 MED ORDER — SODIUM CHLORIDE 0.9 % IV SOLN
Freq: Once | INTRAVENOUS | Status: AC
  Filled 2022-08-14: qty 250

## 2022-08-14 MED ORDER — HEPARIN SOD (PORK) LOCK FLUSH 100 UNIT/ML IV SOLN
500.0000 [IU] | Freq: Once | INTRAVENOUS | Status: AC | PRN
  Administered 2022-08-14: 500 [IU]
  Filled 2022-08-14: qty 5

## 2022-08-14 NOTE — Patient Instructions (Signed)
[facility] [place] [facility]  Discharge Instructions: Thank you for choosing [facility] to provide your oncology and hematology care.  If you have a lab appointment with the Cancer Center, please go directly to the Cancer Center and check in at the registration area.  Wear comfortable clothing and clothing appropriate for easy access to any Portacath or PICC line.   We strive to give you quality time with your provider. You may need to reschedule your appointment if you arrive late (15 or more minutes).  Arriving late affects you and other patients whose appointments are after yours.  Also, if you miss three or more appointments without notifying the office, you may be dismissed from the clinic at the provider's discretion.      For prescription refill requests, have your pharmacy contact our office and allow 72 hours for refills to be completed.    Today you received the following chemotherapy and/or immunotherapy agents- Durvalumab      To help prevent nausea and vomiting after your treatment, we encourage you to take your nausea medication as directed.  BELOW ARE SYMPTOMS THAT SHOULD BE REPORTED IMMEDIATELY: *FEVER GREATER THAN 100.4 F (38 C) OR HIGHER *CHILLS OR SWEATING *NAUSEA AND VOMITING THAT IS NOT CONTROLLED WITH YOUR NAUSEA MEDICATION *UNUSUAL SHORTNESS OF BREATH *UNUSUAL BRUISING OR BLEEDING *URINARY PROBLEMS (pain or burning when urinating, or frequent urination) *BOWEL PROBLEMS (unusual diarrhea, constipation, pain near the anus) TENDERNESS IN MOUTH AND THROAT WITH OR WITHOUT PRESENCE OF ULCERS (sore throat, sores in mouth, or a toothache) UNUSUAL RASH, SWELLING OR PAIN  UNUSUAL VAGINAL DISCHARGE OR ITCHING   Items with * indicate a potential emergency and should be followed up as soon as possible or go to the Emergency Department if any problems should occur.  Please show the CHEMOTHERAPY ALERT CARD or IMMUNOTHERAPY ALERT CARD at check-in  to the Emergency Department and triage nurse.  Should you have questions after your visit or need to cancel or reschedule your appointment, please contact [facility] [place] [facility]  [phone] and follow the prompts.  Office hours are 8:00 a.m. to 4:30 p.m. Monday - Friday. Please note that voicemails left after 4:00 p.m. may not be returned until the following business day.  We are closed weekends and major holidays. You have access to a nurse at all times for urgent questions. Please call the main number to the clinic [phone] and follow the prompts.  For any non-urgent questions, you may also contact your provider using MyChart. We now offer e-Visits for anyone 18 and older to request care online for non-urgent symptoms. For details visit [email or web address].   Also download the MyChart app! Go to the app store, search "MyChart", open the app, select , and log in with your MyChart username and password.    

## 2022-08-14 NOTE — Progress Notes (Signed)
Having concerns of feeling very tired. Sore in mouth. Magic mouthwash was denied. Gets cold really easily.

## 2022-08-14 NOTE — Progress Notes (Signed)
[facility]  [phone] [phone]) [phone]  ID: [name] OB: [birth year]  MR#: [id number]  AOZ#:[id number]  Patient Care Team: Excell Seltzer, MD as PCP - General Glory Buff, RN as Oncology Nurse Navigator Orlie Dakin, Tollie Pizza, MD as Consulting Physician (Oncology)  CHIEF COMPLAINT: Stage IIIb adenocarcinoma of the lung.  INTERVAL HISTORY: Patient returns to clinic today for further evaluation and initiation of maintenance durvalumab.  She continues to have weakness and fatigue, but otherwise feels well. She has no neurologic complaints.  She denies any recent fevers or illnesses.  She has a good appetite and denies weight loss.  She has no chest pain, shortness of breath, cough, or hemoptysis.  She denies any nausea, vomiting, constipation, or diarrhea.  She has no urinary complaints.  Patient offers no further specific complaints today.  REVIEW OF SYSTEMS:   Review of Systems  Constitutional:  Positive for malaise/fatigue. Negative for fever and weight loss.  Respiratory: Negative.  Negative for cough, hemoptysis and shortness of breath.   Cardiovascular: Negative.  Negative for chest pain and leg swelling.  Gastrointestinal: Negative.  Negative for abdominal pain.  Genitourinary: Negative.  Negative for dysuria.  Musculoskeletal: Negative.  Negative for back pain.  Skin: Negative.  Negative for rash.  Neurological:  Positive for weakness. Negative for dizziness, focal weakness and headaches.  Psychiatric/Behavioral: Negative.  The patient is not nervous/anxious.     As per HPI. Otherwise, a complete review of systems is negative.  PAST MEDICAL HISTORY: Past Medical History:  Diagnosis Date   Complication of anesthesia    Hyperlipidemia    PONV (postoperative nausea and vomiting)    TIA (transient ischemic attack)     PAST SURGICAL HISTORY: Past Surgical History:  Procedure Laterality Date   BREAST CYST ASPIRATION Right 07/20/2012   FNA  benign   BREAST CYST ASPIRATION Right    BREAST SURGERY Right 07-20-12   FNA benign   BRONCHIAL NEEDLE ASPIRATION BIOPSY  06/07/2022   Procedure: BRONCHIAL NEEDLE ASPIRATION BIOPSIES;  Surgeon: Raechel Chute, MD;  [facility];  Service: Pulmonary;;   CESAREAN SECTION     CHOLECYSTECTOMY     COLONOSCOPY WITH PROPOFOL N/A 03/21/2021   Procedure: COLONOSCOPY WITH PROPOFOL;  Surgeon: Toney Reil, MD;  Location: [facility];  Service: Gastroenterology;  Laterality: N/A;   IR IMAGING GUIDED PORT INSERTION  06/21/2022   OVARY SURGERY     VIDEO BRONCHOSCOPY WITH ENDOBRONCHIAL ULTRASOUND N/A 06/07/2022   Procedure: VIDEO BRONCHOSCOPY WITH ENDOBRONCHIAL ULTRASOUND;  Surgeon: Raechel Chute, MD;  Location: [facility];  Service: Pulmonary;  Laterality: N/A;    FAMILY HISTORY: Family History  Problem Relation Age of Onset   Breast cancer Mother 78   Cancer Mother        breast   Cancer Father        bone cancer    ADVANCED DIRECTIVES (Y/N):  N  HEALTH MAINTENANCE: Social History   Tobacco Use   Smoking status: Former    Packs/day: 0.62    Years: 43.00    Additional pack years: 0.00    Total pack years: 26.66    Types: Cigarettes    Quit date: 2016    Years since quitting: 8.5   Smokeless tobacco: Never   Tobacco comments:    quit x 1 month 11/16  Vaping Use   Vaping Use: Never used  Substance Use Topics   Alcohol use: Yes    Comment: occasional : 1x/week   Drug use:  No     Colonoscopy:  PAP:  Bone density:  Lipid panel:  Allergies  Allergen Reactions   Penicillins Hives    Current Outpatient Medications  Medication Sig Dispense Refill   ALPRAZolam (XANAX) 0.25 MG tablet Take 1 tablet (0.25 mg total) by mouth at bedtime as needed for anxiety. 30 tablet 0   aspirin EC 81 MG tablet Take 81 mg by mouth in the morning.     calcium carbonate (OS-CAL) 600 MG TABS Take 600 mg by mouth every evening.     Coenzyme Q10 (CO Q 10 PO) Take 1 capsule by mouth  in the morning.     Fluocinolone Acetonide 0.01 % OIL Apply twice daily to ears as needed for rash/itching 20 mL 2   ibuprofen (ADVIL,MOTRIN) 200 MG tablet Take 400 mg by mouth every 8 (eight) hours as needed (pain.).     ketoconazole (NIZORAL) 2 % shampoo 2-3 times per week lather on scalp and ears, leave on 8-10 minutes, rinse well (Patient taking differently: Apply 1 Application topically once a week. Once per week lather on scalp and ears, leave on 8-10 minutes, rinse well) 120 mL 5   lidocaine-prilocaine (EMLA) cream Apply 1 Application topically as needed.     magic mouthwash SOLN Take 10 mLs by mouth 3 (three) times daily as needed. 300 mL 0   MAGNESIUM PO Take 1 tablet by mouth every evening.     OMEGA-3 FATTY ACIDS PO Take 1 g by mouth every evening.     simvastatin (ZOCOR) 40 MG tablet TAKE 1 TABLET BY MOUTH EVERY DAY 90 tablet 3   VITAMIN D PO Take 1,000 Units by mouth in the morning.     sucralfate (CARAFATE) 1 g tablet Take 1 tablet (1 g total) by mouth 3 (three) times daily. Dissolve tablet in 4 tablespoons warm water swish and swallow. (Patient not taking: Reported on 07/17/2022) 90 tablet 1   No current facility-administered medications for this visit.    OBJECTIVE: Vitals:   08/14/22 0934  BP: 122/81  Pulse: (!) 114  Resp: 16  Temp: 98 F (36.7 C)  SpO2: 98%     Body mass index is 26.37 kg/m.    ECOG FS:0 - Asymptomatic  General: Well-developed, well-nourished, no acute distress. Eyes: Pink conjunctiva, anicteric sclera. HEENT: Normocephalic, moist mucous membranes. Lungs: No audible wheezing or coughing. Heart: Regular rate and rhythm. Abdomen: Soft, nontender, no obvious distention. Musculoskeletal: No edema, cyanosis, or clubbing. Neuro: Alert, answering all questions appropriately. Cranial nerves grossly intact. Skin: No rashes or petechiae noted. Psych: Normal affect.  LAB RESULTS:  Lab Results  Component Value Date   NA 132 (L) 08/14/2022   K 3.8  08/14/2022   CL 102 08/14/2022   CO2 22 08/14/2022   GLUCOSE 141 (H) 08/14/2022   BUN 15 08/14/2022   CREATININE 0.73 08/14/2022   CALCIUM 8.5 (L) 08/14/2022   PROT 6.6 08/14/2022   ALBUMIN 3.3 (L) 08/14/2022   AST 26 08/14/2022   ALT 22 08/14/2022   ALKPHOS 52 08/14/2022   BILITOT 0.8 08/14/2022   GFRNONAA >60 08/14/2022   GFRAA >60 01/04/2018    Lab Results  Component Value Date   WBC 1.8 (L) 08/14/2022   NEUTROABS 1.0 (L) 08/14/2022   HGB 10.8 (L) 08/14/2022   HCT 32.0 (L) 08/14/2022   MCV 87.0 08/14/2022   PLT 150 08/14/2022     STUDIES: No results found.  ASSESSMENT: Stage IIIb adenocarcinoma of the lung.  PLAN:  Stage IIIb adenocarcinoma of the lung: Biopsy from bronchoscopy on June 07, 2022 confirming the diagnosis.  PET scan results from May 22, 2022 reviewed independently confirming stage of disease.  The bilateral hypermetabolic cervical lymph nodes are suspicious, but will treat patient as a stage IIIb.  MRI of the brain on Jun 19, 2022 did not reveal any metastatic disease.  Patient completed weekly carboplatin and Taxol on July 31, 2022.  Proceed with cycle 1 of maintenance durvalumab.  She received treatment every 2 weeks for an entire year.  Return to clinic in 2 weeks for further evaluation and consideration of cycle 2. Dysphagia: Patient does not complain of this today.  Continue Magic mouthwash as prescribed. Insomnia:  Continue Xanax as needed at night. Hyponatremia: Mild, monitor. Neutropenia: Mild, proceed with treatment as above. Anemia: Mild, monitor.  Patient's hemoglobin is 10.8 today.  Patient expressed understanding and was in agreement with this plan. She also understands that She can call clinic at any time with any questions, concerns, or complaints.    Cancer Staging  Adenocarcinoma of right lung [facility]) Staging form: Lung, AJCC 8th Edition - Clinical stage from 06/12/2022: Stage IIIB (cT2b, cN3, cM0) - Signed by Jeralyn Ruths, MD on  06/12/2022 Stage prefix: Initial diagnosis   Jeralyn Ruths, MD   08/14/2022 12:38 PM

## 2022-08-15 ENCOUNTER — Other Ambulatory Visit: Payer: Self-pay

## 2022-08-16 ENCOUNTER — Ambulatory Visit (HOSPITAL_BASED_OUTPATIENT_CLINIC_OR_DEPARTMENT_OTHER): Payer: Medicare Other | Admitting: Medical Oncology

## 2022-08-16 ENCOUNTER — Inpatient Hospital Stay: Payer: Medicare Other

## 2022-08-16 ENCOUNTER — Ambulatory Visit: Payer: Medicare Other

## 2022-08-16 ENCOUNTER — Encounter: Payer: Self-pay | Admitting: Medical Oncology

## 2022-08-16 ENCOUNTER — Telehealth: Payer: Self-pay | Admitting: *Deleted

## 2022-08-16 ENCOUNTER — Other Ambulatory Visit: Payer: Self-pay | Admitting: *Deleted

## 2022-08-16 VITALS — BP 125/90 | HR 115 | Temp 97.9°F | Wt 163.0 lb

## 2022-08-16 DIAGNOSIS — D72819 Decreased white blood cell count, unspecified: Secondary | ICD-10-CM | POA: Diagnosis not present

## 2022-08-16 DIAGNOSIS — Z79899 Other long term (current) drug therapy: Secondary | ICD-10-CM | POA: Diagnosis not present

## 2022-08-16 DIAGNOSIS — D709 Neutropenia, unspecified: Secondary | ICD-10-CM | POA: Diagnosis not present

## 2022-08-16 DIAGNOSIS — R509 Fever, unspecified: Secondary | ICD-10-CM

## 2022-08-16 DIAGNOSIS — R1310 Dysphagia, unspecified: Secondary | ICD-10-CM | POA: Diagnosis not present

## 2022-08-16 DIAGNOSIS — E871 Hypo-osmolality and hyponatremia: Secondary | ICD-10-CM | POA: Diagnosis not present

## 2022-08-16 DIAGNOSIS — C3491 Malignant neoplasm of unspecified part of right bronchus or lung: Secondary | ICD-10-CM

## 2022-08-16 DIAGNOSIS — Z95828 Presence of other vascular implants and grafts: Secondary | ICD-10-CM

## 2022-08-16 DIAGNOSIS — Z5112 Encounter for antineoplastic immunotherapy: Secondary | ICD-10-CM | POA: Diagnosis not present

## 2022-08-16 DIAGNOSIS — D649 Anemia, unspecified: Secondary | ICD-10-CM | POA: Diagnosis not present

## 2022-08-16 DIAGNOSIS — C3411 Malignant neoplasm of upper lobe, right bronchus or lung: Secondary | ICD-10-CM | POA: Diagnosis not present

## 2022-08-16 DIAGNOSIS — G4700 Insomnia, unspecified: Secondary | ICD-10-CM | POA: Diagnosis not present

## 2022-08-16 DIAGNOSIS — G629 Polyneuropathy, unspecified: Secondary | ICD-10-CM | POA: Diagnosis not present

## 2022-08-16 DIAGNOSIS — Z79620 Long term (current) use of immunosuppressive biologic: Secondary | ICD-10-CM | POA: Diagnosis not present

## 2022-08-16 LAB — CMP (CANCER CENTER ONLY)
ALT: 23 U/L (ref 0–44)
AST: 28 U/L (ref 15–41)
Albumin: 3.5 g/dL (ref 3.5–5.0)
Alkaline Phosphatase: 48 U/L (ref 38–126)
Anion gap: 11 (ref 5–15)
BUN: 14 mg/dL (ref 8–23)
CO2: 21 mmol/L — ABNORMAL LOW (ref 22–32)
Calcium: 8.8 mg/dL — ABNORMAL LOW (ref 8.9–10.3)
Chloride: 100 mmol/L (ref 98–111)
Creatinine: 0.76 mg/dL (ref 0.44–1.00)
GFR, Estimated: >60 mL/min (ref >60)
Glucose, Bld: 158 mg/dL — ABNORMAL HIGH (ref 70–99)
Potassium: 3.3 mmol/L — ABNORMAL LOW (ref 3.5–5.1)
Sodium: 132 mmol/L — ABNORMAL LOW (ref 135–145)
Total Bilirubin: 0.7 mg/dL (ref 0.3–1.2)
Total Protein: 7 g/dL (ref 6.5–8.1)

## 2022-08-16 LAB — LACTIC ACID, PLASMA: Lactic Acid, Venous: 1.8 mmol/L (ref 0.5–1.9)

## 2022-08-16 LAB — CBC WITH DIFFERENTIAL/PLATELET
Abs Immature Granulocytes: 0.01 10*3/uL (ref 0.00–0.07)
Basophils Absolute: 0 10*3/uL (ref 0.0–0.1)
Basophils Relative: 1 %
Eosinophils Absolute: 0 10*3/uL (ref 0.0–0.5)
Eosinophils Relative: 1 %
HCT: 30.7 % — ABNORMAL LOW (ref 36.0–46.0)
Hemoglobin: 10.5 g/dL — ABNORMAL LOW (ref 12.0–15.0)
Immature Granulocytes: 1 %
Lymphocytes Relative: 36 %
Lymphs Abs: 0.7 10*3/uL (ref 0.7–4.0)
MCH: 29.6 pg (ref 26.0–34.0)
MCHC: 34.2 g/dL (ref 30.0–36.0)
MCV: 86.5 fL (ref 80.0–100.0)
Monocytes Absolute: 0.2 10*3/uL (ref 0.1–1.0)
Monocytes Relative: 10 %
Neutro Abs: 1 10*3/uL — ABNORMAL LOW (ref 1.7–7.7)
Neutrophils Relative %: 51 %
Platelets: 162 10*3/uL (ref 150–400)
RBC: 3.55 MIL/uL — ABNORMAL LOW (ref 3.87–5.11)
RDW: 16.8 % — ABNORMAL HIGH (ref 11.5–15.5)
WBC: 1.9 10*3/uL — ABNORMAL LOW (ref 4.0–10.5)
nRBC: 0 % (ref 0.0–0.2)

## 2022-08-16 LAB — T4: T4, Total: 9.5 ug/dL (ref 4.5–12.0)

## 2022-08-16 MED ORDER — SODIUM CHLORIDE 0.9% FLUSH
10.0000 mL | Freq: Once | INTRAVENOUS | Status: AC
  Administered 2022-08-16: 10 mL via INTRAVENOUS
  Filled 2022-08-16: qty 10

## 2022-08-16 MED ORDER — HEPARIN SOD (PORK) LOCK FLUSH 100 UNIT/ML IV SOLN
500.0000 [IU] | Freq: Once | INTRAVENOUS | Status: AC
  Administered 2022-08-16: 500 [IU]
  Filled 2022-08-16: qty 5

## 2022-08-16 MED ORDER — CIPROFLOXACIN HCL 500 MG PO TABS: 500.0000 mg | ORAL_TABLET | Freq: Two times a day (BID) | ORAL | 0 refills | Status: AC

## 2022-08-16 MED ORDER — SODIUM CHLORIDE 0.9 % IV SOLN
INTRAVENOUS | Status: DC
  Filled 2022-08-16 (×2): qty 250

## 2022-08-16 NOTE — Telephone Encounter (Signed)
Patient called reporting that since Wednesday evening, She has had chills and fever Her highest temp was last night 102. She has no other symptoms other than the chills and feeling weak and tired. She states that her temp this morning is 98.8, she states that she took Tylenol before bed last night. Please advise

## 2022-08-16 NOTE — Telephone Encounter (Signed)
Patient scheduled for Symptom Management Clinic today

## 2022-08-16 NOTE — Progress Notes (Signed)
Symptom Management Clinic [facility] [phone]) [phone] Fax:(336) [phone]  Patient Care Team: Excell Seltzer, MD as PCP - General Glory Buff, RN as Oncology Nurse Navigator Jeralyn Ruths, MD as Consulting Physician (Oncology)   Name of the patient: Connie West  [id number]  1955-08-01   Oncological History: Stage IIIb adenocarcinoma of the lung.   Current Treatment: Durvalumab  Date of visit: 08/16/22  Reason for Consult: Connie West is a 67 y.o. female who presents today for:  Fever: Patient reports that last night she came down with a fever, chills, fatigue and malaise. Mild headache. Tmax 102. This morning temperature was 101F. Fever does reduce with OTC medications. No known sick contacts. No cough, sore throat, N/V/D, tick bites, no dysuria or wounds of the skin. She normally is an active outdoor person but less recently with treatments.   Denies any neurologic complaints.Denies any easy bleeding or bruising. Reports good appetite and denies weight loss. Denies chest pain. Denies any nausea, vomiting, constipation, or diarrhea. Denies urinary complaints. Patient offers no further specific complaints today.  PAST MEDICAL HISTORY: Past Medical History:  Diagnosis Date   Complication of anesthesia    Hyperlipidemia    PONV (postoperative nausea and vomiting)    TIA (transient ischemic attack)     PAST SURGICAL HISTORY:  Past Surgical History:  Procedure Laterality Date   BREAST CYST ASPIRATION Right 07/20/2012   FNA benign   BREAST CYST ASPIRATION Right    BREAST SURGERY Right 07-20-12   FNA benign   BRONCHIAL NEEDLE ASPIRATION BIOPSY  06/07/2022   Procedure: BRONCHIAL NEEDLE ASPIRATION BIOPSIES;  Surgeon: Raechel Chute, MD;  Location: [facility];  Service: Pulmonary;;   CESAREAN SECTION     CHOLECYSTECTOMY     COLONOSCOPY WITH PROPOFOL N/A 03/21/2021   Procedure: COLONOSCOPY WITH PROPOFOL;  Surgeon: Toney Reil, MD;  Location: [facility] ENDOSCOPY;  Service: Gastroenterology;  Laterality: N/A;   IR IMAGING GUIDED PORT INSERTION  06/21/2022   OVARY SURGERY     VIDEO BRONCHOSCOPY WITH ENDOBRONCHIAL ULTRASOUND N/A 06/07/2022   Procedure: VIDEO BRONCHOSCOPY WITH ENDOBRONCHIAL ULTRASOUND;  Surgeon: Raechel Chute, MD;  Location: [facility];  Service: Pulmonary;  Laterality: N/A;    HEMATOLOGY/ONCOLOGY HISTORY:  Oncology History  Adenocarcinoma of right lung (HCC)  06/12/2022 Initial Diagnosis   Adenocarcinoma of right lung (HCC)   06/12/2022 Cancer Staging   Staging form: Lung, AJCC 8th Edition - Clinical stage from 06/12/2022: Stage IIIB (cT2b, cN3, cM0) - Signed by Jeralyn Ruths, MD on 06/12/2022 Stage prefix: Initial diagnosis   07/03/2022 - 07/31/2022 Chemotherapy   Patient is on Treatment Plan : LUNG Carboplatin + Paclitaxel + XRT q7d     08/14/2022 -  Chemotherapy   Patient is on Treatment Plan : LUNG Durvalumab (10) q14d       ALLERGIES:  is allergic to penicillins.  MEDICATIONS:  Current Outpatient Medications  Medication Sig Dispense Refill   ALPRAZolam (XANAX) 0.25 MG tablet Take 1 tablet (0.25 mg total) by mouth at bedtime as needed for anxiety. 30 tablet 0   aspirin EC 81 MG tablet Take 81 mg by mouth in the morning.     calcium carbonate (OS-CAL) 600 MG TABS Take 600 mg by mouth every evening.     Coenzyme Q10 (CO Q 10 PO) Take 1 capsule by mouth in the morning.     Fluocinolone Acetonide 0.01 % OIL Apply twice daily to ears as needed for rash/itching 20  mL 2   ibuprofen (ADVIL,MOTRIN) 200 MG tablet Take 400 mg by mouth every 8 (eight) hours as needed (pain.).     ketoconazole (NIZORAL) 2 % shampoo 2-3 times per week lather on scalp and ears, leave on 8-10 minutes, rinse well (Patient taking differently: Apply 1 Application topically once a week. Once per week lather on scalp and ears, leave on 8-10 minutes, rinse well) 120 mL 5   lidocaine-prilocaine (EMLA) cream Apply 1 Application  topically as needed.     magic mouthwash SOLN Take 10 mLs by mouth 3 (three) times daily as needed. 300 mL 0   MAGNESIUM PO Take 1 tablet by mouth every evening.     OMEGA-3 FATTY ACIDS PO Take 1 g by mouth every evening.     simvastatin (ZOCOR) 40 MG tablet TAKE 1 TABLET BY MOUTH EVERY DAY 90 tablet 3   VITAMIN D PO Take 1,000 Units by mouth in the morning.     sucralfate (CARAFATE) 1 g tablet Take 1 tablet (1 g total) by mouth 3 (three) times daily. Dissolve tablet in 4 tablespoons warm water swish and swallow. (Patient not taking: Reported on 07/17/2022) 90 tablet 1   No current facility-administered medications for this visit.   Facility-Administered Medications Ordered in Other Visits  Medication Dose Route Frequency Provider Last Rate Last Admin   0.9 %  sodium chloride infusion   Intravenous Continuous Rushie Chestnut, [place] 999 mL/hr at 08/16/22 1409 New Bag at 08/16/22 1409   heparin lock flush 100 unit/mL  500 Units Intracatheter Once Dariel Betzer M, PA-C        VITAL SIGNS: BP (!) 125/90 (BP Location: Left Arm, Patient Position: Sitting)   Pulse (!) 115   Temp 97.9 F (36.6 C) (Tympanic)   Wt 163 lb (73.9 kg)   SpO2 98%   BMI 26.31 kg/m  Filed Weights   08/16/22 1339  Weight: 163 lb (73.9 kg)    Estimated body mass index is 26.31 kg/m as calculated from the following:   Height as of 08/14/22: 5\' 6"  (1.676 m).   Weight as of this encounter: 163 lb (73.9 kg).  LABS: CBC:    Component Value Date/Time   WBC 1.9 (L) 08/16/2022 1313   HGB 10.5 (L) 08/16/2022 1313   HGB 10.8 (L) 08/14/2022 0906   HCT 30.7 (L) 08/16/2022 1313   PLT 162 08/16/2022 1313   PLT 150 08/14/2022 0906   MCV 86.5 08/16/2022 1313   NEUTROABS 1.0 (L) 08/16/2022 1313   LYMPHSABS 0.7 08/16/2022 1313   MONOABS 0.2 08/16/2022 1313   EOSABS 0.0 08/16/2022 1313   BASOSABS 0.0 08/16/2022 1313   Comprehensive Metabolic Panel:    Component Value Date/Time   NA 132 (L) 08/16/2022 1313   K 3.3  (L) 08/16/2022 1313   CL 100 08/16/2022 1313   CO2 21 (L) 08/16/2022 1313   BUN 14 08/16/2022 1313   CREATININE 0.76 08/16/2022 1313   GLUCOSE 158 (H) 08/16/2022 1313   CALCIUM 8.8 (L) 08/16/2022 1313   AST 28 08/16/2022 1313   ALT 23 08/16/2022 1313   ALKPHOS 48 08/16/2022 1313   BILITOT 0.7 08/16/2022 1313   PROT 7.0 08/16/2022 1313   ALBUMIN 3.5 08/16/2022 1313    PERFORMANCE STATUS (ECOG) : 1 - Symptomatic but completely ambulatory  Review of Systems Unless otherwise noted, a complete review of systems is negative.  Physical Exam General: NAD Cardiovascular: regular rate and rhythm Pulmonary: clear ant fields Abdomen: soft, nontender, +  bowel sounds Extremities: no edema, no joint deformities Skin: no rashes Neurological: Weakness but otherwise nonfocal  Assessment and Plan- Patient is a 67 y.o. female    Encounter Diagnoses  Name Primary?   Adenocarcinoma of right lung (HCC) Yes   Fever, unspecified fever cause    Neutropenia, unspecified type (HCC)     Wide differential. Viral most likely. Afebrile in office. I asked that she consider taking a home COVID-19 test and ensure that anti virals are not needed. Given that it is the weekend I will send in a prophylactic ABX given her stable neutropenia for her to start if she does not start to feel better with rest, hydration and electrolyte drinks. Discussed red flag signs and symptoms. Follow up labs on Monday. IVF fluids for support today. She will also increase oral potassium intake.    Patient expressed understanding and was in agreement with this plan. She also understands that She can call clinic at any time with any questions, concerns, or complaints.   Thank you for allowing me to participate in the care of this very pleasant patient.   Time Total: 25  Visit consisted of counseling and education dealing with the complex and emotionally intense issues of symptom management in the setting of serious  illness.Greater than 50%  of this time was spent counseling and coordinating care related to the above assessment and plan.  Signed by: Clent Jacks, PA-C

## 2022-08-18 ENCOUNTER — Other Ambulatory Visit: Payer: Self-pay

## 2022-08-18 LAB — CULTURE, BLOOD (ROUTINE X 2)
Culture: NO GROWTH
Special Requests: ADEQUATE

## 2022-08-19 ENCOUNTER — Ambulatory Visit: Payer: Medicare Other

## 2022-08-19 LAB — CULTURE, BLOOD (ROUTINE X 2): Special Requests: ADEQUATE

## 2022-08-20 ENCOUNTER — Ambulatory Visit: Payer: Medicare Other

## 2022-08-20 LAB — CULTURE, BLOOD (ROUTINE X 2)

## 2022-08-21 ENCOUNTER — Encounter: Payer: Medicare Other | Admitting: Medical Oncology

## 2022-08-21 ENCOUNTER — Ambulatory Visit: Payer: Medicare Other

## 2022-08-21 ENCOUNTER — Other Ambulatory Visit: Payer: Medicare Other

## 2022-08-21 LAB — CULTURE, BLOOD (ROUTINE X 2): Culture: NO GROWTH

## 2022-08-28 ENCOUNTER — Encounter: Payer: Self-pay | Admitting: Oncology

## 2022-08-28 ENCOUNTER — Inpatient Hospital Stay: Payer: Medicare Other

## 2022-08-28 ENCOUNTER — Inpatient Hospital Stay: Payer: Medicare Other | Admitting: Oncology

## 2022-08-28 VITALS — HR 102

## 2022-08-28 VITALS — BP 120/81 | HR 105 | Temp 97.0°F | Resp 16 | Ht 66.0 in | Wt 162.5 lb

## 2022-08-28 DIAGNOSIS — G629 Polyneuropathy, unspecified: Secondary | ICD-10-CM | POA: Diagnosis not present

## 2022-08-28 DIAGNOSIS — Z79899 Other long term (current) drug therapy: Secondary | ICD-10-CM | POA: Diagnosis not present

## 2022-08-28 DIAGNOSIS — Z79620 Long term (current) use of immunosuppressive biologic: Secondary | ICD-10-CM | POA: Diagnosis not present

## 2022-08-28 DIAGNOSIS — C3491 Malignant neoplasm of unspecified part of right bronchus or lung: Secondary | ICD-10-CM

## 2022-08-28 DIAGNOSIS — G4700 Insomnia, unspecified: Secondary | ICD-10-CM | POA: Diagnosis not present

## 2022-08-28 DIAGNOSIS — D709 Neutropenia, unspecified: Secondary | ICD-10-CM | POA: Diagnosis not present

## 2022-08-28 DIAGNOSIS — R1310 Dysphagia, unspecified: Secondary | ICD-10-CM | POA: Diagnosis not present

## 2022-08-28 DIAGNOSIS — D649 Anemia, unspecified: Secondary | ICD-10-CM | POA: Diagnosis not present

## 2022-08-28 DIAGNOSIS — C3411 Malignant neoplasm of upper lobe, right bronchus or lung: Secondary | ICD-10-CM | POA: Diagnosis not present

## 2022-08-28 DIAGNOSIS — E871 Hypo-osmolality and hyponatremia: Secondary | ICD-10-CM | POA: Diagnosis not present

## 2022-08-28 DIAGNOSIS — D72819 Decreased white blood cell count, unspecified: Secondary | ICD-10-CM | POA: Diagnosis not present

## 2022-08-28 DIAGNOSIS — Z5112 Encounter for antineoplastic immunotherapy: Secondary | ICD-10-CM | POA: Diagnosis not present

## 2022-08-28 LAB — CMP (CANCER CENTER ONLY)
ALT: 17 U/L (ref 0–44)
AST: 27 U/L (ref 15–41)
Albumin: 3.5 g/dL (ref 3.5–5.0)
Alkaline Phosphatase: 56 U/L (ref 38–126)
Anion gap: 8 (ref 5–15)
BUN: 13 mg/dL (ref 8–23)
CO2: 23 mmol/L (ref 22–32)
Calcium: 8.8 mg/dL — ABNORMAL LOW (ref 8.9–10.3)
Chloride: 102 mmol/L (ref 98–111)
Creatinine: 0.76 mg/dL (ref 0.44–1.00)
GFR, Estimated: >60 mL/min (ref >60)
Glucose, Bld: 139 mg/dL — ABNORMAL HIGH (ref 70–99)
Potassium: 3.7 mmol/L (ref 3.5–5.1)
Sodium: 133 mmol/L — ABNORMAL LOW (ref 135–145)
Total Bilirubin: 0.3 mg/dL (ref 0.3–1.2)
Total Protein: 7.2 g/dL (ref 6.5–8.1)

## 2022-08-28 LAB — CBC WITH DIFFERENTIAL (CANCER CENTER ONLY)
Abs Immature Granulocytes: 0.01 10*3/uL (ref 0.00–0.07)
Basophils Absolute: 0 10*3/uL (ref 0.0–0.1)
Basophils Relative: 1 %
Eosinophils Absolute: 0 10*3/uL (ref 0.0–0.5)
Eosinophils Relative: 1 %
HCT: 30.8 % — ABNORMAL LOW (ref 36.0–46.0)
Hemoglobin: 10.2 g/dL — ABNORMAL LOW (ref 12.0–15.0)
Immature Granulocytes: 0 %
Lymphocytes Relative: 37 %
Lymphs Abs: 1 10*3/uL (ref 0.7–4.0)
MCH: 30.2 pg (ref 26.0–34.0)
MCHC: 33.1 g/dL (ref 30.0–36.0)
MCV: 91.1 fL (ref 80.0–100.0)
Monocytes Absolute: 0.5 10*3/uL (ref 0.1–1.0)
Monocytes Relative: 18 %
Neutro Abs: 1.1 10*3/uL — ABNORMAL LOW (ref 1.7–7.7)
Neutrophils Relative %: 43 %
Platelet Count: 373 10*3/uL (ref 150–400)
RBC: 3.38 MIL/uL — ABNORMAL LOW (ref 3.87–5.11)
RDW: 18.5 % — ABNORMAL HIGH (ref 11.5–15.5)
WBC Count: 2.6 10*3/uL — ABNORMAL LOW (ref 4.0–10.5)
nRBC: 0 % (ref 0.0–0.2)

## 2022-08-28 MED ORDER — SODIUM CHLORIDE 0.9 % IV SOLN
Freq: Once | INTRAVENOUS | Status: AC
  Filled 2022-08-28: qty 250

## 2022-08-28 MED ORDER — SODIUM CHLORIDE 0.9 % IV SOLN
10.0000 mg/kg | Freq: Once | INTRAVENOUS | Status: AC
  Administered 2022-08-28: 740 mg via INTRAVENOUS
  Filled 2022-08-28: qty 4.8

## 2022-08-28 MED ORDER — HEPARIN SOD (PORK) LOCK FLUSH 100 UNIT/ML IV SOLN
500.0000 [IU] | Freq: Once | INTRAVENOUS | Status: AC | PRN
  Administered 2022-08-28: 500 [IU]
  Filled 2022-08-28: qty 5

## 2022-08-28 MED ORDER — SODIUM CHLORIDE 0.9% FLUSH
10.0000 mL | Freq: Once | INTRAVENOUS | Status: AC
  Administered 2022-08-28: 10 mL via INTRAVENOUS
  Filled 2022-08-28: qty 10

## 2022-08-28 MED ORDER — HEPARIN SOD (PORK) LOCK FLUSH 100 UNIT/ML IV SOLN
500.0000 [IU] | Freq: Once | INTRAVENOUS | Status: DC
  Filled 2022-08-28: qty 5

## 2022-08-28 NOTE — Progress Notes (Signed)
Having pain in the bottom of feet that she noticed a few days ago. Describes it as an ache verses a burning or pins and needles pain.

## 2022-08-28 NOTE — Progress Notes (Signed)
[facility]  Telephone:([phone] Fax:(336) [phone]  ID: Connie West OB: 07-Jul-1955  MR#: [id number]  AOZ#:[id number]  Patient Care Team: Excell Seltzer, MD as PCP - General Glory Buff, RN as Oncology Nurse Navigator Orlie Dakin, Tollie Pizza, MD as Consulting Physician (Oncology)  CHIEF COMPLAINT: Stage IIIb adenocarcinoma of the lung.  INTERVAL HISTORY: Patient returns to clinic today for further evaluation and consideration of cycle 2 of maintenance durvalumab.  She is tolerating her treatments well without significant side effects.  She has mild weakness and fatigue, but states this is improving.  She has a mild peripheral neuropathy, that does not affect her day-to-day activity.  She has no other neurologic complaints.  She denies any recent fevers or illnesses.  She has a good appetite and denies weight loss.  She has no chest pain, shortness of breath, cough, or hemoptysis.  She denies any nausea, vomiting, constipation, or diarrhea.  She has no urinary complaints.  Patient offers no further specific complaints today.  REVIEW OF SYSTEMS:   Review of Systems  Constitutional:  Positive for malaise/fatigue. Negative for fever and weight loss.  Respiratory: Negative.  Negative for cough, hemoptysis and shortness of breath.   Cardiovascular: Negative.  Negative for chest pain and leg swelling.  Gastrointestinal: Negative.  Negative for abdominal pain.  Genitourinary: Negative.  Negative for dysuria.  Musculoskeletal: Negative.  Negative for back pain.  Skin: Negative.  Negative for rash.  Neurological:  Positive for tingling, sensory change and weakness. Negative for dizziness, focal weakness and headaches.  Psychiatric/Behavioral: Negative.  The patient is not nervous/anxious.     As per HPI. Otherwise, a complete review of systems is negative.  PAST MEDICAL HISTORY: Past Medical History:  Diagnosis Date   Complication of anesthesia    Hyperlipidemia    PONV  (postoperative nausea and vomiting)    TIA (transient ischemic attack)     PAST SURGICAL HISTORY: Past Surgical History:  Procedure Laterality Date   BREAST CYST ASPIRATION Right 07/20/2012   FNA benign   BREAST CYST ASPIRATION Right    BREAST SURGERY Right 07-20-12   FNA benign   BRONCHIAL NEEDLE ASPIRATION BIOPSY  06/07/2022   Procedure: BRONCHIAL NEEDLE ASPIRATION BIOPSIES;  Surgeon: Raechel Chute, MD;  [facility];  Service: Pulmonary;;   CESAREAN SECTION     CHOLECYSTECTOMY     COLONOSCOPY WITH PROPOFOL N/A 03/21/2021   Procedure: COLONOSCOPY WITH PROPOFOL;  Surgeon: Toney Reil, MD;  Location: [facility] END[facility]COPY;  Service: Gastroenterology;  Laterality: N/A;   IR IMAGING GUIDED PORT INSERTION  06/21/2022   OVARY SURGERY     VIDEO BRONCHOSCOPY WITH ENDOBRONCHIAL ULTRASOUND N/A 06/07/2022   Procedure: VIDEO BRONCHOSCOPY WITH ENDOBRONCHIAL ULTRASOUND;  Surgeon: Raechel Chute, MD;  Location: [facility];  Service: Pulmonary;  Laterality: N/A;    FAMILY HISTORY: Family History  Problem Relation Age of Onset   Breast cancer Mother 78   Cancer Mother        breast   Cancer Father        bone cancer    ADVANCED DIRECTIVES (Y/N):  N  HEALTH MAINTENANCE: Social History   Tobacco Use   Smoking status: Former    Current packs/day: 0.00    Average packs/day: 0.6 packs/day for 43.0 years (26.7 ttl pk-yrs)    Types: Cigarettes    Start date: 36    Quit date: 2016    Years since quitting: 8.5   Smokeless tobacco: Never   Tobacco comments:  quit x 1 month 11/16  Vaping Use   Vaping status: Never Used  Substance Use Topics   Alcohol use: Yes    Comment: occasional : 1x/week   Drug use: No     Colonoscopy:  PAP:  Bone density:  Lipid panel:  Allergies  Allergen Reactions   Penicillins Hives    Current Outpatient Medications  Medication Sig Dispense Refill   ALPRAZolam (XANAX) 0.25 MG tablet Take 1 tablet (0.25 mg total) by mouth at bedtime as  needed for anxiety. 30 tablet 0   aspirin EC 81 MG tablet Take 81 mg by mouth in the morning.     calcium carbonate (OS-CAL) 600 MG TABS Take 600 mg by mouth every evening.     Coenzyme Q10 (CO Q 10 PO) Take 1 capsule by mouth in the morning.     Fluocinolone Acetonide 0.01 % OIL Apply twice daily to ears as needed for rash/itching 20 mL 2   ibuprofen (ADVIL,MOTRIN) 200 MG tablet Take 400 mg by mouth every 8 (eight) hours as needed (pain.).     ketoconazole (NIZORAL) 2 % shampoo 2-3 times per week lather on scalp and ears, leave on 8-10 minutes, rinse well (Patient taking differently: Apply 1 Application topically once a week. Once per week lather on scalp and ears, leave on 8-10 minutes, rinse well) 120 mL 5   lidocaine-prilocaine (EMLA) cream Apply 1 Application topically as needed.     magic mouthwash SOLN Take 10 mLs by mouth 3 (three) times daily as needed. 300 mL 0   MAGNESIUM PO Take 1 tablet by mouth every evening.     OMEGA-3 FATTY ACIDS PO Take 1 g by mouth every evening.     simvastatin (ZOCOR) 40 MG tablet TAKE 1 TABLET BY MOUTH EVERY DAY 90 tablet 3   VITAMIN D PO Take 1,000 Units by mouth in the morning.     sucralfate (CARAFATE) 1 g tablet Take 1 tablet (1 g total) by mouth 3 (three) times daily. Dissolve tablet in 4 tablespoons warm water swish and swallow. (Patient not taking: Reported on 07/17/2022) 90 tablet 1   No current facility-administered medications for this visit.   Facility-Administered Medications Ordered in Other Visits  Medication Dose Route Frequency Provider Last Rate Last Admin   durvalumab (IMFINZI) 740 mg in sodium chloride 0.9 % 100 mL chemo infusion  10 mg/kg (Treatment Plan Recorded) Intravenous Once Jeralyn Ruths, MD 115 mL/hr at 08/28/22 1033 740 mg at 08/28/22 1033   heparin lock flush 100 unit/mL  500 Units Intravenous Once Jeralyn Ruths, MD       heparin lock flush 100 unit/mL  500 Units Intracatheter Once PRN Jeralyn Ruths, MD         OBJECTIVE: Vitals:   08/28/22 0858  BP: 120/81  Pulse: (!) 105  Resp: 16  Temp: (!) 97 F (36.1 C)  SpO2: 99%     Body mass index is 26.23 kg/m.    ECOG FS:0 - Asymptomatic  General: Well-developed, well-nourished, no acute distress. Eyes: Pink conjunctiva, anicteric sclera. HEENT: Normocephalic, moist mucous membranes. Lungs: No audible wheezing or coughing. Heart: Regular rate and rhythm. Abdomen: Soft, nontender, no obvious distention. Musculoskeletal: No edema, cyanosis, or clubbing. Neuro: Alert, answering all questions appropriately. Cranial nerves grossly intact. Skin: No rashes or petechiae noted. Psych: Normal affect.  LAB RESULTS:  Lab Results  Component Value Date   NA 133 (L) 08/28/2022   K 3.7 08/28/2022   CL 102  08/28/2022   CO2 23 08/28/2022   GLUCOSE 139 (H) 08/28/2022   BUN 13 08/28/2022   CREATININE 0.76 08/28/2022   CALCIUM 8.8 (L) 08/28/2022   PROT 7.2 08/28/2022   ALBUMIN 3.5 08/28/2022   AST 27 08/28/2022   ALT 17 08/28/2022   ALKPHOS 56 08/28/2022   BILITOT 0.3 08/28/2022   GFRNONAA >60 08/28/2022   GFRAA >60 01/04/2018    Lab Results  Component Value Date   WBC 2.6 (L) 08/28/2022   NEUTROABS 1.1 (L) 08/28/2022   HGB 10.2 (L) 08/28/2022   HCT 30.8 (L) 08/28/2022   MCV 91.1 08/28/2022   PLT 373 08/28/2022     STUDIES: No results found.  ASSESSMENT: Stage IIIb adenocarcinoma of the lung.  PLAN:    Stage IIIb adenocarcinoma of the lung: Biopsy from bronchoscopy on June 07, 2022 confirming the diagnosis.  PET scan results from May 22, 2022 reviewed independently confirming stage of disease.  The bilateral hypermetabolic cervical lymph nodes are suspicious, but will treat patient as a stage IIIb.  MRI of the brain on Jun 19, 2022 did not reveal any metastatic disease.  Patient completed weekly carboplatin and Taxol on July 31, 2022.  Patient now receiving maintenance durvalumab every 2 weeks for 1 year.  She initiated on August 14, 2022.  Proceed with cycle 2 of treatment today.  Return to clinic in 2 weeks for treatment only and then in 4 weeks for further evaluation and continuation of treatment.   Peripheral neuropathy: Mild, monitor. Hyponatremia: Chronic and unchanged. Leukopenia: Patient's white blood cell count has trended up to 2.6.  Monitor. Anemia: Chronic and unchanged.  Patient's hemoglobin is 10.2. Insomnia: Continue Xanax as needed.   Patient expressed understanding and was in agreement with this plan. She also understands that She can call clinic at any time with any questions, concerns, or complaints.    Cancer Staging  Adenocarcinoma of right lung [facility]) Staging form: Lung, AJCC 8th Edition - Clinical stage from 06/12/2022: Stage IIIB (cT2b, cN3, cM0) - Signed by Jeralyn Ruths, MD on 06/12/2022 Stage prefix: Initial diagnosis   Jeralyn Ruths, MD   08/28/2022 10:36 AM

## 2022-09-09 ENCOUNTER — Ambulatory Visit
Admission: RE | Admit: 2022-09-09 | Discharge: 2022-09-09 | Disposition: A | Discharge status: Home / Self Care | Payer: Medicare Other | Source: Ambulatory Visit | Attending: Radiation Oncology | Admitting: Radiation Oncology

## 2022-09-09 ENCOUNTER — Encounter: Payer: Self-pay | Admitting: Radiation Oncology

## 2022-09-09 ENCOUNTER — Encounter: Payer: Self-pay | Admitting: Oncology

## 2022-09-09 ENCOUNTER — Other Ambulatory Visit: Payer: Self-pay | Admitting: *Deleted

## 2022-09-09 VITALS — BP 125/90 | HR 110 | Temp 97.4°F | Resp 20 | Wt 162.0 lb

## 2022-09-09 DIAGNOSIS — C3491 Malignant neoplasm of unspecified part of right bronchus or lung: Secondary | ICD-10-CM | POA: Diagnosis not present

## 2022-09-09 NOTE — Progress Notes (Signed)
Radiation Oncology Follow up Note  Name: ROBINETTE KARNES   Date:   09/09/2022 MRN:  [id number] DOB: March 29, 1955    This 67 y.o. female presents to the clinic today for reevaluation after prolonged time to undergo treatment for stage IIIb (T2b N3 M0) adenocarcinoma the right lung.  REFERRING PROVIDER: Excell Seltzer, MD  HPI: Patient is a 67 year old female we simulated initially for radiation to her right lung for locally advanced stage IIIb adenocarcinoma.  She was too weak to tolerate chemotherapy.  She is currently on maintenance Durvalumab.  Patient developed some fever she has not been treated in over 2 weeks and never started her radiation treatments.  Seen today for reevaluation.  She is frail weak. COMPLICATIONS OF TREATMENT: none  FOLLOW UP COMPLIANCE: keeps appointments   PHYSICAL EXAM:  BP (!) 125/90   Pulse (!) 110   Temp (!) 97.4 F (36.3 C)   Resp 20   Wt 162 lb (73.5 kg)   BMI 26.15 kg/m  [place] female in NAD wheelchair-bound.  She has decreased breath sounds in her right chest.  No evidence of venous jugular distention.  Abdomen is benign.  RADIOLOGY RESULTS: Previous CT scans reviewed.  PLAN: Disliked time would like to continue with palliative radiation therapy treatment of 40 Gray in 20 fractions to her right lung.  We will resimulate her since it has been over 3 to 4 weeks since her prior simulation.  We will get her under treatment by early next week.  Patient comprehends her recommendations well.  I would like to take this opportunity to thank you for allowing me to participate in the care of your patient.[name], MD

## 2022-09-09 NOTE — Addendum Note (Signed)
Encounter addended by: [name], MD on: 09/09/2022 11:58 AM  Actions taken: Clinical Note Signed, Level of Service modified

## 2022-09-09 NOTE — Progress Notes (Signed)
Radiation Oncology Follow up Note  Name: Connie West   Date:   09/09/2022 MRN:  [id number] DOB: Feb 11, 1956    This 67 y.o. female presents to the clinic today for 1 month follow-up status post.  Concurrent chemoradiation therapy for stage IIIb adenocarcinoma the right lung  REFERRING PROVIDER: Excell Seltzer, MD  HPI: Patient is a 67 year old female now out 1 month having completed concurrent chemoradiation therapy for stage IIIb adenocarcinoma of the right lung.  Seen today in routine follow-up she is doing well her wheezing has subsided she specifically [place] cough hemoptysis or chest tightness.  She is having no pain..  COMPLICATIONS OF TREATMENT: none  FOLLOW UP COMPLIANCE: keeps appointments   PHYSICAL EXAM:  BP (!) 125/90   Pulse (!) 110   Temp (!) 97.4 F (36.3 C)   Resp 20   Wt 162 lb (73.5 kg)   BMI 26.15 kg/m  Well-developed well-nourished patient in NAD. HEENT reveals PERLA, EOMI, discs not visualized.  Oral cavity is clear. No oral mucosal lesions are identified. Neck is clear without evidence of cervical or supraclavicular adenopathy. Lungs are clear to A&P. Cardiac examination is essentially unremarkable with regular rate and rhythm without murmur rub or thrill. Abdomen is benign with no organomegaly or masses noted. Motor sensory and DTR levels are equal and symmetric in the upper and lower extremities. Cranial nerves II through XII are grossly intact. Proprioception is intact. No peripheral adenopathy or edema is identified. No motor or sensory levels are noted. Crude visual fields are within normal range.  RADIOLOGY RESULTS: CT scan ordered in 3 months  PLAN: Present time patient is doing well 1 month out from concurrent chemoradiation.  And pleased with her overall progress.  I have asked to see her back in 4 months after her CT scan has been performed.  Patient is to call with any concerns.  I would like to take this opportunity to thank you for allowing me to  participate in the care of your patient.[name], MD

## 2022-09-10 ENCOUNTER — Other Ambulatory Visit: Payer: Self-pay

## 2022-09-11 ENCOUNTER — Inpatient Hospital Stay: Payer: Medicare Other

## 2022-09-11 ENCOUNTER — Other Ambulatory Visit: Payer: Medicare Other

## 2022-09-11 ENCOUNTER — Ambulatory Visit: Payer: Medicare Other | Admitting: Oncology

## 2022-09-11 VITALS — BP 139/83 | HR 98 | Temp 98.1°F | Resp 18 | Wt 161.3 lb

## 2022-09-11 DIAGNOSIS — D72819 Decreased white blood cell count, unspecified: Secondary | ICD-10-CM | POA: Diagnosis not present

## 2022-09-11 DIAGNOSIS — D709 Neutropenia, unspecified: Secondary | ICD-10-CM | POA: Diagnosis not present

## 2022-09-11 DIAGNOSIS — R1310 Dysphagia, unspecified: Secondary | ICD-10-CM | POA: Diagnosis not present

## 2022-09-11 DIAGNOSIS — Z79620 Long term (current) use of immunosuppressive biologic: Secondary | ICD-10-CM | POA: Diagnosis not present

## 2022-09-11 DIAGNOSIS — G629 Polyneuropathy, unspecified: Secondary | ICD-10-CM | POA: Diagnosis not present

## 2022-09-11 DIAGNOSIS — C3411 Malignant neoplasm of upper lobe, right bronchus or lung: Secondary | ICD-10-CM | POA: Diagnosis not present

## 2022-09-11 DIAGNOSIS — D649 Anemia, unspecified: Secondary | ICD-10-CM | POA: Diagnosis not present

## 2022-09-11 DIAGNOSIS — Z5112 Encounter for antineoplastic immunotherapy: Secondary | ICD-10-CM | POA: Diagnosis not present

## 2022-09-11 DIAGNOSIS — C3491 Malignant neoplasm of unspecified part of right bronchus or lung: Secondary | ICD-10-CM

## 2022-09-11 DIAGNOSIS — G4700 Insomnia, unspecified: Secondary | ICD-10-CM | POA: Diagnosis not present

## 2022-09-11 DIAGNOSIS — E871 Hypo-osmolality and hyponatremia: Secondary | ICD-10-CM | POA: Diagnosis not present

## 2022-09-11 DIAGNOSIS — Z79899 Other long term (current) drug therapy: Secondary | ICD-10-CM | POA: Diagnosis not present

## 2022-09-11 MED ORDER — SODIUM CHLORIDE 0.9 % IV SOLN
10.0000 mg/kg | Freq: Once | INTRAVENOUS | Status: AC
  Administered 2022-09-11: 740 mg via INTRAVENOUS
  Filled 2022-09-11: qty 10

## 2022-09-11 MED ORDER — SODIUM CHLORIDE 0.9 % IV SOLN
Freq: Once | INTRAVENOUS | Status: AC
  Filled 2022-09-11: qty 250

## 2022-09-11 NOTE — Patient Instructions (Signed)
[facility] [place] [facility]  Discharge Instructions: Thank you for choosing [facility] to provide your oncology and hematology care.  If you have a lab appointment with the Cancer Center, please go directly to the Cancer Center and check in at the registration area.  Wear comfortable clothing and clothing appropriate for easy access to any Portacath or PICC line.   We strive to give you quality time with your provider. You may need to reschedule your appointment if you arrive late (15 or more minutes).  Arriving late affects you and other patients whose appointments are after yours.  Also, if you miss three or more appointments without notifying the office, you may be dismissed from the clinic at the provider's discretion.      For prescription refill requests, have your pharmacy contact our office and allow 72 hours for refills to be completed.    Today you received the following chemotherapy and/or immunotherapy agents Imfinzi        To help prevent nausea and vomiting after your treatment, we encourage you to take your nausea medication as directed.  BELOW ARE SYMPTOMS THAT SHOULD BE REPORTED IMMEDIATELY: *FEVER GREATER THAN 100.4 F (38 C) OR HIGHER *CHILLS OR SWEATING *NAUSEA AND VOMITING THAT IS NOT CONTROLLED WITH YOUR NAUSEA MEDICATION *UNUSUAL SHORTNESS OF BREATH *UNUSUAL BRUISING OR BLEEDING *URINARY PROBLEMS (pain or burning when urinating, or frequent urination) *BOWEL PROBLEMS (unusual diarrhea, constipation, pain near the anus) TENDERNESS IN MOUTH AND THROAT WITH OR WITHOUT PRESENCE OF ULCERS (sore throat, sores in mouth, or a toothache) UNUSUAL RASH, SWELLING OR PAIN  UNUSUAL VAGINAL DISCHARGE OR ITCHING   Items with * indicate a potential emergency and should be followed up as soon as possible or go to the Emergency Department if any problems should occur.  Please show the CHEMOTHERAPY ALERT CARD or IMMUNOTHERAPY ALERT CARD at check-in to  the Emergency Department and triage nurse.  Should you have questions after your visit or need to cancel or reschedule your appointment, please contact [facility] [place] [facility]  [phone] and follow the prompts.  Office hours are 8:00 a.m. to 4:30 p.m. Monday - Friday. Please note that voicemails left after 4:00 p.m. may not be returned until the following business day.  We are closed weekends and major holidays. You have access to a nurse at all times for urgent questions. Please call the main number to the clinic [phone] and follow the prompts.  For any non-urgent questions, you may also contact your provider using MyChart. We now offer e-Visits for anyone 18 and older to request care online for non-urgent symptoms. For details visit [email or web address].   Also download the MyChart app! Go to the app store, search "MyChart", open the app, select [place], and log in with your MyChart username and password.    

## 2022-09-25 ENCOUNTER — Inpatient Hospital Stay (HOSPITAL_BASED_OUTPATIENT_CLINIC_OR_DEPARTMENT_OTHER): Payer: Medicare Other | Admitting: Oncology

## 2022-09-25 ENCOUNTER — Inpatient Hospital Stay: Payer: Medicare Other | Attending: Oncology

## 2022-09-25 ENCOUNTER — Inpatient Hospital Stay: Payer: Medicare Other

## 2022-09-25 ENCOUNTER — Encounter: Payer: Self-pay | Admitting: Oncology

## 2022-09-25 DIAGNOSIS — Z5112 Encounter for antineoplastic immunotherapy: Secondary | ICD-10-CM | POA: Insufficient documentation

## 2022-09-25 DIAGNOSIS — C3491 Malignant neoplasm of unspecified part of right bronchus or lung: Secondary | ICD-10-CM

## 2022-09-25 DIAGNOSIS — E871 Hypo-osmolality and hyponatremia: Secondary | ICD-10-CM | POA: Insufficient documentation

## 2022-09-25 DIAGNOSIS — D649 Anemia, unspecified: Secondary | ICD-10-CM | POA: Diagnosis not present

## 2022-09-25 DIAGNOSIS — C3411 Malignant neoplasm of upper lobe, right bronchus or lung: Secondary | ICD-10-CM | POA: Insufficient documentation

## 2022-09-25 DIAGNOSIS — G629 Polyneuropathy, unspecified: Secondary | ICD-10-CM | POA: Insufficient documentation

## 2022-09-25 LAB — CMP (CANCER CENTER ONLY)
ALT: 13 U/L (ref 0–44)
AST: 23 U/L (ref 15–41)
Albumin: 3.5 g/dL (ref 3.5–5.0)
Alkaline Phosphatase: 64 U/L (ref 38–126)
Anion gap: 9 (ref 5–15)
BUN: 14 mg/dL (ref 8–23)
CO2: 22 mmol/L (ref 22–32)
Calcium: 9.1 mg/dL (ref 8.9–10.3)
Chloride: 103 mmol/L (ref 98–111)
Creatinine: 0.79 mg/dL (ref 0.44–1.00)
GFR, Estimated: >60 mL/min (ref >60)
Glucose, Bld: 123 mg/dL — ABNORMAL HIGH (ref 70–99)
Potassium: 4 mmol/L (ref 3.5–5.1)
Sodium: 134 mmol/L — ABNORMAL LOW (ref 135–145)
Total Bilirubin: 0.4 mg/dL (ref 0.3–1.2)
Total Protein: 7.4 g/dL (ref 6.5–8.1)

## 2022-09-25 LAB — CBC WITH DIFFERENTIAL (CANCER CENTER ONLY)
Abs Immature Granulocytes: 0.01 10*3/uL (ref 0.00–0.07)
Basophils Absolute: 0 10*3/uL (ref 0.0–0.1)
Basophils Relative: 0 %
Eosinophils Absolute: 0.1 10*3/uL (ref 0.0–0.5)
Eosinophils Relative: 1 %
HCT: 33.4 % — ABNORMAL LOW (ref 36.0–46.0)
Hemoglobin: 10.7 g/dL — ABNORMAL LOW (ref 12.0–15.0)
Immature Granulocytes: 0 %
Lymphocytes Relative: 15 %
Lymphs Abs: 0.9 10*3/uL (ref 0.7–4.0)
MCH: 29.6 pg (ref 26.0–34.0)
MCHC: 32 g/dL (ref 30.0–36.0)
MCV: 92.3 fL (ref 80.0–100.0)
Monocytes Absolute: 0.7 10*3/uL (ref 0.1–1.0)
Monocytes Relative: 11 %
Neutro Abs: 4.7 10*3/uL (ref 1.7–7.7)
Neutrophils Relative %: 73 %
Platelet Count: 400 10*3/uL (ref 150–400)
RBC: 3.62 MIL/uL — ABNORMAL LOW (ref 3.87–5.11)
RDW: 14.4 % (ref 11.5–15.5)
WBC Count: 6.5 10*3/uL (ref 4.0–10.5)
nRBC: 0 % (ref 0.0–0.2)

## 2022-09-25 MED ORDER — SODIUM CHLORIDE 0.9% FLUSH
10.0000 mL | Freq: Once | INTRAVENOUS | Status: AC
  Administered 2022-09-25: 10 mL via INTRAVENOUS
  Filled 2022-09-25: qty 10

## 2022-09-25 MED ORDER — HEPARIN SOD (PORK) LOCK FLUSH 100 UNIT/ML IV SOLN
500.0000 [IU] | Freq: Once | INTRAVENOUS | Status: DC | PRN
  Filled 2022-09-25: qty 5

## 2022-09-25 MED ORDER — SODIUM CHLORIDE 0.9 % IV SOLN
10.0000 mg/kg | Freq: Once | INTRAVENOUS | Status: AC
  Administered 2022-09-25: 740 mg via INTRAVENOUS
  Filled 2022-09-25: qty 4.8

## 2022-09-25 MED ORDER — SODIUM CHLORIDE 0.9 % IV SOLN
Freq: Once | INTRAVENOUS | Status: AC
  Filled 2022-09-25: qty 250

## 2022-09-25 MED ORDER — HEPARIN SOD (PORK) LOCK FLUSH 100 UNIT/ML IV SOLN
500.0000 [IU] | Freq: Once | INTRAVENOUS | Status: AC
  Administered 2022-09-25: 500 [IU] via INTRAVENOUS
  Filled 2022-09-25: qty 5

## 2022-09-25 NOTE — Progress Notes (Signed)
Pt in for follow up, reports pain in hips and groin bilaterally intermittently.

## 2022-09-25 NOTE — Progress Notes (Signed)
[facility]  Telephone:(336) [phone] Fax:(336) [phone]  ID: Connie West OB: 06/28/1955  MR#: [id number]  QIO#:[id number]  Patient Care Team: Excell Seltzer, MD as PCP - General Glory Buff, RN as Oncology Nurse Navigator Orlie Dakin, Tollie Pizza, MD as Consulting Physician (Oncology)  CHIEF COMPLAINT: Stage IIIb adenocarcinoma of the lung.  INTERVAL HISTORY: Patient returns to clinic today for further evaluation and consideration of cycle 4 of maintenance durvalumab.  She has bilateral hip pain consistent with arthritis, but otherwise feels well.  She is tolerating her treatments without significant side effects.  She has a mild peripheral neuropathy, that does not affect her day-to-day activity.  She has no other neurologic complaints.  She denies any recent fevers or illnesses.  She has a good appetite and denies weight loss.  She has no chest pain, shortness of breath, cough, or hemoptysis.  She denies any nausea, vomiting, constipation, or diarrhea.  She has no urinary complaints.  Patient offers no further specific complaints today.  REVIEW OF SYSTEMS:   Review of Systems  Constitutional: Negative.  Negative for fever, malaise/fatigue and weight loss.  Respiratory: Negative.  Negative for cough, hemoptysis and shortness of breath.   Cardiovascular: Negative.  Negative for chest pain and leg swelling.  Gastrointestinal: Negative.  Negative for abdominal pain.  Genitourinary: Negative.  Negative for dysuria.  Musculoskeletal:  Positive for joint pain. Negative for back pain.  Skin: Negative.  Negative for rash.  Neurological:  Positive for tingling and sensory change. Negative for dizziness, focal weakness, weakness and headaches.  Psychiatric/Behavioral: Negative.  The patient is not nervous/anxious.     As per HPI. Otherwise, a complete review of systems is negative.  PAST MEDICAL HISTORY: Past Medical History:  Diagnosis Date   Complication of anesthesia     Hyperlipidemia    PONV (postoperative nausea and vomiting)    TIA (transient ischemic attack)     PAST SURGICAL HISTORY: Past Surgical History:  Procedure Laterality Date   BREAST CYST ASPIRATION Right 07/20/2012   FNA benign   BREAST CYST ASPIRATION Right    BREAST SURGERY Right 07-20-12   FNA benign   BRONCHIAL NEEDLE ASPIRATION BIOPSY  06/07/2022   Procedure: BRONCHIAL NEEDLE ASPIRATION BIOPSIES;  Surgeon: Raechel Chute, MD;  [facility];  Service: Pulmonary;;   CESAREAN SECTION     CHOLECYSTECTOMY     COLONOSCOPY WITH PROPOFOL N/A 03/21/2021   Procedure: COLONOSCOPY WITH PROPOFOL;  Surgeon: Toney Reil, MD;  Location: [facility] END[facility];  Service: Gastroenterology;  Laterality: N/A;   IR IMAGING GUIDED PORT INSERTION  06/21/2022   OVARY SURGERY     VIDEO BRONCHOSCOPY WITH ENDOBRONCHIAL ULTRASOUND N/A 06/07/2022   Procedure: VIDEO BRONCHOSCOPY WITH ENDOBRONCHIAL ULTRASOUND;  Surgeon: Raechel Chute, MD;  [facility]: [facility];  Service: Pulmonary;  Laterality: N/A;    FAMILY HISTORY: Family History  Problem Relation Age of Onset   Breast cancer Mother 75   Cancer Mother        breast   Cancer Father        bone cancer    ADVANCED DIRECTIVES (Y/N):  N  HEALTH MAINTENANCE: Social History   Tobacco Use   Smoking status: Former    Current packs/day: 0.00    Average packs/day: 0.6 packs/day for 43.0 years (26.7 ttl pk-yrs)    Types: Cigarettes    Start date: 4    Quit date: 2016    Years since quitting: 8.6   Smokeless tobacco: Never   Tobacco comments:  quit x 1 month 11/16  Vaping Use   Vaping status: Never Used  Substance Use Topics   Alcohol use: Yes    Comment: occasional : 1x/week   Drug use: No     Colonoscopy:  PAP:  Bone density:  Lipid panel:  Allergies  Allergen Reactions   Penicillins Hives    Current Outpatient Medications  Medication Sig Dispense Refill   aspirin EC 81 MG tablet Take 81 mg by mouth in the morning.      calcium carbonate (OS-CAL) 600 MG TABS Take 600 mg by mouth every evening.     Coenzyme Q10 (CO Q 10 PO) Take 1 capsule by mouth in the morning.     Fluocinolone Acetonide 0.01 % OIL Apply twice daily to ears as needed for rash/itching 20 mL 2   ibuprofen (ADVIL,MOTRIN) 200 MG tablet Take 400 mg by mouth every 8 (eight) hours as needed (pain.).     ketoconazole (NIZORAL) 2 % shampoo 2-3 times per week lather on scalp and ears, leave on 8-10 minutes, rinse well (Patient taking differently: Apply 1 Application topically once a week. Once per week lather on scalp and ears, leave on 8-10 minutes, rinse well) 120 mL 5   lidocaine-prilocaine (EMLA) cream Apply 1 Application topically as needed.     MAGNESIUM PO Take 1 tablet by mouth every evening.     OMEGA-3 FATTY ACIDS PO Take 1 g by mouth every evening.     simvastatin (ZOCOR) 40 MG tablet TAKE 1 TABLET BY MOUTH EVERY DAY 90 tablet 3   VITAMIN D PO Take 1,000 Units by mouth in the morning.     No current facility-administered medications for this visit.   Facility-Administered Medications Ordered in Other Visits  Medication Dose Route Frequency Provider Last Rate Last Admin   heparin lock flush 100 unit/mL  500 Units Intracatheter Once PRN Jeralyn Ruths, MD        OBJECTIVE: Vitals:   09/25/22 0929  BP: (!) 143/85  Pulse: 100  Resp: 16  Temp: 97.6 F (36.4 C)  SpO2: 98%     Body mass index is 26.23 kg/m.    ECOG FS:0 - Asymptomatic  General: Well-developed, well-nourished, no acute distress. Eyes: Pink conjunctiva, anicteric sclera. HEENT: Normocephalic, moist mucous membranes. Lungs: No audible wheezing or coughing. Heart: Regular rate and rhythm. Abdomen: Soft, nontender, no obvious distention. Musculoskeletal: No edema, cyanosis, or clubbing. Neuro: Alert, answering all questions appropriately. Cranial nerves grossly intact. Skin: No rashes or petechiae noted. Psych: Normal affect.  LAB RESULTS:  Lab Results   Component Value Date   NA 134 (L) 09/25/2022   K 4.0 09/25/2022   CL 103 09/25/2022   CO2 22 09/25/2022   GLUCOSE 123 (H) 09/25/2022   BUN 14 09/25/2022   CREATININE 0.79 09/25/2022   CALCIUM 9.1 09/25/2022   PROT 7.4 09/25/2022   ALBUMIN 3.5 09/25/2022   AST 23 09/25/2022   ALT 13 09/25/2022   ALKPHOS 64 09/25/2022   BILITOT 0.4 09/25/2022   GFRNONAA >60 09/25/2022   GFRAA >60 01/04/2018    Lab Results  Component Value Date   WBC 6.5 09/25/2022   NEUTROABS 4.7 09/25/2022   HGB 10.7 (L) 09/25/2022   HCT 33.4 (L) 09/25/2022   MCV 92.3 09/25/2022   PLT 400 09/25/2022     STUDIES: No results found.  ASSESSMENT: Stage IIIb adenocarcinoma of the lung.  PLAN:    Stage IIIb adenocarcinoma of the lung: Biopsy from bronchoscopy on  June 07, 2022 confirming the diagnosis.  PET scan results from May 22, 2022 reviewed independently confirming stage of disease.  The bilateral hypermetabolic cervical lymph nodes are suspicious, but will treat patient as a stage IIIb.  MRI of the brain on Jun 19, 2022 did not reveal any metastatic disease.  Patient completed weekly carboplatin and Taxol on July 31, 2022.  Patient now receiving maintenance durvalumab every 2 weeks for 1 year.  She initiated on August 14, 2022.  Proceed with cycle 4 of treatment today.  Return to clinic in 2 weeks for treatment only and then in 4 weeks for further evaluation and consideration of cycle 6.  Will reimage with PET scan prior to cycle 6. Peripheral neuropathy: Chronic and unchanged. Hyponatremia: Chronic and unchanged.  Patient's sodium level has increased to 134. Leukopenia: Resolved. Anemia: Patient's hemoglobin has trended up to 10.7. Insomnia: Continue Xanax as needed.   Patient expressed understanding and was in agreement with this plan. She also understands that She can call clinic at any time with any questions, concerns, or complaints.    Cancer Staging  Adenocarcinoma of right lung [facility]) Staging  form: Lung, AJCC 8th Edition - Clinical stage from 06/12/2022: Stage IIIB (cT2b, cN3, cM0) - Signed by Jeralyn Ruths, MD on 06/12/2022 Stage prefix: Initial diagnosis   Jeralyn Ruths, MD   09/25/2022 3:00 PM

## 2022-09-25 NOTE — Patient Instructions (Signed)
[facility] [place] [facility]  Discharge Instructions: Thank you for choosing [facility] to provide your oncology and hematology care.  If you have a lab appointment with the Cancer Center, please go directly to the Cancer Center and check in at the registration area.  Wear comfortable clothing and clothing appropriate for easy access to any Portacath or PICC line.   We strive to give you quality time with your provider. You may need to reschedule your appointment if you arrive late (15 or more minutes).  Arriving late affects you and other patients whose appointments are after yours.  Also, if you miss three or more appointments without notifying the office, you may be dismissed from the clinic at the provider's discretion.      For prescription refill requests, have your pharmacy contact our office and allow 72 hours for refills to be completed.    Today you received the following chemotherapy and/or immunotherapy agents Imfinzi        To help prevent nausea and vomiting after your treatment, we encourage you to take your nausea medication as directed.  BELOW ARE SYMPTOMS THAT SHOULD BE REPORTED IMMEDIATELY: *FEVER GREATER THAN 100.4 F (38 C) OR HIGHER *CHILLS OR SWEATING *NAUSEA AND VOMITING THAT IS NOT CONTROLLED WITH YOUR NAUSEA MEDICATION *UNUSUAL SHORTNESS OF BREATH *UNUSUAL BRUISING OR BLEEDING *URINARY PROBLEMS (pain or burning when urinating, or frequent urination) *BOWEL PROBLEMS (unusual diarrhea, constipation, pain near the anus) TENDERNESS IN MOUTH AND THROAT WITH OR WITHOUT PRESENCE OF ULCERS (sore throat, sores in mouth, or a toothache) UNUSUAL RASH, SWELLING OR PAIN  UNUSUAL VAGINAL DISCHARGE OR ITCHING   Items with * indicate a potential emergency and should be followed up as soon as possible or go to the Emergency Department if any problems should occur.  Please show the CHEMOTHERAPY ALERT CARD or IMMUNOTHERAPY ALERT CARD at check-in to  the Emergency Department and triage nurse.  Should you have questions after your visit or need to cancel or reschedule your appointment, please contact [facility] [place] [facility]  [phone] and follow the prompts.  Office hours are 8:00 a.m. to 4:30 p.m. Monday - Friday. Please note that voicemails left after 4:00 p.m. may not be returned until the following business day.  We are closed weekends and major holidays. You have access to a nurse at all times for urgent questions. Please call the main number to the clinic [phone] and follow the prompts.  For any non-urgent questions, you may also contact your provider using MyChart. We now offer e-Visits for anyone 18 and older to request care online for non-urgent symptoms. For details visit [email or web address].   Also download the MyChart app! Go to the app store, search "MyChart", open the app, select [place], and log in with your MyChart username and password.    

## 2022-10-09 ENCOUNTER — Other Ambulatory Visit: Payer: Medicare Other

## 2022-10-09 ENCOUNTER — Inpatient Hospital Stay: Payer: Medicare Other

## 2022-10-09 ENCOUNTER — Ambulatory Visit: Payer: Medicare Other | Admitting: Oncology

## 2022-10-09 VITALS — BP 139/80 | HR 94 | Temp 98.9°F | Resp 18 | Wt 160.7 lb

## 2022-10-09 DIAGNOSIS — E871 Hypo-osmolality and hyponatremia: Secondary | ICD-10-CM | POA: Diagnosis not present

## 2022-10-09 DIAGNOSIS — C3491 Malignant neoplasm of unspecified part of right bronchus or lung: Secondary | ICD-10-CM

## 2022-10-09 DIAGNOSIS — Z5112 Encounter for antineoplastic immunotherapy: Secondary | ICD-10-CM | POA: Diagnosis not present

## 2022-10-09 DIAGNOSIS — D649 Anemia, unspecified: Secondary | ICD-10-CM | POA: Diagnosis not present

## 2022-10-09 DIAGNOSIS — G629 Polyneuropathy, unspecified: Secondary | ICD-10-CM | POA: Diagnosis not present

## 2022-10-09 DIAGNOSIS — C3411 Malignant neoplasm of upper lobe, right bronchus or lung: Secondary | ICD-10-CM | POA: Diagnosis not present

## 2022-10-09 MED ORDER — SODIUM CHLORIDE 0.9 % IV SOLN
10.0000 mg/kg | Freq: Once | INTRAVENOUS | Status: AC
  Administered 2022-10-09: 740 mg via INTRAVENOUS
  Filled 2022-10-09: qty 10

## 2022-10-09 MED ORDER — HEPARIN SOD (PORK) LOCK FLUSH 100 UNIT/ML IV SOLN
500.0000 [IU] | Freq: Once | INTRAVENOUS | Status: AC | PRN
  Administered 2022-10-09: 500 [IU]
  Filled 2022-10-09: qty 5

## 2022-10-09 MED ORDER — SODIUM CHLORIDE 0.9 % IV SOLN
Freq: Once | INTRAVENOUS | Status: AC
  Filled 2022-10-09: qty 250

## 2022-10-18 ENCOUNTER — Encounter: Payer: Self-pay | Admitting: Oncology

## 2022-10-21 ENCOUNTER — Ambulatory Visit
Admission: RE | Admit: 2022-10-21 | Discharge: 2022-10-21 | Disposition: A | Discharge status: Home / Self Care | Payer: Medicare Other | Source: Ambulatory Visit | Attending: Oncology | Admitting: Oncology

## 2022-10-21 DIAGNOSIS — I700 Atherosclerosis of aorta: Secondary | ICD-10-CM | POA: Insufficient documentation

## 2022-10-21 DIAGNOSIS — J90 Pleural effusion, not elsewhere classified: Secondary | ICD-10-CM | POA: Diagnosis not present

## 2022-10-21 DIAGNOSIS — C3491 Malignant neoplasm of unspecified part of right bronchus or lung: Secondary | ICD-10-CM | POA: Insufficient documentation

## 2022-10-21 DIAGNOSIS — C3411 Malignant neoplasm of upper lobe, right bronchus or lung: Secondary | ICD-10-CM | POA: Diagnosis not present

## 2022-10-21 LAB — GLUCOSE, CAPILLARY: Glucose-Capillary: 107 mg/dL — ABNORMAL HIGH (ref 70–99)

## 2022-10-21 MED ORDER — FLUDEOXYGLUCOSE F - 18 (FDG) INJECTION
8.3900 | Freq: Once | INTRAVENOUS | Status: AC | PRN
  Administered 2022-10-21: 8.39 via INTRAVENOUS

## 2022-10-23 ENCOUNTER — Inpatient Hospital Stay: Payer: Medicare Other | Admitting: Oncology

## 2022-10-23 ENCOUNTER — Inpatient Hospital Stay: Payer: Medicare Other

## 2022-10-23 ENCOUNTER — Inpatient Hospital Stay: Payer: Medicare Other | Attending: Oncology

## 2022-10-23 DIAGNOSIS — C3411 Malignant neoplasm of upper lobe, right bronchus or lung: Secondary | ICD-10-CM | POA: Diagnosis not present

## 2022-10-23 DIAGNOSIS — G4700 Insomnia, unspecified: Secondary | ICD-10-CM | POA: Insufficient documentation

## 2022-10-23 DIAGNOSIS — Z5112 Encounter for antineoplastic immunotherapy: Secondary | ICD-10-CM | POA: Diagnosis not present

## 2022-10-23 DIAGNOSIS — E871 Hypo-osmolality and hyponatremia: Secondary | ICD-10-CM | POA: Diagnosis not present

## 2022-10-23 DIAGNOSIS — G629 Polyneuropathy, unspecified: Secondary | ICD-10-CM | POA: Insufficient documentation

## 2022-10-23 DIAGNOSIS — Z87891 Personal history of nicotine dependence: Secondary | ICD-10-CM | POA: Diagnosis not present

## 2022-10-23 DIAGNOSIS — Z79899 Other long term (current) drug therapy: Secondary | ICD-10-CM | POA: Insufficient documentation

## 2022-10-23 DIAGNOSIS — C3491 Malignant neoplasm of unspecified part of right bronchus or lung: Secondary | ICD-10-CM

## 2022-10-23 DIAGNOSIS — D649 Anemia, unspecified: Secondary | ICD-10-CM | POA: Diagnosis not present

## 2022-10-23 LAB — CBC WITH DIFFERENTIAL (CANCER CENTER ONLY)
Abs Immature Granulocytes: 0.02 10*3/uL (ref 0.00–0.07)
Basophils Absolute: 0 10*3/uL (ref 0.0–0.1)
Basophils Relative: 0 %
Eosinophils Absolute: 0.1 10*3/uL (ref 0.0–0.5)
Eosinophils Relative: 1 %
HCT: 35.7 % — ABNORMAL LOW (ref 36.0–46.0)
Hemoglobin: 11.3 g/dL — ABNORMAL LOW (ref 12.0–15.0)
Immature Granulocytes: 0 %
Lymphocytes Relative: 15 %
Lymphs Abs: 0.9 10*3/uL (ref 0.7–4.0)
MCH: 28.1 pg (ref 26.0–34.0)
MCHC: 31.7 g/dL (ref 30.0–36.0)
MCV: 88.8 fL (ref 80.0–100.0)
Monocytes Absolute: 0.5 10*3/uL (ref 0.1–1.0)
Monocytes Relative: 8 %
Neutro Abs: 4.5 10*3/uL (ref 1.7–7.7)
Neutrophils Relative %: 76 %
Platelet Count: 390 10*3/uL (ref 150–400)
RBC: 4.02 MIL/uL (ref 3.87–5.11)
RDW: 13.5 % (ref 11.5–15.5)
WBC Count: 5.9 10*3/uL (ref 4.0–10.5)
nRBC: 0 % (ref 0.0–0.2)

## 2022-10-23 LAB — TSH: TSH: 1.803 u[IU]/mL (ref 0.350–4.500)

## 2022-10-23 LAB — CMP (CANCER CENTER ONLY)
ALT: 14 U/L (ref 0–44)
AST: 21 U/L (ref 15–41)
Albumin: 3.4 g/dL — ABNORMAL LOW (ref 3.5–5.0)
Alkaline Phosphatase: 71 U/L (ref 38–126)
Anion gap: 7 (ref 5–15)
BUN: 18 mg/dL (ref 8–23)
CO2: 24 mmol/L (ref 22–32)
Calcium: 9.1 mg/dL (ref 8.9–10.3)
Chloride: 103 mmol/L (ref 98–111)
Creatinine: 0.72 mg/dL (ref 0.44–1.00)
GFR, Estimated: >60 mL/min (ref >60)
Glucose, Bld: 163 mg/dL — ABNORMAL HIGH (ref 70–99)
Potassium: 3.9 mmol/L (ref 3.5–5.1)
Sodium: 134 mmol/L — ABNORMAL LOW (ref 135–145)
Total Bilirubin: 0.4 mg/dL (ref 0.3–1.2)
Total Protein: 7.2 g/dL (ref 6.5–8.1)

## 2022-10-23 MED ORDER — HEPARIN SOD (PORK) LOCK FLUSH 100 UNIT/ML IV SOLN
500.0000 [IU] | Freq: Once | INTRAVENOUS | Status: AC | PRN
  Administered 2022-10-23: 500 [IU]
  Filled 2022-10-23: qty 5

## 2022-10-23 MED ORDER — SODIUM CHLORIDE 0.9 % IV SOLN
Freq: Once | INTRAVENOUS | Status: AC
  Filled 2022-10-23: qty 250

## 2022-10-23 MED ORDER — SODIUM CHLORIDE 0.9 % IV SOLN
10.0000 mg/kg | Freq: Once | INTRAVENOUS | Status: AC
  Administered 2022-10-23: 740 mg via INTRAVENOUS
  Filled 2022-10-23: qty 4.8

## 2022-10-23 NOTE — Progress Notes (Signed)
[facility]  Telephone:([phone]) [phone] Fax:(336) [phone]  ID: Connie West OB: 1956-01-02  MR#: [id number]  AOZ#:[id number]  Patient Care Team: Excell Seltzer, MD as PCP - General Glory Buff, RN as Oncology Nurse Navigator Orlie Dakin, Tollie Pizza, MD as Consulting Physician (Oncology)  CHIEF COMPLAINT: Stage IIIb adenocarcinoma of the lung.  INTERVAL HISTORY: Patient returns to clinic today for further evaluation and consideration of cycle 6 of maintenance durvalumab.  She currently feels well and is asymptomatic.  She does not complain of hip pain today.  She continues to tolerate her treatments without significant side effects. She has a mild peripheral neuropathy, that does not affect her day-to-day activity.  She has no other neurologic complaints.  She denies any recent fevers or illnesses.  She has a good appetite and denies weight loss.  She has no chest pain, shortness of breath, cough, or hemoptysis.  She denies any nausea, vomiting, constipation, or diarrhea.  She has no urinary complaints.  Patient offers no further specific complaints today.  REVIEW OF SYSTEMS:   Review of Systems  Constitutional: Negative.  Negative for fever, malaise/fatigue and weight loss.  Respiratory: Negative.  Negative for cough, hemoptysis and shortness of breath.   Cardiovascular: Negative.  Negative for chest pain and leg swelling.  Gastrointestinal: Negative.  Negative for abdominal pain.  Genitourinary: Negative.  Negative for dysuria.  Musculoskeletal:  Positive for joint pain. Negative for back pain.  Skin: Negative.  Negative for rash.  Neurological:  Positive for tingling and sensory change. Negative for dizziness, focal weakness, weakness and headaches.  Psychiatric/Behavioral: Negative.  The patient is not nervous/anxious.     As per HPI. Otherwise, a complete review of systems is negative.  PAST MEDICAL HISTORY: Past Medical History:  Diagnosis Date   Complication of  anesthesia    Hyperlipidemia    PONV (postoperative nausea and vomiting)    TIA (transient ischemic attack)     PAST SURGICAL HISTORY: Past Surgical History:  Procedure Laterality Date   BREAST CYST ASPIRATION Right 07/20/2012   FNA benign   BREAST CYST ASPIRATION Right    BREAST SURGERY Right 07-20-12   FNA benign   BRONCHIAL NEEDLE ASPIRATION BIOPSY  06/07/2022   Procedure: BRONCHIAL NEEDLE ASPIRATION BIOPSIES;  Surgeon: Raechel Chute, MD;  Location: [facility] END[facility];  Service: Pulmonary;;   CESAREAN SECTION     CHOLECYSTECTOMY     COLONOSCOPY WITH PROPOFOL N/A 03/21/2021   Procedure: COLONOSCOPY WITH PROPOFOL;  Surgeon: Toney Reil, MD;  Location: [facility] ENDOSCOPY;  Service: Gastroenterology;  Laterality: N/A;   IR IMAGING GUIDED PORT INSERTION  06/21/2022   OVARY SURGERY     VIDEO BRONCHOSCOPY WITH ENDOBRONCHIAL ULTRASOUND N/A 06/07/2022   Procedure: VIDEO BRONCHOSCOPY WITH ENDOBRONCHIAL ULTRASOUND;  Surgeon: Raechel Chute, MD;  Location: [facility] ENDOSCOPY;  Service: Pulmonary;  Laterality: N/A;    FAMILY HISTORY: Family History  Problem Relation Age of Onset   Breast cancer Mother 40   Cancer Mother        breast   Cancer Father        bone cancer    ADVANCED DIRECTIVES (Y/N):  N  HEALTH MAINTENANCE: Social History   Tobacco Use   Smoking status: Former    Current packs/day: 0.00    Average packs/day: 0.6 packs/day for 43.0 years (26.7 ttl pk-yrs)    Types: Cigarettes    Start date: 61    Quit date: 2016    Years since quitting: 8.7   Smokeless tobacco: Never  Tobacco comments:    quit x 1 month 11/16  Vaping Use   Vaping status: Never Used  Substance Use Topics   Alcohol use: Yes    Comment: occasional : 1x/week   Drug use: No     Colonoscopy:  PAP:  Bone density:  Lipid panel:  Allergies  Allergen Reactions   Penicillins Hives    Current Outpatient Medications  Medication Sig Dispense Refill   aspirin EC 81 MG tablet Take 81 mg by mouth in the  morning.     calcium carbonate (OS-CAL) 600 MG TABS Take 600 mg by mouth every evening.     Coenzyme Q10 (CO Q 10 PO) Take 1 capsule by mouth in the morning.     Fluocinolone Acetonide 0.01 % OIL Apply twice daily to ears as needed for rash/itching 20 mL 2   ibuprofen (ADVIL,MOTRIN) 200 MG tablet Take 400 mg by mouth every 8 (eight) hours as needed (pain.).     ketoconazole (NIZORAL) 2 % shampoo 2-3 times per week lather on scalp and ears, leave on 8-10 minutes, rinse well (Patient taking differently: Apply 1 Application topically once a week. Once per week lather on scalp and ears, leave on 8-10 minutes, rinse well) 120 mL 5   lidocaine-prilocaine (EMLA) cream Apply 1 Application topically as needed.     MAGNESIUM PO Take 1 tablet by mouth every evening.     OMEGA-3 FATTY ACIDS PO Take 1 g by mouth every evening.     simvastatin (ZOCOR) 40 MG tablet TAKE 1 TABLET BY MOUTH EVERY DAY 90 tablet 3   VITAMIN D PO Take 1,000 Units by mouth in the morning.     No current facility-administered medications for this visit.    OBJECTIVE: Vitals:   10/23/22 0854  BP: (!) 132/98  Pulse: 92  Temp: (!) 96.8 F (36 C)  SpO2: 100%     Body mass index is 25.5 kg/m.    ECOG FS:0 - Asymptomatic  General: Well-developed, well-nourished, no acute distress. Eyes: Pink conjunctiva, anicteric sclera. HEENT: Normocephalic, moist mucous membranes. Lungs: No audible wheezing or coughing. Heart: Regular rate and rhythm. Abdomen: Soft, nontender, no obvious distention. Musculoskeletal: No edema, cyanosis, or clubbing. Neuro: Alert, answering all questions appropriately. Cranial nerves grossly intact. Skin: No rashes or petechiae noted. Psych: Normal affect.  LAB RESULTS:  Lab Results  Component Value Date   NA 134 (L) 10/23/2022   K 3.9 10/23/2022   CL 103 10/23/2022   CO2 24 10/23/2022   GLUCOSE 163 (H) 10/23/2022   BUN 18 10/23/2022   CREATININE 0.72 10/23/2022   CALCIUM 9.1 10/23/2022   PROT  7.2 10/23/2022   ALBUMIN 3.4 (L) 10/23/2022   AST 21 10/23/2022   ALT 14 10/23/2022   ALKPHOS 71 10/23/2022   BILITOT 0.4 10/23/2022   GFRNONAA >60 10/23/2022   GFRAA >60 01/04/2018    Lab Results  Component Value Date   WBC 5.9 10/23/2022   NEUTROABS 4.5 10/23/2022   HGB 11.3 (L) 10/23/2022   HCT 35.7 (L) 10/23/2022   MCV 88.8 10/23/2022   PLT 390 10/23/2022     STUDIES: No results found.  ASSESSMENT: Stage IIIb adenocarcinoma of the lung.  PLAN:    Stage IIIb adenocarcinoma of the lung: Biopsy from bronchoscopy on June 07, 2022 confirming the diagnosis.  PET scan results from May 22, 2022 reviewed independently confirming stage of disease.  The bilateral hypermetabolic cervical lymph nodes are suspicious, but will treat patient as a  stage IIIb.  MRI of the brain on Jun 19, 2022 did not reveal any metastatic disease.  Patient completed weekly carboplatin and Taxol on July 31, 2022.  Patient now receiving maintenance durvalumab every 2 weeks for 1 year.  She initiated maintenance treatment on August 14, 2022.  PET scan results from October 21, 2022 are pending at time of dictation.  Proceed with cycle 6 of treatment today.  Return to clinic in 2 weeks for treatment only and in 4 weeks for further evaluation and consideration of cycle 8.   Peripheral neuropathy: Chronic and unchanged. Hyponatremia: Chronic and unchanged.  Patient's sodium is 134 today. Anemia: Hemoglobin continues to trend up and is now 11.3. Insomnia: Chronic and unchanged.  Continue Xanax as needed.   Patient expressed understanding and was in agreement with this plan. She also understands that She can call clinic at any time with any questions, concerns, or complaints.    Cancer Staging  Adenocarcinoma of right lung [facility]) Staging form: Lung, AJCC 8th Edition - Clinical stage from 06/12/2022: Stage IIIB (cT2b, cN3, cM0) - Signed by Jeralyn Ruths, MD on 06/12/2022 Stage prefix: Initial  diagnosis   Jeralyn Ruths, MD   10/23/2022 9:05 AM

## 2022-10-23 NOTE — Patient Instructions (Signed)
[place] [facility]  Discharge Instructions: Thank you for choosing [facility] to provide your oncology and hematology care.  If you have a lab appointment with the Cancer Center, please go directly to the Cancer Center and check in at the registration area.  Wear comfortable clothing and clothing appropriate for easy access to any Portacath or PICC line.   We strive to give you quality time with your provider. You may need to reschedule your appointment if you arrive late (15 or more minutes).  Arriving late affects you and other patients whose appointments are after yours.  Also, if you miss three or more appointments without notifying the office, you may be dismissed from the clinic at the provider's discretion.      For prescription refill requests, have your pharmacy contact our office and allow 72 hours for refills to be completed.    Today you received the following chemotherapy and/or immunotherapy agents IMFINZI      To help prevent nausea and vomiting after your treatment, we encourage you to take your nausea medication as directed.  BELOW ARE SYMPTOMS THAT SHOULD BE REPORTED IMMEDIATELY: *FEVER GREATER THAN 100.4 F (38 C) OR HIGHER *CHILLS OR SWEATING *NAUSEA AND VOMITING THAT IS NOT CONTROLLED WITH YOUR NAUSEA MEDICATION *UNUSUAL SHORTNESS OF BREATH *UNUSUAL BRUISING OR BLEEDING *URINARY PROBLEMS (pain or burning when urinating, or frequent urination) *BOWEL PROBLEMS (unusual diarrhea, constipation, pain near the anus) TENDERNESS IN MOUTH AND THROAT WITH OR WITHOUT PRESENCE OF ULCERS (sore throat, sores in mouth, or a toothache) UNUSUAL RASH, SWELLING OR PAIN  UNUSUAL VAGINAL DISCHARGE OR ITCHING   Items with * indicate a potential emergency and should be followed up as soon as possible or go to the Emergency Department if any problems should occur.  Please show the CHEMOTHERAPY ALERT CARD or IMMUNOTHERAPY ALERT CARD at check-in to  the Emergency Department and triage nurse.  Should you have questions after your visit or need to cancel or reschedule your appointment, please contact [place] [facility]  [phone] and follow the prompts.  Office hours are 8:00 a.m. to 4:30 p.m. Monday - Friday. Please note that voicemails left after 4:00 p.m. may not be returned until the following business day.  We are closed weekends and major holidays. You have access to a nurse at all times for urgent questions. Please call the main number to the clinic [phone] and follow the prompts.  For any non-urgent questions, you may also contact your provider using MyChart. We now offer e-Visits for anyone 26 and older to request care online for non-urgent symptoms. For details visit [email or web address].   Also download the MyChart app! Go to the app store, search "MyChart", open the app, select [place], and log in with your MyChart username and password.  Durvalumab Injection What is this medication? DURVALUMAB (dur VAL ue mab) treats some types of cancer. It works by helping your immune system slow or stop the spread of cancer cells. It is a monoclonal antibody. This medicine may be used for other purposes; ask your health care provider or pharmacist if you have questions. COMMON BRAND NAME(S): IMFINZI What should I tell my care team before I take this medication? They need to know if you have any of these conditions: Allogeneic stem cell transplant (uses someone else's stem cells) Autoimmune diseases, such as Crohn disease, ulcerative colitis, lupus History of chest radiation Nervous system problems, such as Guillain-Barre syndrome, myasthenia gravis Organ  transplant An unusual or allergic reaction to durvalumab, other medications, foods, dyes, or preservatives Pregnant or trying to get pregnant Breast-feeding How should I use this medication? This medication is infused into a vein. It is given by  your care team in a hospital or clinic setting. A special MedGuide will be given to you before each treatment. Be sure to read this information carefully each time. Talk to your care team about the use of this medication in children. Special care may be needed. Overdosage: If you think you have taken too much of this medicine contact a poison control center or emergency room at once. NOTE: This medicine is only for you. Do not share this medicine with others. What if I miss a dose? Keep appointments for follow-up doses. It is important not to miss your dose. Call your care team if you are unable to keep an appointment. What may interact with this medication? Interactions have not been studied. This list may not describe all possible interactions. Give your health care provider a list of all the medicines, herbs, non-prescription drugs, or dietary supplements you use. Also tell them if you smoke, drink alcohol, or use illegal drugs. Some items may interact with your medicine. What should I watch for while using this medication? Your condition will be monitored carefully while you are receiving this medication. You may need blood work while taking this medication. This medication may cause serious skin reactions. They can happen weeks to months after starting the medication. Contact your care team right away if you notice fevers or flu-like symptoms with a rash. The rash may be red or purple and then turn into blisters or peeling of the skin. You may also notice a red rash with swelling of the face, lips, or lymph nodes in your neck or under your arms. Tell your care team right away if you have any change in your eyesight. Talk to your care team if you may be pregnant. Serious birth defects can occur if you take this medication during pregnancy and for 3 months after the last dose. You will need a negative pregnancy test before starting this medication. Contraception is recommended while taking this  medication and for 3 months after the last dose. Your care team can help you find the option that works for you. Do not breastfeed while taking this medication and for 3 months after the last dose. What side effects may I notice from receiving this medication? Side effects that you should report to your care team as soon as possible: Allergic reactions--skin rash, itching, hives, swelling of the face, lips, tongue, or throat Dry cough, shortness of breath or trouble breathing Eye pain, redness, irritation, or discharge with blurry or decreased vision Heart muscle inflammation--unusual weakness or fatigue, shortness of breath, chest pain, fast or irregular heartbeat, dizziness, swelling of the ankles, feet, or hands Hormone gland problems--headache, sensitivity to light, unusual weakness or fatigue, dizziness, fast or irregular heartbeat, increased sensitivity to cold or heat, excessive sweating, constipation, hair loss, increased thirst or amount of urine, tremors or shaking, irritability Infusion reactions--chest pain, shortness of breath or trouble breathing, feeling faint or lightheaded Kidney injury (glomerulonephritis)--decrease in the amount of urine, red or dark brown urine, foamy or bubbly urine, swelling of the ankles, hands, or feet Liver injury--right upper belly pain, loss of appetite, nausea, light-colored stool, dark yellow or brown urine, yellowing skin or eyes, unusual weakness or fatigue Pain, tingling, or numbness in the hands or feet, muscle weakness, change  in vision, confusion or trouble speaking, loss of balance or coordination, trouble walking, seizures Rash, fever, and swollen lymph nodes Redness, blistering, peeling, or loosening of the skin, including inside the mouth Sudden or severe stomach pain, bloody diarrhea, fever, nausea, vomiting Side effects that usually do not require medical attention (report these to your care team if they continue or are bothersome): Bone,  joint, or muscle pain Diarrhea Fatigue Loss of appetite Nausea Skin rash This list may not describe all possible side effects. Call your doctor for medical advice about side effects. You may report side effects to FDA at 1-800-FDA-1088. Where should I keep my medication? This medication is given in a hospital or clinic. It will not be stored at home. NOTE: This sheet is a summary. It may not cover all possible information. If you have questions about this medicine, talk to your doctor, pharmacist, or health care provider.  2024 Elsevier/Gold Standard (2021-06-12 00:00:00)

## 2022-10-23 NOTE — Progress Notes (Signed)
Patient is wanting to know if Dr. Nolon Stalls for her to get the flu and the Covid vaccine and if so can she get both at the same time?

## 2022-10-24 LAB — T4: T4, Total: 10.1 ug/dL (ref 4.5–12.0)

## 2022-10-25 ENCOUNTER — Other Ambulatory Visit: Payer: Medicare Other

## 2022-11-06 ENCOUNTER — Inpatient Hospital Stay: Payer: Medicare Other

## 2022-11-06 VITALS — BP 129/79 | HR 100 | Temp 97.9°F | Resp 18

## 2022-11-06 DIAGNOSIS — Z87891 Personal history of nicotine dependence: Secondary | ICD-10-CM | POA: Diagnosis not present

## 2022-11-06 DIAGNOSIS — E871 Hypo-osmolality and hyponatremia: Secondary | ICD-10-CM | POA: Diagnosis not present

## 2022-11-06 DIAGNOSIS — Z5112 Encounter for antineoplastic immunotherapy: Secondary | ICD-10-CM | POA: Diagnosis not present

## 2022-11-06 DIAGNOSIS — G629 Polyneuropathy, unspecified: Secondary | ICD-10-CM | POA: Diagnosis not present

## 2022-11-06 DIAGNOSIS — G4700 Insomnia, unspecified: Secondary | ICD-10-CM | POA: Diagnosis not present

## 2022-11-06 DIAGNOSIS — C3411 Malignant neoplasm of upper lobe, right bronchus or lung: Secondary | ICD-10-CM | POA: Diagnosis not present

## 2022-11-06 DIAGNOSIS — D649 Anemia, unspecified: Secondary | ICD-10-CM | POA: Diagnosis not present

## 2022-11-06 DIAGNOSIS — Z79899 Other long term (current) drug therapy: Secondary | ICD-10-CM | POA: Diagnosis not present

## 2022-11-06 DIAGNOSIS — C3491 Malignant neoplasm of unspecified part of right bronchus or lung: Secondary | ICD-10-CM

## 2022-11-06 MED ORDER — SODIUM CHLORIDE 0.9 % IV SOLN
10.0000 mg/kg | Freq: Once | INTRAVENOUS | Status: AC
  Administered 2022-11-06: 740 mg via INTRAVENOUS
  Filled 2022-11-06: qty 10

## 2022-11-06 MED ORDER — SODIUM CHLORIDE 0.9 % IV SOLN
Freq: Once | INTRAVENOUS | Status: AC
  Filled 2022-11-06: qty 250

## 2022-11-06 MED ORDER — HEPARIN SOD (PORK) LOCK FLUSH 100 UNIT/ML IV SOLN
500.0000 [IU] | Freq: Once | INTRAVENOUS | Status: AC | PRN
  Administered 2022-11-06: 500 [IU]
  Filled 2022-11-06: qty 5

## 2022-11-06 NOTE — Patient Instructions (Signed)
[facility] [place] [facility]  Discharge Instructions: Thank you for choosing [facility] to provide your oncology and hematology care.  If you have a lab appointment with the Cancer Center, please go directly to the Cancer Center and check in at the registration area.  Wear comfortable clothing and clothing appropriate for easy access to any Portacath or PICC line.   We strive to give you quality time with your provider. You may need to reschedule your appointment if you arrive late (15 or more minutes).  Arriving late affects you and other patients whose appointments are after yours.  Also, if you miss three or more appointments without notifying the office, you may be dismissed from the clinic at the provider's discretion.      For prescription refill requests, have your pharmacy contact our office and allow 72 hours for refills to be completed.    Today you received the following chemotherapy and/or immunotherapy agents Imfinzi        To help prevent nausea and vomiting after your treatment, we encourage you to take your nausea medication as directed.  BELOW ARE SYMPTOMS THAT SHOULD BE REPORTED IMMEDIATELY: *FEVER GREATER THAN 100.4 F (38 C) OR HIGHER *CHILLS OR SWEATING *NAUSEA AND VOMITING THAT IS NOT CONTROLLED WITH YOUR NAUSEA MEDICATION *UNUSUAL SHORTNESS OF BREATH *UNUSUAL BRUISING OR BLEEDING *URINARY PROBLEMS (pain or burning when urinating, or frequent urination) *BOWEL PROBLEMS (unusual diarrhea, constipation, pain near the anus) TENDERNESS IN MOUTH AND THROAT WITH OR WITHOUT PRESENCE OF ULCERS (sore throat, sores in mouth, or a toothache) UNUSUAL RASH, SWELLING OR PAIN  UNUSUAL VAGINAL DISCHARGE OR ITCHING   Items with * indicate a potential emergency and should be followed up as soon as possible or go to the Emergency Department if any problems should occur.  Please show the CHEMOTHERAPY ALERT CARD or IMMUNOTHERAPY ALERT CARD at check-in to  the Emergency Department and triage nurse.  Should you have questions after your visit or need to cancel or reschedule your appointment, please contact [facility] [place] [facility]  [phone] and follow the prompts.  Office hours are 8:00 a.m. to 4:30 p.m. Monday - Friday. Please note that voicemails left after 4:00 p.m. may not be returned until the following business day.  We are closed weekends and major holidays. You have access to a nurse at all times for urgent questions. Please call the main number to the clinic [phone] and follow the prompts.  For any non-urgent questions, you may also contact your provider using MyChart. We now offer e-Visits for anyone 18 and older to request care online for non-urgent symptoms. For details visit [email or web address].   Also download the MyChart app! Go to the app store, search "MyChart", open the app, select [place], and log in with your MyChart username and password.    

## 2022-11-20 ENCOUNTER — Inpatient Hospital Stay: Payer: Medicare Other | Admitting: Oncology

## 2022-11-20 ENCOUNTER — Encounter: Payer: Self-pay | Admitting: Oncology

## 2022-11-20 ENCOUNTER — Inpatient Hospital Stay: Payer: Medicare Other | Attending: Oncology

## 2022-11-20 ENCOUNTER — Inpatient Hospital Stay: Payer: Medicare Other

## 2022-11-20 DIAGNOSIS — C3491 Malignant neoplasm of unspecified part of right bronchus or lung: Secondary | ICD-10-CM

## 2022-11-20 DIAGNOSIS — Z79620 Long term (current) use of immunosuppressive biologic: Secondary | ICD-10-CM | POA: Insufficient documentation

## 2022-11-20 DIAGNOSIS — G4700 Insomnia, unspecified: Secondary | ICD-10-CM | POA: Diagnosis not present

## 2022-11-20 DIAGNOSIS — Z79899 Other long term (current) drug therapy: Secondary | ICD-10-CM | POA: Insufficient documentation

## 2022-11-20 DIAGNOSIS — D649 Anemia, unspecified: Secondary | ICD-10-CM | POA: Insufficient documentation

## 2022-11-20 DIAGNOSIS — C3411 Malignant neoplasm of upper lobe, right bronchus or lung: Secondary | ICD-10-CM | POA: Insufficient documentation

## 2022-11-20 DIAGNOSIS — E871 Hypo-osmolality and hyponatremia: Secondary | ICD-10-CM | POA: Insufficient documentation

## 2022-11-20 DIAGNOSIS — G629 Polyneuropathy, unspecified: Secondary | ICD-10-CM | POA: Insufficient documentation

## 2022-11-20 DIAGNOSIS — Z5112 Encounter for antineoplastic immunotherapy: Secondary | ICD-10-CM | POA: Insufficient documentation

## 2022-11-20 LAB — CBC WITH DIFFERENTIAL (CANCER CENTER ONLY)
Abs Immature Granulocytes: 0.02 10*3/uL (ref 0.00–0.07)
Basophils Absolute: 0 10*3/uL (ref 0.0–0.1)
Basophils Relative: 0 %
Eosinophils Absolute: 0.1 10*3/uL (ref 0.0–0.5)
Eosinophils Relative: 1 %
HCT: 36.3 % (ref 36.0–46.0)
Hemoglobin: 11.7 g/dL — ABNORMAL LOW (ref 12.0–15.0)
Immature Granulocytes: 0 %
Lymphocytes Relative: 15 %
Lymphs Abs: 0.7 10*3/uL (ref 0.7–4.0)
MCH: 27.2 pg (ref 26.0–34.0)
MCHC: 32.2 g/dL (ref 30.0–36.0)
MCV: 84.4 fL (ref 80.0–100.0)
Monocytes Absolute: 0.5 10*3/uL (ref 0.1–1.0)
Monocytes Relative: 11 %
Neutro Abs: 3.6 10*3/uL (ref 1.7–7.7)
Neutrophils Relative %: 73 %
Platelet Count: 343 10*3/uL (ref 150–400)
RBC: 4.3 MIL/uL (ref 3.87–5.11)
RDW: 13.9 % (ref 11.5–15.5)
WBC Count: 4.9 10*3/uL (ref 4.0–10.5)
nRBC: 0 % (ref 0.0–0.2)

## 2022-11-20 LAB — CMP (CANCER CENTER ONLY)
ALT: 18 U/L (ref 0–44)
AST: 30 U/L (ref 15–41)
Albumin: 3.6 g/dL (ref 3.5–5.0)
Alkaline Phosphatase: 82 U/L (ref 38–126)
Anion gap: 9 (ref 5–15)
BUN: 8 mg/dL (ref 8–23)
CO2: 22 mmol/L (ref 22–32)
Calcium: 9.1 mg/dL (ref 8.9–10.3)
Chloride: 103 mmol/L (ref 98–111)
Creatinine: 0.81 mg/dL (ref 0.44–1.00)
GFR, Estimated: >60 mL/min (ref >60)
Glucose, Bld: 143 mg/dL — ABNORMAL HIGH (ref 70–99)
Potassium: 3.7 mmol/L (ref 3.5–5.1)
Sodium: 134 mmol/L — ABNORMAL LOW (ref 135–145)
Total Bilirubin: 0.5 mg/dL (ref 0.3–1.2)
Total Protein: 7.3 g/dL (ref 6.5–8.1)

## 2022-11-20 LAB — TSH: TSH: 2.543 u[IU]/mL (ref 0.350–4.500)

## 2022-11-20 MED ORDER — HEPARIN SOD (PORK) LOCK FLUSH 100 UNIT/ML IV SOLN
500.0000 [IU] | Freq: Once | INTRAVENOUS | Status: AC | PRN
  Filled 2022-11-20: qty 5

## 2022-11-20 MED ORDER — SODIUM CHLORIDE 0.9 % IV SOLN
10.0000 mg/kg | Freq: Once | INTRAVENOUS | Status: AC
  Administered 2022-11-20: 740 mg via INTRAVENOUS
  Filled 2022-11-20: qty 10

## 2022-11-20 MED ORDER — SODIUM CHLORIDE 0.9% FLUSH
10.0000 mL | Freq: Once | INTRAVENOUS | Status: AC
  Administered 2022-11-20: 10 mL via INTRAVENOUS
  Filled 2022-11-20: qty 10

## 2022-11-20 MED ORDER — HEPARIN SOD (PORK) LOCK FLUSH 100 UNIT/ML IV SOLN
500.0000 [IU] | Freq: Once | INTRAVENOUS | Status: AC
  Administered 2022-11-20: 500 [IU] via INTRAVENOUS
  Filled 2022-11-20: qty 5

## 2022-11-20 MED ORDER — SODIUM CHLORIDE 0.9 % IV SOLN
Freq: Once | INTRAVENOUS | Status: AC
  Filled 2022-11-20: qty 250

## 2022-11-20 NOTE — Patient Instructions (Signed)
[place] [facility]  Discharge Instructions: Thank you for choosing [facility] to provide your oncology and hematology care.  If you have a lab appointment with the Cancer Center, please go directly to the Cancer Center and check in at the registration area.  Wear comfortable clothing and clothing appropriate for easy access to any Portacath or PICC line.   We strive to give you quality time with your provider. You may need to reschedule your appointment if you arrive late (15 or more minutes).  Arriving late affects you and other patients whose appointments are after yours.  Also, if you miss three or more appointments without notifying the office, you may be dismissed from the clinic at the provider's discretion.      For prescription refill requests, have your pharmacy contact our office and allow 72 hours for refills to be completed.    Today you received the following chemotherapy and/or immunotherapy agents- Durvalumab      To help prevent nausea and vomiting after your treatment, we encourage you to take your nausea medication as directed.  BELOW ARE SYMPTOMS THAT SHOULD BE REPORTED IMMEDIATELY: *FEVER GREATER THAN 100.4 F (38 C) OR HIGHER *CHILLS OR SWEATING *NAUSEA AND VOMITING THAT IS NOT CONTROLLED WITH YOUR NAUSEA MEDICATION *UNUSUAL SHORTNESS OF BREATH *UNUSUAL BRUISING OR BLEEDING *URINARY PROBLEMS (pain or burning when urinating, or frequent urination) *BOWEL PROBLEMS (unusual diarrhea, constipation, pain near the anus) TENDERNESS IN MOUTH AND THROAT WITH OR WITHOUT PRESENCE OF ULCERS (sore throat, sores in mouth, or a toothache) UNUSUAL RASH, SWELLING OR PAIN  UNUSUAL VAGINAL DISCHARGE OR ITCHING   Items with * indicate a potential emergency and should be followed up as soon as possible or go to the Emergency Department if any problems should occur.  Please show the CHEMOTHERAPY ALERT CARD or IMMUNOTHERAPY ALERT CARD at check-in  to the Emergency Department and triage nurse.  Should you have questions after your visit or need to cancel or reschedule your appointment, please contact [place] [facility]  [phone] and follow the prompts.  Office hours are 8:00 a.m. to 4:30 p.m. Monday - Friday. Please note that voicemails left after 4:00 p.m. may not be returned until the following business day.  We are closed weekends and major holidays. You have access to a nurse at all times for urgent questions. Please call the main number to the clinic [phone] and follow the prompts.  For any non-urgent questions, you may also contact your provider using MyChart. We now offer e-Visits for anyone 63 and older to request care online for non-urgent symptoms. For details visit [email or web address].   Also download the MyChart app! Go to the app store, search "MyChart", open the app, select , and log in with your MyChart username and password.

## 2022-11-20 NOTE — Progress Notes (Signed)
[facility]  [phone] Fax:(336) [phone]  ID: Connie West OB: 08/04/1955  MR#: [id number]  EXB#:[id number]  Patient Care Team: Excell Seltzer, MD as PCP - General Glory Buff, RN as Oncology Nurse Navigator Orlie Dakin, Tollie Pizza, MD as Consulting Physician (Oncology)  CHIEF COMPLAINT: Stage IIIb adenocarcinoma of the lung.  INTERVAL HISTORY: Patient returns to clinic today for further evaluation, discussion of her PET scan results, and consideration of cycle 8 of maintenance durvalumab.  She continues to feel well and remains asymptomatic.  She is tolerating her treatments without significant side effects.  She does not complain of hip pain today.  She has a mild peripheral neuropathy, that does not affect her day-to-day activity.  She has no other neurologic complaints.  She denies any recent fevers or illnesses.  She has a good appetite and denies weight loss.  She has no chest pain, shortness of breath, cough, or hemoptysis.  She denies any nausea, vomiting, constipation, or diarrhea.  She has no urinary complaints.  Patient offers no further specific complaints today.  REVIEW OF SYSTEMS:   Review of Systems  Constitutional: Negative.  Negative for fever, malaise/fatigue and weight loss.  Respiratory: Negative.  Negative for cough, hemoptysis and shortness of breath.   Cardiovascular: Negative.  Negative for chest pain and leg swelling.  Gastrointestinal: Negative.  Negative for abdominal pain.  Genitourinary: Negative.  Negative for dysuria.  Musculoskeletal:  Positive for joint pain. Negative for back pain.  Skin: Negative.  Negative for rash.  Neurological:  Positive for tingling and sensory change. Negative for dizziness, focal weakness, weakness and headaches.  Psychiatric/Behavioral: Negative.  The patient is not nervous/anxious.     As per HPI. Otherwise, a complete review of systems is negative.  PAST MEDICAL HISTORY: Past Medical History:   Diagnosis Date   Complication of anesthesia    Hyperlipidemia    PONV (postoperative nausea and vomiting)    TIA (transient ischemic attack)     PAST SURGICAL HISTORY: Past Surgical History:  Procedure Laterality Date   BREAST CYST ASPIRATION Right 07/20/2012   FNA benign   BREAST CYST ASPIRATION Right    BREAST SURGERY Right 07-20-12   FNA benign   BRONCHIAL NEEDLE ASPIRATION BIOPSY  06/07/2022   Procedure: BRONCHIAL NEEDLE ASPIRATION BIOPSIES;  Surgeon: Raechel Chute, MD;  [facility];  Service: Pulmonary;;   CESAREAN SECTION     CHOLECYSTECTOMY     COLONOSCOPY WITH PROPOFOL N/A 03/21/2021   Procedure: COLONOSCOPY WITH PROPOFOL;  Surgeon: Toney Reil, MD;  Location: [facility] ENDOSCOPY;  Service: Gastroenterology;  Laterality: N/A;   IR IMAGING GUIDED PORT INSERTION  06/21/2022   OVARY SURGERY     VIDEO BRONCHOSCOPY WITH ENDOBRONCHIAL ULTRASOUND N/A 06/07/2022   Procedure: VIDEO BRONCHOSCOPY WITH ENDOBRONCHIAL ULTRASOUND;  Surgeon: Raechel Chute, MD;  [facility];  Service: Pulmonary;  Laterality: N/A;    FAMILY HISTORY: Family History  Problem Relation Age of Onset   Breast cancer Mother 75   Cancer Mother        breast   Cancer Father        bone cancer    ADVANCED DIRECTIVES (Y/N):  N  HEALTH MAINTENANCE: Social History   Tobacco Use   Smoking status: Former    Current packs/day: 0.00    Average packs/day: 0.6 packs/day for 43.0 years (26.7 ttl pk-yrs)    Types: Cigarettes    Start date: 77    Quit date: 2016    Years since  quitting: 8.7   Smokeless tobacco: Never   Tobacco comments:    quit x 1 month 11/16  Vaping Use   Vaping status: Never Used  Substance Use Topics   Alcohol use: Yes    Comment: occasional : 1x/week   Drug use: No     Colonoscopy:  PAP:  Bone density:  Lipid panel:  Allergies  Allergen Reactions   Penicillins Hives    Current Outpatient Medications  Medication Sig Dispense Refill   aspirin EC 81 MG  tablet Take 81 mg by mouth in the morning.     calcium carbonate (OS-CAL) 600 MG TABS Take 600 mg by mouth every evening.     Coenzyme Q10 (CO Q 10 PO) Take 1 capsule by mouth in the morning.     Fluocinolone Acetonide 0.01 % OIL Apply twice daily to ears as needed for rash/itching 20 mL 2   ibuprofen (ADVIL,MOTRIN) 200 MG tablet Take 400 mg by mouth every 8 (eight) hours as needed (pain.).     ketoconazole (NIZORAL) 2 % shampoo 2-3 times per week lather on scalp and ears, leave on 8-10 minutes, rinse well (Patient taking differently: Apply 1 Application topically once a week. Once per week lather on scalp and ears, leave on 8-10 minutes, rinse well) 120 mL 5   lidocaine-prilocaine (EMLA) cream Apply 1 Application topically as needed.     MAGNESIUM PO Take 1 tablet by mouth every evening.     OMEGA-3 FATTY ACIDS PO Take 1 g by mouth every evening.     simvastatin (ZOCOR) 40 MG tablet TAKE 1 TABLET BY MOUTH EVERY DAY 90 tablet 3   VITAMIN D PO Take 1,000 Units by mouth in the morning.     No current facility-administered medications for this visit.    OBJECTIVE: Vitals:   11/20/22 0921  BP: 114/81  Pulse: 95  Resp: 18  Temp: 97.9 F (36.6 C)  SpO2: 99%     Body mass index is 25.5 kg/m.    ECOG FS:0 - Asymptomatic  General: Well-developed, well-nourished, no acute distress. Eyes: Pink conjunctiva, anicteric sclera. HEENT: Normocephalic, moist mucous membranes. Lungs: No audible wheezing or coughing. Heart: Regular rate and rhythm. Abdomen: Soft, nontender, no obvious distention. Musculoskeletal: No edema, cyanosis, or clubbing. Neuro: Alert, answering all questions appropriately. Cranial nerves grossly intact. Skin: No rashes or petechiae noted. Psych: Normal affect.  LAB RESULTS:  Lab Results  Component Value Date   NA 134 (L) 11/20/2022   K 3.7 11/20/2022   CL 103 11/20/2022   CO2 22 11/20/2022   GLUCOSE 143 (H) 11/20/2022   BUN 8 11/20/2022   CREATININE 0.81  11/20/2022   CALCIUM 9.1 11/20/2022   PROT 7.3 11/20/2022   ALBUMIN 3.6 11/20/2022   AST 30 11/20/2022   ALT 18 11/20/2022   ALKPHOS 82 11/20/2022   BILITOT 0.5 11/20/2022   GFRNONAA >60 11/20/2022   GFRAA >60 01/04/2018    Lab Results  Component Value Date   WBC 4.9 11/20/2022   NEUTROABS 3.6 11/20/2022   HGB 11.7 (L) 11/20/2022   HCT 36.3 11/20/2022   MCV 84.4 11/20/2022   PLT 343 11/20/2022     STUDIES: No results found.  ASSESSMENT: Stage IIIb adenocarcinoma of the lung.  PLAN:    Stage IIIb adenocarcinoma of the lung: Biopsy from bronchoscopy on June 07, 2022 confirming the diagnosis.  PET scan results from May 22, 2022 reviewed independently confirming stage of disease.  The bilateral hypermetabolic cervical lymph nodes  are suspicious, but will treat patient as a stage IIIb.  MRI of the brain on Jun 19, 2022 did not reveal any metastatic disease.  Patient completed weekly carboplatin and Taxol on July 31, 2022.  Patient now receiving maintenance durvalumab every 2 weeks for 1 year.  She initiated maintenance treatment on August 14, 2022.  PET scan results from November 20, 2022 reviewed independently with interval improvement of disease along with extensive posttreatment changes.  Proceed with cycle 8 of treatment today.  Return to clinic in 2 weeks for treatment only and then in 4 weeks for further evaluation and consideration of cycle 10.   Peripheral neuropathy: Chronic and unchanged. Hyponatremia: Chronic and unchanged.  Patient's sodium is 134 today.   Anemia: Continues to trend up and is now 11.7.   Insomnia: Chronic and unchanged.  Continue Xanax as needed.   Patient expressed understanding and was in agreement with this plan. She also understands that She can call clinic at any time with any questions, concerns, or complaints.    Cancer Staging  Adenocarcinoma of right lung [facility]) Staging form: Lung, AJCC 8th Edition - Clinical stage from 06/12/2022: Stage IIIB  (cT2b, cN3, cM0) - Signed by Jeralyn Ruths, MD on 06/12/2022 Stage prefix: Initial diagnosis   Jeralyn Ruths, MD   11/20/2022 2:26 PM

## 2022-11-20 NOTE — Progress Notes (Signed)
Patient denies any concerns today.  

## 2022-11-22 LAB — T4: T4, Total: 10.9 ug/dL (ref 4.5–12.0)

## 2022-11-26 ENCOUNTER — Other Ambulatory Visit: Payer: Self-pay

## 2022-12-04 ENCOUNTER — Inpatient Hospital Stay: Payer: Medicare Other

## 2022-12-04 VITALS — BP 133/88 | HR 94 | Temp 99.0°F | Resp 18 | Wt 157.2 lb

## 2022-12-04 DIAGNOSIS — G4700 Insomnia, unspecified: Secondary | ICD-10-CM | POA: Diagnosis not present

## 2022-12-04 DIAGNOSIS — Z79899 Other long term (current) drug therapy: Secondary | ICD-10-CM | POA: Diagnosis not present

## 2022-12-04 DIAGNOSIS — Z5112 Encounter for antineoplastic immunotherapy: Secondary | ICD-10-CM | POA: Diagnosis not present

## 2022-12-04 DIAGNOSIS — Z79620 Long term (current) use of immunosuppressive biologic: Secondary | ICD-10-CM | POA: Diagnosis not present

## 2022-12-04 DIAGNOSIS — C3491 Malignant neoplasm of unspecified part of right bronchus or lung: Secondary | ICD-10-CM

## 2022-12-04 DIAGNOSIS — C3411 Malignant neoplasm of upper lobe, right bronchus or lung: Secondary | ICD-10-CM | POA: Diagnosis not present

## 2022-12-04 DIAGNOSIS — D649 Anemia, unspecified: Secondary | ICD-10-CM | POA: Diagnosis not present

## 2022-12-04 DIAGNOSIS — E871 Hypo-osmolality and hyponatremia: Secondary | ICD-10-CM | POA: Diagnosis not present

## 2022-12-04 DIAGNOSIS — G629 Polyneuropathy, unspecified: Secondary | ICD-10-CM | POA: Diagnosis not present

## 2022-12-04 MED ORDER — SODIUM CHLORIDE 0.9 % IV SOLN
Freq: Once | INTRAVENOUS | Status: AC
  Filled 2022-12-04: qty 250

## 2022-12-04 MED ORDER — SODIUM CHLORIDE 0.9 % IV SOLN
10.0000 mg/kg | Freq: Once | INTRAVENOUS | Status: AC
  Administered 2022-12-04: 740 mg via INTRAVENOUS
  Filled 2022-12-04: qty 10

## 2022-12-04 MED ORDER — HEPARIN SOD (PORK) LOCK FLUSH 100 UNIT/ML IV SOLN
500.0000 [IU] | Freq: Once | INTRAVENOUS | Status: AC | PRN
  Administered 2022-12-04: 500 [IU]
  Filled 2022-12-04: qty 5

## 2022-12-04 NOTE — Patient Instructions (Signed)
[facility] [place] [facility]  Discharge Instructions: Thank you for choosing [facility] to provide your oncology and hematology care.  If you have a lab appointment with the Cancer Center, please go directly to the Cancer Center and check in at the registration area.  Wear comfortable clothing and clothing appropriate for easy access to any Portacath or PICC line.   We strive to give you quality time with your provider. You may need to reschedule your appointment if you arrive late (15 or more minutes).  Arriving late affects you and other patients whose appointments are after yours.  Also, if you miss three or more appointments without notifying the office, you may be dismissed from the clinic at the provider's discretion.      For prescription refill requests, have your pharmacy contact our office and allow 72 hours for refills to be completed.    Today you received the following chemotherapy and/or immunotherapy agents Imfinzi        To help prevent nausea and vomiting after your treatment, we encourage you to take your nausea medication as directed.  BELOW ARE SYMPTOMS THAT SHOULD BE REPORTED IMMEDIATELY: *FEVER GREATER THAN 100.4 F (38 C) OR HIGHER *CHILLS OR SWEATING *NAUSEA AND VOMITING THAT IS NOT CONTROLLED WITH YOUR NAUSEA MEDICATION *UNUSUAL SHORTNESS OF BREATH *UNUSUAL BRUISING OR BLEEDING *URINARY PROBLEMS (pain or burning when urinating, or frequent urination) *BOWEL PROBLEMS (unusual diarrhea, constipation, pain near the anus) TENDERNESS IN MOUTH AND THROAT WITH OR WITHOUT PRESENCE OF ULCERS (sore throat, sores in mouth, or a toothache) UNUSUAL RASH, SWELLING OR PAIN  UNUSUAL VAGINAL DISCHARGE OR ITCHING   Items with * indicate a potential emergency and should be followed up as soon as possible or go to the Emergency Department if any problems should occur.  Please show the CHEMOTHERAPY ALERT CARD or IMMUNOTHERAPY ALERT CARD at check-in to  the Emergency Department and triage nurse.  Should you have questions after your visit or need to cancel or reschedule your appointment, please contact [facility] [place] [facility]  [phone] and follow the prompts.  Office hours are 8:00 a.m. to 4:30 p.m. Monday - Friday. Please note that voicemails left after 4:00 p.m. may not be returned until the following business day.  We are closed weekends and major holidays. You have access to a nurse at all times for urgent questions. Please call the main number to the clinic [phone] and follow the prompts.  For any non-urgent questions, you may also contact your provider using MyChart. We now offer e-Visits for anyone 18 and older to request care online for non-urgent symptoms. For details visit [email or web address].   Also download the MyChart app! Go to the app store, search "MyChart", open the app, select [place], and log in with your MyChart username and password.    

## 2022-12-17 ENCOUNTER — Encounter: Payer: Self-pay | Admitting: Oncology

## 2022-12-18 ENCOUNTER — Inpatient Hospital Stay: Payer: Medicare Other | Attending: Oncology

## 2022-12-18 ENCOUNTER — Inpatient Hospital Stay: Payer: Medicare Other

## 2022-12-18 ENCOUNTER — Inpatient Hospital Stay: Payer: Medicare Other | Admitting: Nurse Practitioner

## 2022-12-18 ENCOUNTER — Encounter: Payer: Self-pay | Admitting: Nurse Practitioner

## 2022-12-18 VITALS — BP 132/84 | HR 88 | Temp 96.5°F | Wt 157.0 lb

## 2022-12-18 VITALS — BP 133/82 | HR 78 | Temp 97.0°F | Resp 18

## 2022-12-18 DIAGNOSIS — G629 Polyneuropathy, unspecified: Secondary | ICD-10-CM | POA: Diagnosis not present

## 2022-12-18 DIAGNOSIS — C3491 Malignant neoplasm of unspecified part of right bronchus or lung: Secondary | ICD-10-CM

## 2022-12-18 DIAGNOSIS — E785 Hyperlipidemia, unspecified: Secondary | ICD-10-CM | POA: Diagnosis not present

## 2022-12-18 DIAGNOSIS — E871 Hypo-osmolality and hyponatremia: Secondary | ICD-10-CM | POA: Insufficient documentation

## 2022-12-18 DIAGNOSIS — Z79620 Long term (current) use of immunosuppressive biologic: Secondary | ICD-10-CM | POA: Diagnosis not present

## 2022-12-18 DIAGNOSIS — G4700 Insomnia, unspecified: Secondary | ICD-10-CM | POA: Insufficient documentation

## 2022-12-18 DIAGNOSIS — Z79899 Other long term (current) drug therapy: Secondary | ICD-10-CM | POA: Insufficient documentation

## 2022-12-18 DIAGNOSIS — Z5112 Encounter for antineoplastic immunotherapy: Secondary | ICD-10-CM | POA: Diagnosis not present

## 2022-12-18 DIAGNOSIS — Z87440 Personal history of urinary (tract) infections: Secondary | ICD-10-CM | POA: Diagnosis not present

## 2022-12-18 DIAGNOSIS — J90 Pleural effusion, not elsewhere classified: Secondary | ICD-10-CM | POA: Insufficient documentation

## 2022-12-18 DIAGNOSIS — D649 Anemia, unspecified: Secondary | ICD-10-CM | POA: Diagnosis not present

## 2022-12-18 DIAGNOSIS — I700 Atherosclerosis of aorta: Secondary | ICD-10-CM | POA: Diagnosis not present

## 2022-12-18 DIAGNOSIS — C3411 Malignant neoplasm of upper lobe, right bronchus or lung: Secondary | ICD-10-CM | POA: Insufficient documentation

## 2022-12-18 LAB — CBC WITH DIFFERENTIAL (CANCER CENTER ONLY)
Abs Immature Granulocytes: 0.01 10*3/uL (ref 0.00–0.07)
Basophils Absolute: 0 10*3/uL (ref 0.0–0.1)
Basophils Relative: 1 %
Eosinophils Absolute: 0.1 10*3/uL (ref 0.0–0.5)
Eosinophils Relative: 1 %
HCT: 36.2 % (ref 36.0–46.0)
Hemoglobin: 11.7 g/dL — ABNORMAL LOW (ref 12.0–15.0)
Immature Granulocytes: 0 %
Lymphocytes Relative: 18 %
Lymphs Abs: 0.9 10*3/uL (ref 0.7–4.0)
MCH: 26.7 pg (ref 26.0–34.0)
MCHC: 32.3 g/dL (ref 30.0–36.0)
MCV: 82.5 fL (ref 80.0–100.0)
Monocytes Absolute: 0.4 10*3/uL (ref 0.1–1.0)
Monocytes Relative: 9 %
Neutro Abs: 3.5 10*3/uL (ref 1.7–7.7)
Neutrophils Relative %: 71 %
Platelet Count: 327 10*3/uL (ref 150–400)
RBC: 4.39 MIL/uL (ref 3.87–5.11)
RDW: 14.5 % (ref 11.5–15.5)
WBC Count: 5 10*3/uL (ref 4.0–10.5)
nRBC: 0 % (ref 0.0–0.2)

## 2022-12-18 LAB — CMP (CANCER CENTER ONLY)
ALT: 19 U/L (ref 0–44)
AST: 28 U/L (ref 15–41)
Albumin: 3.7 g/dL (ref 3.5–5.0)
Alkaline Phosphatase: 78 U/L (ref 38–126)
Anion gap: 7 (ref 5–15)
BUN: 11 mg/dL (ref 8–23)
CO2: 24 mmol/L (ref 22–32)
Calcium: 9.2 mg/dL (ref 8.9–10.3)
Chloride: 102 mmol/L (ref 98–111)
Creatinine: 0.78 mg/dL (ref 0.44–1.00)
GFR, Estimated: >60 mL/min (ref >60)
Glucose, Bld: 151 mg/dL — ABNORMAL HIGH (ref 70–99)
Potassium: 3.7 mmol/L (ref 3.5–5.1)
Sodium: 133 mmol/L — ABNORMAL LOW (ref 135–145)
Total Bilirubin: 0.3 mg/dL (ref <1.2)
Total Protein: 7.2 g/dL (ref 6.5–8.1)

## 2022-12-18 LAB — TSH: TSH: 2.288 u[IU]/mL (ref 0.350–4.500)

## 2022-12-18 MED ORDER — BUPROPION HCL ER (XL) 150 MG PO TB24: 150.0000 mg | ORAL_TABLET | Freq: Every morning | ORAL | 1 refills | Status: DC

## 2022-12-18 MED ORDER — SODIUM CHLORIDE 0.9 % IV SOLN
Freq: Once | INTRAVENOUS | Status: AC
  Filled 2022-12-18: qty 250

## 2022-12-18 MED ORDER — HEPARIN SOD (PORK) LOCK FLUSH 100 UNIT/ML IV SOLN
500.0000 [IU] | Freq: Once | INTRAVENOUS | Status: AC | PRN
  Administered 2022-12-18: 500 [IU]
  Filled 2022-12-18: qty 5

## 2022-12-18 MED ORDER — SODIUM CHLORIDE 0.9 % IV SOLN
10.0000 mg/kg | Freq: Once | INTRAVENOUS | Status: AC
  Administered 2022-12-18: 740 mg via INTRAVENOUS
  Filled 2022-12-18: qty 4.8

## 2022-12-18 NOTE — Patient Instructions (Signed)
[facility] - A DEPT OF [facility]  Discharge Instructions: Thank you for choosing [facility] to provide your oncology and hematology care.  If you have a lab appointment with the Cancer Center, please go directly to the Cancer Center and check in at the registration area.  Wear comfortable clothing and clothing appropriate for easy access to any Portacath or PICC line.   We strive to give you quality time with your provider. You may need to reschedule your appointment if you arrive late (15 or more minutes).  Arriving late affects you and other patients whose appointments are after yours.  Also, if you miss three or more appointments without notifying the office, you may be dismissed from the clinic at the provider's discretion.      For prescription refill requests, have your pharmacy contact our office and allow 72 hours for refills to be completed.    Today you received the following chemotherapy and/or immunotherapy agents Imfinzi      To help prevent nausea and vomiting after your treatment, we encourage you to take your nausea medication as directed.  BELOW ARE SYMPTOMS THAT SHOULD BE REPORTED IMMEDIATELY: *FEVER GREATER THAN 100.4 F (38 C) OR HIGHER *CHILLS OR SWEATING *NAUSEA AND VOMITING THAT IS NOT CONTROLLED WITH YOUR NAUSEA MEDICATION *UNUSUAL SHORTNESS OF BREATH *UNUSUAL BRUISING OR BLEEDING *URINARY PROBLEMS (pain or burning when urinating, or frequent urination) *BOWEL PROBLEMS (unusual diarrhea, constipation, pain near the anus) TENDERNESS IN MOUTH AND THROAT WITH OR WITHOUT PRESENCE OF ULCERS (sore throat, sores in mouth, or a toothache) UNUSUAL RASH, SWELLING OR PAIN  UNUSUAL VAGINAL DISCHARGE OR ITCHING   Items with * indicate a potential emergency and should be followed up as soon as possible or go to the Emergency Department if any problems should occur.  Please show the CHEMOTHERAPY ALERT CARD or IMMUNOTHERAPY ALERT  CARD at check-in to the Emergency Department and triage nurse.  Should you have questions after your visit or need to cancel or reschedule your appointment, please contact  CANCER CENTER - A DEPT OF [facility]  [phone] and follow the prompts.  Office hours are 8:00 a.m. to 4:30 p.m. Monday - Friday. Please note that voicemails left after 4:00 p.m. may not be returned until the following business day.  We are closed weekends and major holidays. You have access to a nurse at all times for urgent questions. Please call the main number to the clinic [phone] and follow the prompts.  For any non-urgent questions, you may also contact your provider using MyChart. We now offer e-Visits for anyone 3 and older to request care online for non-urgent symptoms. For details visit [email or web address].   Also download the MyChart app! Go to the app store, search "MyChart", open the app, select [place], and log in with your MyChart username and password.

## 2022-12-18 NOTE — Progress Notes (Signed)
[facility]  Telephone:(336) [phone] Fax:(336) [phone]  ID: Connie West OB: December 05, 1955  MR#: [id number]  JJO#:[id number]  Patient Care Team: Excell Seltzer, MD as PCP - General Glory Buff, RN as Oncology Nurse Navigator Orlie Dakin, Tollie Pizza, MD as Consulting Physician (Oncology)  CHIEF COMPLAINT: Stage IIIb adenocarcinoma of the lung  INTERVAL HISTORY: Patient returns to clinic today for further evaluation and consideration of cycle 10 of maintenance durvalumab. She continues to have fatigue that is generalized. Some days better than others. Becomes short of breath with exertion which she finds frustrating. She denies pain today. Her appetite is good and denies rash, cough. No diarrhea. Denies other complaints.   REVIEW OF SYSTEMS:   Review of Systems  Constitutional:  Positive for malaise/fatigue. Negative for fever and weight loss.  Respiratory:  Positive for shortness of breath. Negative for cough and hemoptysis.   Cardiovascular:  Negative for chest pain and leg swelling.  Gastrointestinal:  Negative for abdominal pain.  Genitourinary:  Negative for dysuria.  Musculoskeletal:  Positive for joint pain. Negative for back pain, falls and neck pain.  Skin: Negative.  Negative for rash.  Neurological:  Positive for tingling and sensory change. Negative for dizziness, focal weakness, weakness and headaches.  Psychiatric/Behavioral:  Negative for depression. The patient is not nervous/anxious.   As per HPI. Otherwise, a complete review of systems is negative.   PAST MEDICAL HISTORY: Past Medical History:  Diagnosis Date   Complication of anesthesia    Hyperlipidemia    PONV (postoperative nausea and vomiting)    TIA (transient ischemic attack)     PAST SURGICAL HISTORY: Past Surgical History:  Procedure Laterality Date   BREAST CYST ASPIRATION Right 07/20/2012   FNA benign   BREAST CYST ASPIRATION Right    BREAST SURGERY Right 07-20-12   FNA benign    BRONCHIAL NEEDLE ASPIRATION BIOPSY  06/07/2022   Procedure: BRONCHIAL NEEDLE ASPIRATION BIOPSIES;  Surgeon: Raechel Chute, MD;  [facility];  Service: Pulmonary;;   CESAREAN SECTION     CHOLECYSTECTOMY     COLONOSCOPY WITH PROPOFOL N/A 03/21/2021   Procedure: COLONOSCOPY WITH PROPOFOL;  Surgeon: Toney Reil, MD;  Location: [facility] ENDOSCOPY;  Service: Gastroenterology;  Laterality: N/A;   IR IMAGING GUIDED PORT INSERTION  06/21/2022   OVARY SURGERY     VIDEO BRONCHOSCOPY WITH ENDOBRONCHIAL ULTRASOUND N/A 06/07/2022   Procedure: VIDEO BRONCHOSCOPY WITH ENDOBRONCHIAL ULTRASOUND;  Surgeon: Raechel Chute, MD;  [facility];  Service: Pulmonary;  Laterality: N/A;    FAMILY HISTORY: Family History  Problem Relation Age of Onset   Breast cancer Mother 70   Cancer Mother        breast   Cancer Father        bone cancer    ADVANCED DIRECTIVES (Y/N):  N  HEALTH MAINTENANCE: Social History   Tobacco Use   Smoking status: Former    Current packs/day: 0.00    Average packs/day: 0.6 packs/day for 43.0 years (26.7 ttl pk-yrs)    Types: Cigarettes    Start date: 54    Quit date: 2016    Years since quitting: 8.8   Smokeless tobacco: Never   Tobacco comments:    quit x 1 month 11/16  Vaping Use   Vaping status: Never Used  Substance Use Topics   Alcohol use: Yes    Comment: occasional : 1x/week   Drug use: No     Colonoscopy:  PAP:  Bone density:  Lipid panel:  Allergies  Allergen Reactions   Penicillins Hives    Current Outpatient Medications  Medication Sig Dispense Refill   aspirin EC 81 MG tablet Take 81 mg by mouth in the morning.     calcium carbonate (OS-CAL) 600 MG TABS Take 600 mg by mouth every evening.     Coenzyme Q10 (CO Q 10 PO) Take 1 capsule by mouth in the morning.     Fluocinolone Acetonide 0.01 % OIL Apply twice daily to ears as needed for rash/itching 20 mL 2   ibuprofen (ADVIL,MOTRIN) 200 MG tablet Take 400 mg by mouth every 8  (eight) hours as needed (pain.).     ketoconazole (NIZORAL) 2 % shampoo 2-3 times per week lather on scalp and ears, leave on 8-10 minutes, rinse well (Patient taking differently: Apply 1 Application topically once a week. Once per week lather on scalp and ears, leave on 8-10 minutes, rinse well) 120 mL 5   lidocaine-prilocaine (EMLA) cream Apply 1 Application topically as needed.     MAGNESIUM PO Take 1 tablet by mouth every evening.     OMEGA-3 FATTY ACIDS PO Take 1 g by mouth every evening.     simvastatin (ZOCOR) 40 MG tablet TAKE 1 TABLET BY MOUTH EVERY DAY 90 tablet 3   VITAMIN D PO Take 1,000 Units by mouth in the morning.     No current facility-administered medications for this visit.    OBJECTIVE: Vitals:   12/18/22 1309  BP: 132/84  Pulse: 88  Temp: (!) 96.5 F (35.8 C)  SpO2: 96%     Body mass index is 25.34 kg/m.    ECOG FS:1 - Symptomatic but completely ambulatory  General: Well-developed, well-nourished, no acute distress. Eyes: Pink conjunctiva, anicteric sclera. Lungs: Clear to auscultation bilaterally.  No audible wheezing or coughing Heart: Regular rate and rhythm.  Abdomen: Soft, nontender, nondistended.  Musculoskeletal: No edema, cyanosis, or clubbing. Neuro: Alert, answering all questions appropriately. Cranial nerves grossly intact. Skin: No rashes or petechiae noted. Psych: Normal affect.   LAB RESULTS: Lab Results  Component Value Date   NA 133 (L) 12/18/2022   K 3.7 12/18/2022   CL 102 12/18/2022   CO2 24 12/18/2022   GLUCOSE 151 (H) 12/18/2022   BUN 11 12/18/2022   CREATININE 0.78 12/18/2022   CALCIUM 9.2 12/18/2022   PROT 7.2 12/18/2022   ALBUMIN 3.7 12/18/2022   AST 28 12/18/2022   ALT 19 12/18/2022   ALKPHOS 78 12/18/2022   BILITOT 0.3 12/18/2022   GFRNONAA >60 12/18/2022   GFRAA >60 01/04/2018    Lab Results  Component Value Date   WBC 5.0 12/18/2022   NEUTROABS 3.5 12/18/2022   HGB 11.7 (L) 12/18/2022   HCT 36.2 12/18/2022    MCV 82.5 12/18/2022   PLT 327 12/18/2022   Lab Results  Component Value Date   TSH 2.543 11/20/2022     STUDIES: 10/21/22- PET Restaging IMPRESSION: 1. Previously characterized right upper lobe lung mass are again noted. The more superior and medial lung mass is mildly decreased in size with similar degree of FDG uptake. The more caudal lesion is moderately decreased in size and degree of tracer uptake. 2. Interval improvement in previous tracer avid nodal metastasis within the neck and chest. 3. Interval development of extensive post treatment changes within the right upper lung. 4. New small right pleural effusion.  Likely reactive. 5.  Aortic Atherosclerosis (ICD10-I70.0).   Electronically Signed   By: Signa Kell M.D.   On:  11/02/2022 16:14  No results found.  ASSESSMENT: Stage IIIb adenocarcinoma of the lung.  PLAN:    Stage IIIb adenocarcinoma of the lung: Biopsy from bronchoscopy on June 07, 2022 confirming the diagnosis.  PET scan results from May 22, 2022 confirmed stage of disease.  The bilateral hypermetabolic cervical lymph nodes are suspicious, but she has been treated as stage IIIb. MRI of the brain on Jun 19, 2022 did not reveal any metastatic disease.  Patient completed weekly carboplatin and Taxol on July 31, 2022.  Patient now receiving maintenance durvalumab every 2 weeks for 1 year.  She initiated maintenance treatment on August 14, 2022.  PET scan results from November 20, 2022 showed interval improvement of disease along with extensive posttreatment changes. Tolerating treatment well. Labs reviewed and acceptable for treatment. Proceed with cycle 10 of durvalumab today. She will return to clinic in 2 weeks for treatment only and then in for weeks for further evaluation and consideration of cycle 12.   Fatigue- unclear if post chemotherapy vs adjustment disorder vs deconditioning. Agrees to trial of wellbutrin 150 mg daily which may have mood improvement and energizing  effect. If tolerating, can increase to 300 mg at next visit.  Peripheral neuropathy: Chronic and unchanged. Hyponatremia: Chronic and unchanged.  Patient's sodium is 133 today.   Anemia: Continues to trend up and is now 11.7.   Insomnia: Chronic and unchanged. Continue Xanax as needed.  Disposition:  Treatment today 2 weeks- port/lab- durvalumab 4 weeks- port/lab, Dr Orlie Dakin, durvalumab- la  Patient expressed understanding and was in agreement with this plan. She also understands that She can call clinic at any time with any questions, concerns, or complaints.    Cancer Staging  Adenocarcinoma of right lung [facility]) Staging form: Lung, AJCC 8th Edition - Clinical stage from 06/12/2022: Stage IIIB (cT2b, cN3, cM0) - Signed by Jeralyn Ruths, MD on 06/12/2022 Stage prefix: Initial diagnosis  Alinda Dooms, NP   12/18/2022

## 2022-12-19 ENCOUNTER — Telehealth: Payer: Medicare Other | Admitting: Physician Assistant

## 2022-12-19 DIAGNOSIS — R300 Dysuria: Secondary | ICD-10-CM

## 2022-12-19 LAB — T4: T4, Total: 9.4 ug/dL (ref 4.5–12.0)

## 2022-12-19 NOTE — Progress Notes (Signed)
Because of risk for more complicated/resistant UTI (age > 62) the standard of care is to get a urine culture to make sure that proper treatment is given based on what the culprit bacteria is sensitive to. As such, we typically want you to be evaluated in person. Have you, by chance, reached out to your PCP office yet for an evaluation?   NEW!! [facility] Health Urgent Care Center at [facility] Get Driving Directions [phone] [address], [address] [place], [address]    [facility] Health Urgent Care Center at [facility] Get Driving Directions [phone] [address] [place], [place] [address]   [facility] Health Urgent Care Center [facility]) Get Driving Directions [phone] [address] [place], [place] [address]  [facility] Health Urgent Care Center [facility]) Get Driving Directions [phone] [address] [place],  [place]  [address]  [facility] Health Urgent Care Center [facility] - at [employer]  [phone] [address] [place],  [place] [address]   [facility] Health Urgent Care at [facility] Get Driving Directions [phone] [address], [address] [place], [place] [address]   [facility] Health Urgent Care at [facility] Get Driving Directions  [phone] [address] [place], [place] [address]   [facility] Health Urgent Care at [facility] Directions [phone] [address] [place], [place] [address]  Your MyChart E-visit questionnaire answers were reviewed by a board certified advanced clinical practitioner to complete your personal care plan based on your specific symptoms.  Thank you for using e-Visits.

## 2022-12-24 ENCOUNTER — Other Ambulatory Visit: Payer: Self-pay

## 2022-12-24 ENCOUNTER — Telehealth: Payer: Self-pay | Admitting: *Deleted

## 2022-12-24 ENCOUNTER — Inpatient Hospital Stay (HOSPITAL_BASED_OUTPATIENT_CLINIC_OR_DEPARTMENT_OTHER): Payer: Medicare Other | Admitting: Hospice and Palliative Medicine

## 2022-12-24 ENCOUNTER — Encounter: Payer: Self-pay | Admitting: Hospice and Palliative Medicine

## 2022-12-24 ENCOUNTER — Other Ambulatory Visit: Payer: Self-pay | Admitting: *Deleted

## 2022-12-24 ENCOUNTER — Inpatient Hospital Stay: Payer: Medicare Other

## 2022-12-24 VITALS — BP 109/83 | HR 116 | Temp 98.3°F | Resp 20 | Ht 66.0 in | Wt 157.0 lb

## 2022-12-24 DIAGNOSIS — R300 Dysuria: Secondary | ICD-10-CM

## 2022-12-24 DIAGNOSIS — G629 Polyneuropathy, unspecified: Secondary | ICD-10-CM | POA: Diagnosis not present

## 2022-12-24 DIAGNOSIS — R509 Fever, unspecified: Secondary | ICD-10-CM

## 2022-12-24 DIAGNOSIS — Z79620 Long term (current) use of immunosuppressive biologic: Secondary | ICD-10-CM | POA: Diagnosis not present

## 2022-12-24 DIAGNOSIS — Z79899 Other long term (current) drug therapy: Secondary | ICD-10-CM | POA: Diagnosis not present

## 2022-12-24 DIAGNOSIS — C3491 Malignant neoplasm of unspecified part of right bronchus or lung: Secondary | ICD-10-CM

## 2022-12-24 DIAGNOSIS — G4700 Insomnia, unspecified: Secondary | ICD-10-CM | POA: Diagnosis not present

## 2022-12-24 DIAGNOSIS — N390 Urinary tract infection, site not specified: Secondary | ICD-10-CM

## 2022-12-24 DIAGNOSIS — D649 Anemia, unspecified: Secondary | ICD-10-CM | POA: Diagnosis not present

## 2022-12-24 DIAGNOSIS — Z87440 Personal history of urinary (tract) infections: Secondary | ICD-10-CM | POA: Diagnosis not present

## 2022-12-24 DIAGNOSIS — J90 Pleural effusion, not elsewhere classified: Secondary | ICD-10-CM | POA: Diagnosis not present

## 2022-12-24 DIAGNOSIS — C3411 Malignant neoplasm of upper lobe, right bronchus or lung: Secondary | ICD-10-CM | POA: Diagnosis not present

## 2022-12-24 DIAGNOSIS — I700 Atherosclerosis of aorta: Secondary | ICD-10-CM | POA: Diagnosis not present

## 2022-12-24 DIAGNOSIS — E871 Hypo-osmolality and hyponatremia: Secondary | ICD-10-CM | POA: Diagnosis not present

## 2022-12-24 DIAGNOSIS — E785 Hyperlipidemia, unspecified: Secondary | ICD-10-CM | POA: Diagnosis not present

## 2022-12-24 DIAGNOSIS — Z5112 Encounter for antineoplastic immunotherapy: Secondary | ICD-10-CM | POA: Diagnosis not present

## 2022-12-24 LAB — URINALYSIS, COMPLETE (UACMP) WITH MICROSCOPIC
Bilirubin Urine: NEGATIVE
Glucose, UA: NEGATIVE mg/dL
Ketones, ur: NEGATIVE mg/dL
Nitrite: NEGATIVE
Protein, ur: 30 mg/dL — AB
Specific Gravity, Urine: 1.011 (ref 1.005–1.030)
WBC, UA: >50 WBC/hpf (ref 0–5)
pH: 5 (ref 5.0–8.0)

## 2022-12-24 LAB — CMP (CANCER CENTER ONLY)
ALT: 20 U/L (ref 0–44)
AST: 29 U/L (ref 15–41)
Albumin: 3.8 g/dL (ref 3.5–5.0)
Alkaline Phosphatase: 82 U/L (ref 38–126)
Anion gap: 12 (ref 5–15)
BUN: 21 mg/dL (ref 8–23)
CO2: 23 mmol/L (ref 22–32)
Calcium: 9.3 mg/dL (ref 8.9–10.3)
Chloride: 99 mmol/L (ref 98–111)
Creatinine: 0.89 mg/dL (ref 0.44–1.00)
GFR, Estimated: >60 mL/min (ref >60)
Glucose, Bld: 125 mg/dL — ABNORMAL HIGH (ref 70–99)
Potassium: 3.4 mmol/L — ABNORMAL LOW (ref 3.5–5.1)
Sodium: 134 mmol/L — ABNORMAL LOW (ref 135–145)
Total Bilirubin: 1 mg/dL (ref <1.2)
Total Protein: 7.7 g/dL (ref 6.5–8.1)

## 2022-12-24 LAB — CBC WITH DIFFERENTIAL (CANCER CENTER ONLY)
Abs Immature Granulocytes: 0.05 10*3/uL (ref 0.00–0.07)
Basophils Absolute: 0 10*3/uL (ref 0.0–0.1)
Basophils Relative: 0 %
Eosinophils Absolute: 0 10*3/uL (ref 0.0–0.5)
Eosinophils Relative: 0 %
HCT: 39.3 % (ref 36.0–46.0)
Hemoglobin: 12.3 g/dL (ref 12.0–15.0)
Immature Granulocytes: 1 %
Lymphocytes Relative: 7 %
Lymphs Abs: 0.6 10*3/uL — ABNORMAL LOW (ref 0.7–4.0)
MCH: 26.3 pg (ref 26.0–34.0)
MCHC: 31.3 g/dL (ref 30.0–36.0)
MCV: 84 fL (ref 80.0–100.0)
Monocytes Absolute: 0.9 10*3/uL (ref 0.1–1.0)
Monocytes Relative: 10 %
Neutro Abs: 7.8 10*3/uL — ABNORMAL HIGH (ref 1.7–7.7)
Neutrophils Relative %: 82 %
Platelet Count: 283 10*3/uL (ref 150–400)
RBC: 4.68 MIL/uL (ref 3.87–5.11)
RDW: 14.9 % (ref 11.5–15.5)
WBC Count: 9.4 10*3/uL (ref 4.0–10.5)
nRBC: 0 % (ref 0.0–0.2)

## 2022-12-24 MED ORDER — NITROFURANTOIN MONOHYD MACRO 100 MG PO CAPS: 100.0000 mg | ORAL_CAPSULE | Freq: Two times a day (BID) | ORAL | 0 refills | Status: DC

## 2022-12-24 NOTE — Progress Notes (Signed)
Symptom Management Clinic [facility] at [facility] Telephone:([phone] [phone]) [phone]  Patient Care Team: Excell Seltzer, MD as PCP - General Glory Buff, RN as Oncology Nurse Navigator Orlie Dakin, Tollie Pizza, MD as Consulting Physician (Oncology)   NAME OF PATIENT: Connie West  [id number]  1955-03-07   DATE OF VISIT: 12/24/22  REASON FOR CONSULT: Connie West is a 67 y.o. female with multiple medical problems including stage IIIb adenocarcinoma of the lung on maintenance Durvalumab.   INTERVAL HISTORY: Patient presents [facility] today with complains of 5-6 days of dysuria, urinary frequency, and urgency.  She has also noticed foul smelling urine.  Denies any bleeding.  Has had suprapubic discomfort.  No back pain.  Patient had an virtual visit on 12/19/2022 with recommendation to reach out to PCP.  Patient says that her symptoms have fluctuated in severity over the last week.  She has been taking over-the-counter Azo with some improvement.  However, last evening, patient became febrile with temp of 102.2.  Patient says that she has a history of UTIs and that they feel the same as her current symptoms.  Denies any neurologic complaints.  Denies any respiratory symptoms or chest pain.  Denies any easy bleeding or bruising. Reports good appetite and denies weight loss. Denies any nausea, vomiting, constipation, or diarrhea. Denies urinary complaints. Patient offers no further specific complaints today.   PAST MEDICAL HISTORY: Past Medical History:  Diagnosis Date   Complication of anesthesia    Hyperlipidemia    PONV (postoperative nausea and vomiting)    TIA (transient ischemic attack)     PAST SURGICAL HISTORY:  Past Surgical History:  Procedure Laterality Date   BREAST CYST ASPIRATION Right 07/20/2012   FNA benign   BREAST CYST ASPIRATION Right    BREAST SURGERY Right 07-20-12   FNA benign   BRONCHIAL NEEDLE ASPIRATION BIOPSY  06/07/2022   Procedure:  BRONCHIAL NEEDLE ASPIRATION BIOPSIES;  Surgeon: Raechel Chute, MD;  Location: [facility];  Service: Pulmonary;;   CESAREAN SECTION     CHOLECYSTECTOMY     COLONOSCOPY WITH PROPOFOL N/A 03/21/2021   Procedure: COLONOSCOPY WITH PROPOFOL;  Surgeon: Toney Reil, MD;  Location: [facility] ENDOSCOPY;  Service: Gastroenterology;  Laterality: N/A;   IR IMAGING GUIDED PORT INSERTION  06/21/2022   OVARY SURGERY     VIDEO BRONCHOSCOPY WITH ENDOBRONCHIAL ULTRASOUND N/A 06/07/2022   Procedure: VIDEO BRONCHOSCOPY WITH ENDOBRONCHIAL ULTRASOUND;  Surgeon: Raechel Chute, MD;  Location: [facility]OSCOPY;  Service: Pulmonary;  Laterality: N/A;    HEMATOLOGY/ONCOLOGY HISTORY:  Oncology History  Adenocarcinoma of right lung (HCC)  06/12/2022 Initial Diagnosis   Adenocarcinoma of right lung (HCC)   06/12/2022 Cancer Staging   Staging form: Lung, AJCC 8th Edition - Clinical stage from 06/12/2022: Stage IIIB (cT2b, cN3, cM0) - Signed by Jeralyn Ruths, MD on 06/12/2022 Stage prefix: Initial diagnosis   07/03/2022 - 07/31/2022 Chemotherapy   Patient is on Treatment Plan : LUNG Carboplatin + Paclitaxel + XRT q7d     08/14/2022 -  Chemotherapy   Patient is on Treatment Plan : LUNG Durvalumab (10) q14d       ALLERGIES:  is allergic to penicillins.  MEDICATIONS:  Current Outpatient Medications  Medication Sig Dispense Refill   aspirin EC 81 MG tablet Take 81 mg by mouth in the morning.     buPROPion (WELLBUTRIN XL) 150 MG 24 hr tablet Take 1 tablet (150 mg total) by mouth in the morning. For mood and fatigue. 30  tablet 1   calcium carbonate (OS-CAL) 600 MG TABS Take 600 mg by mouth every evening.     Coenzyme Q10 (CO Q 10 PO) Take 1 capsule by mouth in the morning.     Fluocinolone Acetonide 0.01 % OIL Apply twice daily to ears as needed for rash/itching 20 mL 2   ibuprofen (ADVIL,MOTRIN) 200 MG tablet Take 400 mg by mouth every 8 (eight) hours as needed (pain.).     ketoconazole (NIZORAL) 2 % shampoo 2-3 times  per week lather on scalp and ears, leave on 8-10 minutes, rinse well (Patient taking differently: Apply 1 Application topically once a week. Once per week lather on scalp and ears, leave on 8-10 minutes, rinse well) 120 mL 5   lidocaine-prilocaine (EMLA) cream Apply 1 Application topically as needed.     MAGNESIUM PO Take 1 tablet by mouth every evening.     OMEGA-3 FATTY ACIDS PO Take 1 g by mouth every evening.     phenazopyridine (PYRIDIUM) 95 MG tablet Take 95 mg by mouth 3 (three) times daily as needed for pain.     simvastatin (ZOCOR) 40 MG tablet TAKE 1 TABLET BY MOUTH EVERY DAY 90 tablet 3   VITAMIN D PO Take 1,000 Units by mouth in the morning.     No current facility-administered medications for this visit.    VITAL SIGNS: BP 109/83   Pulse (!) 116   Temp 98.3 F (36.8 C) (Tympanic)   Resp 20   Ht 5\' 6"  (1.676 m)   Wt 157 lb (71.2 kg)   SpO2 97% Comment: RA  BMI 25.34 kg/m  Filed Weights   12/24/22 1311  Weight: 157 lb (71.2 kg)    Estimated body mass index is 25.34 kg/m as calculated from the following:   Height as of this encounter: 5\' 6"  (1.676 m).   Weight as of this encounter: 157 lb (71.2 kg).  LABS: CBC:    Component Value Date/Time   WBC 9.4 12/24/2022 1253   WBC 1.9 (L) 08/16/2022 1313   HGB 12.3 12/24/2022 1253   HCT 39.3 12/24/2022 1253   PLT 283 12/24/2022 1253   MCV 84.0 12/24/2022 1253   NEUTROABS 7.8 (H) 12/24/2022 1253   LYMPHSABS 0.6 (L) 12/24/2022 1253   MONOABS 0.9 12/24/2022 1253   EOSABS 0.0 12/24/2022 1253   BASOSABS 0.0 12/24/2022 1253   Comprehensive Metabolic Panel:    Component Value Date/Time   NA 133 (L) 12/18/2022 1256   K 3.7 12/18/2022 1256   CL 102 12/18/2022 1256   CO2 24 12/18/2022 1256   BUN 11 12/18/2022 1256   CREATININE 0.78 12/18/2022 1256   GLUCOSE 151 (H) 12/18/2022 1256   CALCIUM 9.2 12/18/2022 1256   AST 28 12/18/2022 1256   ALT 19 12/18/2022 1256   ALKPHOS 78 12/18/2022 1256   BILITOT 0.3 12/18/2022  1256   PROT 7.2 12/18/2022 1256   ALBUMIN 3.7 12/18/2022 1256    RADIOGRAPHIC STUDIES: No results found.  PERFORMANCE STATUS (ECOG) : 1 - Symptomatic but completely ambulatory  Review of Systems Unless otherwise noted, a complete review of systems is negative.  Physical Exam General: NAD Cardiovascular: regular rate and rhythm Pulmonary: clear ant fields Abdomen: soft, nontender, + bowel sounds GU: no suprapubic tenderness, no CVA tenderness Extremities: no edema, no joint deformities Skin: no rashes Neurological: nonfocal  IMPRESSION/PLAN: Stage IIIb adenocarcinoma of the lung -on maintenance Durvalumab  UTI -large amount of leukocytes and bacteria noted on urinalysis.  Urine  culture pending.  CBC and CMP are grossly unchanged from baseline.  Rx sent for Macrobid 100 mg twice daily x 5 days.  Recommended that she follow-up with PCP if no improvement.  Also reviewed ED triggers in detail today.   Patient expressed understanding and was in agreement with this plan. She also understands that She can call clinic at any time with any questions, concerns, or complaints.   Thank you for allowing me to participate in the care of this very pleasant patient.   Time Total: 15 minutes  Visit consisted of counseling and education dealing with the complex and emotionally intense issues of symptom management in the setting of serious illness.Greater than 50%  of this time was spent counseling and coordinating care related to the above assessment and plan.  Signed by: Laurette Schimke, PhD, NP-C

## 2022-12-24 NOTE — Telephone Encounter (Signed)
Patient called reporting that she had fever up to 102.2 and chills last evening and that it is uncomfortable when she urinated and there is a foul odor to her urine. She is asking if we would handle this or does she need to contact her PCP for it  I side effects on her chart that she did an E visit for Dysuria and was told to contact her PCP, but she said that she never did call Dr Ermalene Searing stating that she thought it would get better on their own. Fever and chills started Sunday night and she has been taking ibuprofen for fever and it helped her get warm

## 2022-12-24 NOTE — Telephone Encounter (Signed)
Patient called back and accepted an apt at 1pm today for further workup/evaluation

## 2022-12-24 NOTE — Telephone Encounter (Signed)
I left a vm for patient requesting a return phone call. Connie West wants to add pt for lab/ smc. cbc/metc and u/a and culture

## 2022-12-26 LAB — URINE CULTURE: Culture: >=100000 — AB

## 2023-01-01 ENCOUNTER — Inpatient Hospital Stay: Payer: Medicare Other

## 2023-01-01 VITALS — BP 124/80 | HR 83 | Temp 97.7°F | Resp 16 | Wt 157.2 lb

## 2023-01-01 DIAGNOSIS — D649 Anemia, unspecified: Secondary | ICD-10-CM | POA: Diagnosis not present

## 2023-01-01 DIAGNOSIS — C3491 Malignant neoplasm of unspecified part of right bronchus or lung: Secondary | ICD-10-CM

## 2023-01-01 DIAGNOSIS — Z79620 Long term (current) use of immunosuppressive biologic: Secondary | ICD-10-CM | POA: Diagnosis not present

## 2023-01-01 DIAGNOSIS — E785 Hyperlipidemia, unspecified: Secondary | ICD-10-CM | POA: Diagnosis not present

## 2023-01-01 DIAGNOSIS — Z87440 Personal history of urinary (tract) infections: Secondary | ICD-10-CM | POA: Diagnosis not present

## 2023-01-01 DIAGNOSIS — I700 Atherosclerosis of aorta: Secondary | ICD-10-CM | POA: Diagnosis not present

## 2023-01-01 DIAGNOSIS — E871 Hypo-osmolality and hyponatremia: Secondary | ICD-10-CM | POA: Diagnosis not present

## 2023-01-01 DIAGNOSIS — Z79899 Other long term (current) drug therapy: Secondary | ICD-10-CM | POA: Diagnosis not present

## 2023-01-01 DIAGNOSIS — Z5112 Encounter for antineoplastic immunotherapy: Secondary | ICD-10-CM | POA: Diagnosis not present

## 2023-01-01 DIAGNOSIS — G629 Polyneuropathy, unspecified: Secondary | ICD-10-CM | POA: Diagnosis not present

## 2023-01-01 DIAGNOSIS — J90 Pleural effusion, not elsewhere classified: Secondary | ICD-10-CM | POA: Diagnosis not present

## 2023-01-01 DIAGNOSIS — G4700 Insomnia, unspecified: Secondary | ICD-10-CM | POA: Diagnosis not present

## 2023-01-01 DIAGNOSIS — C3411 Malignant neoplasm of upper lobe, right bronchus or lung: Secondary | ICD-10-CM | POA: Diagnosis not present

## 2023-01-01 MED ORDER — HEPARIN SOD (PORK) LOCK FLUSH 100 UNIT/ML IV SOLN
500.0000 [IU] | Freq: Once | INTRAVENOUS | Status: AC | PRN
  Administered 2023-01-01: 500 [IU]
  Filled 2023-01-01: qty 5

## 2023-01-01 MED ORDER — SODIUM CHLORIDE 0.9 % IV SOLN
Freq: Once | INTRAVENOUS | Status: AC
  Filled 2023-01-01: qty 250

## 2023-01-01 MED ORDER — SODIUM CHLORIDE 0.9 % IV SOLN
10.0000 mg/kg | Freq: Once | INTRAVENOUS | Status: AC
  Administered 2023-01-01: 740 mg via INTRAVENOUS
  Filled 2023-01-01: qty 4.8

## 2023-01-01 NOTE — Patient Instructions (Signed)
 [facility] - A DEPT OF [facility]  Discharge Instructions: Thank you for choosing [facility] to provide your oncology and hematology care.  If you have a lab appointment with the Cancer Center, please go directly to the Cancer Center and check in at the registration area.  Wear comfortable clothing and clothing appropriate for easy access to any Portacath or PICC line.   We strive to give you quality time with your provider. You may need to reschedule your appointment if you arrive late (15 or more minutes).  Arriving late affects you and other patients whose appointments are after yours.  Also, if you miss three or more appointments without notifying the office, you may be dismissed from the clinic at the provider's discretion.      For prescription refill requests, have your pharmacy contact our office and allow 72 hours for refills to be completed.    Today you received the following chemotherapy and/or immunotherapy agents Imfinzi      To help prevent nausea and vomiting after your treatment, we encourage you to take your nausea medication as directed.  BELOW ARE SYMPTOMS THAT SHOULD BE REPORTED IMMEDIATELY: *FEVER GREATER THAN 100.4 F (38 C) OR HIGHER *CHILLS OR SWEATING *NAUSEA AND VOMITING THAT IS NOT CONTROLLED WITH YOUR NAUSEA MEDICATION *UNUSUAL SHORTNESS OF BREATH *UNUSUAL BRUISING OR BLEEDING *URINARY PROBLEMS (pain or burning when urinating, or frequent urination) *BOWEL PROBLEMS (unusual diarrhea, constipation, pain near the anus) TENDERNESS IN MOUTH AND THROAT WITH OR WITHOUT PRESENCE OF ULCERS (sore throat, sores in mouth, or a toothache) UNUSUAL RASH, SWELLING OR PAIN  UNUSUAL VAGINAL DISCHARGE OR ITCHING   Items with * indicate a potential emergency and should be followed up as soon as possible or go to the Emergency Department if any problems should occur.  Please show the CHEMOTHERAPY ALERT CARD or IMMUNOTHERAPY ALERT  CARD at check-in to the Emergency Department and triage nurse.  Should you have questions after your visit or need to cancel or reschedule your appointment, please contact [facility] - A DEPT OF [facility]  [phone] and follow the prompts.  Office hours are 8:00 a.m. to 4:30 p.m. Monday - Friday. Please note that voicemails left after 4:00 p.m. may not be returned until the following business day.  We are closed weekends and major holidays. You have access to a nurse at all times for urgent questions. Please call the main number to the clinic [phone] and follow the prompts.  For any non-urgent questions, you may also contact your provider using MyChart. We now offer e-Visits for anyone 3 and older to request care online for non-urgent symptoms. For details visit [email or web address].   Also download the MyChart app! Go to the app store, search "MyChart", open the app, select [place], and log in with your MyChart username and password.

## 2023-01-09 ENCOUNTER — Other Ambulatory Visit: Payer: Self-pay | Admitting: Nurse Practitioner

## 2023-01-13 ENCOUNTER — Ambulatory Visit
Admission: RE | Admit: 2023-01-13 | Discharge: 2023-01-13 | Disposition: A | Discharge status: Home / Self Care | Payer: Medicare Other | Source: Ambulatory Visit | Attending: Radiation Oncology | Admitting: Radiation Oncology

## 2023-01-13 ENCOUNTER — Encounter: Payer: Self-pay | Admitting: Radiation Oncology

## 2023-01-13 VITALS — BP 130/89 | HR 98 | Temp 96.9°F | Resp 14 | Ht 66.0 in | Wt 153.4 lb

## 2023-01-13 DIAGNOSIS — Z923 Personal history of irradiation: Secondary | ICD-10-CM | POA: Insufficient documentation

## 2023-01-13 DIAGNOSIS — R059 Cough, unspecified: Secondary | ICD-10-CM | POA: Insufficient documentation

## 2023-01-13 DIAGNOSIS — C778 Secondary and unspecified malignant neoplasm of lymph nodes of multiple regions: Secondary | ICD-10-CM | POA: Insufficient documentation

## 2023-01-13 DIAGNOSIS — C3411 Malignant neoplasm of upper lobe, right bronchus or lung: Secondary | ICD-10-CM | POA: Diagnosis not present

## 2023-01-13 DIAGNOSIS — Z87891 Personal history of nicotine dependence: Secondary | ICD-10-CM | POA: Diagnosis not present

## 2023-01-13 DIAGNOSIS — C3491 Malignant neoplasm of unspecified part of right bronchus or lung: Secondary | ICD-10-CM

## 2023-01-13 NOTE — Progress Notes (Signed)
Radiation Oncology Follow up Note  Name: Connie West   Date:   01/13/2023 MRN:  [id number] DOB: 02/13/1955    This 67 y.o. female presents to the clinic today for 53-month follow-up status post palliative radiation therapy for initial stage IIIb adenocarcinoma the right lung currently on maintenance dose value Mab.  REFERRING PROVIDER: Excell Seltzer, MD  HPI: A 67 year old patient with a history of stage 3B adenocarcinoma of the right lung, currently on maintenance durvalumab, presents for a routine follow-up visit. The patient recently underwent palliative radiation therapy and was treated for a urinary tract infection (UTI). A PET CT scan in September showed a mildly decreased size of the previously characterized right upper lobe lung mass and improvement in previous avid nodal metastatic disease within the neck and chest. The patient reports a mild cough but denies hemoptysis or chest tightness. The patient is tolerating the immunotherapy well..  COMPLICATIONS OF TREATMENT: none  FOLLOW UP COMPLIANCE: keeps appointments   PHYSICAL EXAM:  BP 130/89   Pulse 98   Temp (!) 96.9 F (36.1 C)   Resp 14   Ht 5\' 6"  (1.676 m)   Wt 153 lb 6.4 oz (69.6 kg)   BMI 24.76 kg/m  Well-developed well-nourished patient in NAD. HEENT reveals PERLA, EOMI, discs not visualized.  Oral cavity is clear. No oral mucosal lesions are identified. Neck is clear without evidence of cervical or supraclavicular adenopathy. Lungs are clear to A&P. Cardiac examination is essentially unremarkable with regular rate and rhythm without murmur rub or thrill. Abdomen is benign with no organomegaly or masses noted. Motor sensory and DTR levels are equal and symmetric in the upper and lower extremities. Cranial nerves II through XII are grossly intact. Proprioception is intact. No peripheral adenopathy or edema is identified. No motor or sensory levels are noted. Crude visual fields are within normal range.  RADIOLOGY RESULTS:  PET CT scan reviewed compatible with above-stated findings RADIOLOGY PET CT: Previously characterized right upper lobe lung mass showing mildly decreased size and similar degree of hypermetabolic activity. Improvement in previous avid nodal metastatic disease within the neck and chest. (10/2022)  PLAN: Stage IIIB Adenocarcinoma of the Right Lung Tolerating maintenance durvalumab well. Recent PET CT scan showed a mild decrease in size of the right upper lobe lung mass and improvement in previous avid nodal metastatic disease within the neck and chest. Mild cough, no hemoptysis or chest tightness. -Continue durvalumab. -Follow up in six months or sooner if palliative treatment is needed. -Advise patient to call with any concerns.    [name], MD

## 2023-01-14 ENCOUNTER — Other Ambulatory Visit: Payer: Self-pay

## 2023-01-15 ENCOUNTER — Encounter: Payer: Self-pay | Admitting: Oncology

## 2023-01-15 ENCOUNTER — Inpatient Hospital Stay: Payer: Medicare Other | Attending: Oncology

## 2023-01-15 ENCOUNTER — Telehealth: Payer: Self-pay | Admitting: *Deleted

## 2023-01-15 ENCOUNTER — Inpatient Hospital Stay: Payer: Medicare Other

## 2023-01-15 ENCOUNTER — Inpatient Hospital Stay (HOSPITAL_BASED_OUTPATIENT_CLINIC_OR_DEPARTMENT_OTHER): Payer: Medicare Other | Admitting: Oncology

## 2023-01-15 ENCOUNTER — Other Ambulatory Visit: Payer: Self-pay | Admitting: *Deleted

## 2023-01-15 VITALS — BP 135/90 | HR 93 | Temp 97.0°F | Resp 16 | Ht 66.0 in | Wt 152.8 lb

## 2023-01-15 DIAGNOSIS — G4700 Insomnia, unspecified: Secondary | ICD-10-CM | POA: Diagnosis not present

## 2023-01-15 DIAGNOSIS — R300 Dysuria: Secondary | ICD-10-CM | POA: Diagnosis not present

## 2023-01-15 DIAGNOSIS — G629 Polyneuropathy, unspecified: Secondary | ICD-10-CM | POA: Insufficient documentation

## 2023-01-15 DIAGNOSIS — E871 Hypo-osmolality and hyponatremia: Secondary | ICD-10-CM | POA: Insufficient documentation

## 2023-01-15 DIAGNOSIS — C3491 Malignant neoplasm of unspecified part of right bronchus or lung: Secondary | ICD-10-CM

## 2023-01-15 DIAGNOSIS — N390 Urinary tract infection, site not specified: Secondary | ICD-10-CM

## 2023-01-15 DIAGNOSIS — D649 Anemia, unspecified: Secondary | ICD-10-CM | POA: Insufficient documentation

## 2023-01-15 DIAGNOSIS — Z5112 Encounter for antineoplastic immunotherapy: Secondary | ICD-10-CM | POA: Diagnosis not present

## 2023-01-15 DIAGNOSIS — Z79899 Other long term (current) drug therapy: Secondary | ICD-10-CM | POA: Insufficient documentation

## 2023-01-15 DIAGNOSIS — C3411 Malignant neoplasm of upper lobe, right bronchus or lung: Secondary | ICD-10-CM | POA: Insufficient documentation

## 2023-01-15 LAB — CMP (CANCER CENTER ONLY)
ALT: 19 U/L (ref 0–44)
AST: 27 U/L (ref 15–41)
Albumin: 3.7 g/dL (ref 3.5–5.0)
Alkaline Phosphatase: 78 U/L (ref 38–126)
Anion gap: 8 (ref 5–15)
BUN: 17 mg/dL (ref 8–23)
CO2: 24 mmol/L (ref 22–32)
Calcium: 9.3 mg/dL (ref 8.9–10.3)
Chloride: 105 mmol/L (ref 98–111)
Creatinine: 0.81 mg/dL (ref 0.44–1.00)
GFR, Estimated: >60 mL/min (ref >60)
Glucose, Bld: 113 mg/dL — ABNORMAL HIGH (ref 70–99)
Potassium: 3.7 mmol/L (ref 3.5–5.1)
Sodium: 137 mmol/L (ref 135–145)
Total Bilirubin: 0.6 mg/dL (ref <1.2)
Total Protein: 7.5 g/dL (ref 6.5–8.1)

## 2023-01-15 LAB — URINALYSIS, COMPLETE (UACMP) WITH MICROSCOPIC
Bilirubin Urine: NEGATIVE
Glucose, UA: NEGATIVE mg/dL
Hgb urine dipstick: NEGATIVE
Ketones, ur: NEGATIVE mg/dL
Nitrite: POSITIVE — AB
Protein, ur: NEGATIVE mg/dL
Specific Gravity, Urine: 1.009 (ref 1.005–1.030)
WBC, UA: >50 WBC/hpf (ref 0–5)
pH: 6 (ref 5.0–8.0)

## 2023-01-15 LAB — CBC WITH DIFFERENTIAL (CANCER CENTER ONLY)
Abs Immature Granulocytes: 0.02 10*3/uL (ref 0.00–0.07)
Basophils Absolute: 0 10*3/uL (ref 0.0–0.1)
Basophils Relative: 0 %
Eosinophils Absolute: 0.1 10*3/uL (ref 0.0–0.5)
Eosinophils Relative: 2 %
HCT: 37.2 % (ref 36.0–46.0)
Hemoglobin: 11.9 g/dL — ABNORMAL LOW (ref 12.0–15.0)
Immature Granulocytes: 0 %
Lymphocytes Relative: 15 %
Lymphs Abs: 0.8 10*3/uL (ref 0.7–4.0)
MCH: 26.4 pg (ref 26.0–34.0)
MCHC: 32 g/dL (ref 30.0–36.0)
MCV: 82.7 fL (ref 80.0–100.0)
Monocytes Absolute: 0.6 10*3/uL (ref 0.1–1.0)
Monocytes Relative: 10 %
Neutro Abs: 4 10*3/uL (ref 1.7–7.7)
Neutrophils Relative %: 73 %
Platelet Count: 292 10*3/uL (ref 150–400)
RBC: 4.5 MIL/uL (ref 3.87–5.11)
RDW: 15.2 % (ref 11.5–15.5)
WBC Count: 5.4 10*3/uL (ref 4.0–10.5)
nRBC: 0 % (ref 0.0–0.2)

## 2023-01-15 LAB — TSH: TSH: 2.042 u[IU]/mL (ref 0.350–4.500)

## 2023-01-15 MED ORDER — SODIUM CHLORIDE 0.9% FLUSH
10.0000 mL | INTRAVENOUS | Status: DC | PRN
  Administered 2023-01-15: 10 mL
  Filled 2023-01-15: qty 10

## 2023-01-15 MED ORDER — SODIUM CHLORIDE 0.9% FLUSH
10.0000 mL | Freq: Once | INTRAVENOUS | Status: AC
  Administered 2023-01-15: 10 mL via INTRAVENOUS
  Filled 2023-01-15: qty 10

## 2023-01-15 MED ORDER — SODIUM CHLORIDE 0.9 % IV SOLN
Freq: Once | INTRAVENOUS | Status: AC
Start: 2023-01-15 — End: 2023-01-15
  Filled 2023-01-15: qty 250

## 2023-01-15 MED ORDER — HEPARIN SOD (PORK) LOCK FLUSH 100 UNIT/ML IV SOLN
500.0000 [IU] | Freq: Once | INTRAVENOUS | Status: DC | PRN
  Filled 2023-01-15: qty 5

## 2023-01-15 MED ORDER — HEPARIN SOD (PORK) LOCK FLUSH 100 UNIT/ML IV SOLN
500.0000 [IU] | Freq: Once | INTRAVENOUS | Status: AC
  Administered 2023-01-15: 500 [IU] via INTRAVENOUS
  Filled 2023-01-15: qty 5

## 2023-01-15 MED ORDER — SODIUM CHLORIDE 0.9 % IV SOLN
10.0000 mg/kg | Freq: Once | INTRAVENOUS | Status: AC
  Administered 2023-01-15: 740 mg via INTRAVENOUS
  Filled 2023-01-15: qty 4.8

## 2023-01-15 MED ORDER — SULFAMETHOXAZOLE-TRIMETHOPRIM 800-160 MG PO TABS: 1.0000 | ORAL_TABLET | Freq: Two times a day (BID) | ORAL | 0 refills | Status: DC

## 2023-01-15 NOTE — Progress Notes (Signed)
Having urinary odor. States UTI symptoms got better and finished her antibiotic Josh prescribed on 11/12 but the odor has came back within the last 2 days. No there symptoms noted.

## 2023-01-15 NOTE — Telephone Encounter (Signed)
-----   Message from Jeralyn Ruths sent at 01/15/2023 11:48 AM EST ----- Looks like her UTI did not resolve.  Lets put her on Bactrim twice daily x 7 days.  Thanks! ----- Message ----- From: Leory Plowman, Lab In [place] Sent: 01/15/2023   9:24 AM EST To: Jeralyn Ruths, MD

## 2023-01-15 NOTE — Telephone Encounter (Signed)
Left vm for patient regarding UA results and recommendations for antibiotics x 7 more days. Prescription for Bactrim sent to patients pharmacy.

## 2023-01-15 NOTE — Patient Instructions (Signed)
[facility] - [facility]  Discharge Instructions: Thank you for choosing [facility] to provide your oncology and hematology care.  If you have a lab appointment with the Cancer Center, please go directly to the Cancer Center and check in at the registration area.  Wear comfortable clothing and clothing appropriate for easy access to any Portacath or PICC line.   We strive to give you quality time with your provider. You may need to reschedule your appointment if you arrive late (15 or more minutes).  Arriving late affects you and other patients whose appointments are after yours.  Also, if you miss three or more appointments without notifying the office, you may be dismissed from the clinic at the provider's discretion.      For prescription refill requests, have your pharmacy contact our office and allow 72 hours for refills to be completed.    Today you received the following chemotherapy and/or immunotherapy agents- imfinzi      To help prevent nausea and vomiting after your treatment, we encourage you to take your nausea medication as directed.  BELOW ARE SYMPTOMS THAT SHOULD BE REPORTED IMMEDIATELY: *FEVER GREATER THAN 100.4 F (38 C) OR HIGHER *CHILLS OR SWEATING *NAUSEA AND VOMITING THAT IS NOT CONTROLLED WITH YOUR NAUSEA MEDICATION *UNUSUAL SHORTNESS OF BREATH *UNUSUAL BRUISING OR BLEEDING *URINARY PROBLEMS (pain or burning when urinating, or frequent urination) *BOWEL PROBLEMS (unusual diarrhea, constipation, pain near the anus) TENDERNESS IN MOUTH AND THROAT WITH OR WITHOUT PRESENCE OF ULCERS (sore throat, sores in mouth, or a toothache) UNUSUAL RASH, SWELLING OR PAIN  UNUSUAL VAGINAL DISCHARGE OR ITCHING   Items with * indicate a potential emergency and should be followed up as soon as possible or go to the Emergency Department if any problems should occur.  Please show the CHEMOTHERAPY ALERT CARD or IMMUNOTHERAPY  ALERT CARD at check-in to the Emergency Department and triage nurse.  Should you have questions after your visit or need to cancel or reschedule your appointment, please contact [facility] - A DEPT OF [facility]  [phone] and follow the prompts.  Office hours are 8:00 a.m. to 4:30 p.m. Monday - Friday. Please note that voicemails left after 4:00 p.m. may not be returned until the following business day.  We are closed weekends and major holidays. You have access to a nurse at all times for urgent questions. Please call the main number to the clinic [phone] and follow the prompts.  For any non-urgent questions, you may also contact your provider using MyChart. We now offer e-Visits for anyone 49 and older to request care online for non-urgent symptoms. For details visit [email or web address].   Also download the MyChart app! Go to the app store, search "MyChart", open the app, select [place], and log in with your MyChart username and password.

## 2023-01-15 NOTE — Progress Notes (Signed)
[facility]  [phone] [phone]) [phone]  ID: Connie West OB: October 24, 1955  MR#: [id number]  QQV#:[id number]  Patient Care Team: Excell Seltzer, MD as PCP - General Glory Buff, RN as Oncology Nurse Navigator Orlie Dakin, Tollie Pizza, MD as Consulting Physician (Oncology)  CHIEF COMPLAINT: Stage IIIb adenocarcinoma of the lung.  INTERVAL HISTORY: Patient returns to clinic today for further evaluation and consideration of cycle 12 of maintenance durvalumab.  Her UTI symptoms have resolved, but over the past 2 days she has noticed an increased odor of her urine but no other symptoms.  She otherwise feels well and is tolerating her treatments without significant side effects. She has a mild peripheral neuropathy, that does not affect her day-to-day activity.  She has no other neurologic complaints.  She denies any recent fevers or illnesses.  She has a good appetite and denies weight loss.  She has no chest pain, shortness of breath, cough, or hemoptysis.  She denies any nausea, vomiting, constipation, or diarrhea.  She has no urinary complaints.  Patient offers no further specific complaints today.  REVIEW OF SYSTEMS:   Review of Systems  Constitutional:  Positive for malaise/fatigue. Negative for fever and weight loss.  Respiratory: Negative.  Negative for cough, hemoptysis and shortness of breath.   Cardiovascular: Negative.  Negative for chest pain and leg swelling.  Gastrointestinal: Negative.  Negative for abdominal pain.  Genitourinary: Negative.  Negative for dysuria.  Musculoskeletal: Negative.  Negative for back pain and joint pain.  Skin: Negative.  Negative for rash.  Neurological:  Positive for tingling and sensory change. Negative for dizziness, focal weakness, weakness and headaches.  Psychiatric/Behavioral: Negative.  The patient is not nervous/anxious.     As per HPI. Otherwise, a complete review of systems is negative.  PAST MEDICAL  HISTORY: Past Medical History:  Diagnosis Date   Complication of anesthesia    Hyperlipidemia    PONV (postoperative nausea and vomiting)    TIA (transient ischemic attack)     PAST SURGICAL HISTORY: Past Surgical History:  Procedure Laterality Date   BREAST CYST ASPIRATION Right 07/20/2012   FNA benign   BREAST CYST ASPIRATION Right    BREAST SURGERY Right 07-20-12   FNA benign   BRONCHIAL NEEDLE ASPIRATION BIOPSY  06/07/2022   Procedure: BRONCHIAL NEEDLE ASPIRATION BIOPSIES;  Surgeon: Raechel Chute, MD;  Location: [facility];  Service: Pulmonary;;   CESAREAN SECTION     CHOLECYSTECTOMY     COLONOSCOPY WITH PROPOFOL N/A 03/21/2021   Procedure: COLONOSCOPY WITH PROPOFOL;  Surgeon: Toney Reil, MD;  Location: [facility] ENDOSCOPY;  Service: Gastroenterology;  Laterality: N/A;   IR IMAGING GUIDED PORT INSERTION  06/21/2022   OVARY SURGERY     VIDEO BRONCHOSCOPY WITH ENDOBRONCHIAL ULTRASOUND N/A 06/07/2022   Procedure: VIDEO BRONCHOSCOPY WITH ENDOBRONCHIAL ULTRASOUND;  Surgeon: Raechel Chute, MD;  Location: [facility] ENDOSCOPY;  Service: Pulmonary;  Laterality: N/A;    FAMILY HISTORY: Family History  Problem Relation Age of Onset   Breast cancer Mother 21   Cancer Mother        breast   Cancer Father        bone cancer    ADVANCED DIRECTIVES (Y/N):  N  HEALTH MAINTENANCE: Social History   Tobacco Use   Smoking status: Former    Current packs/day: 0.00    Average packs/day: 0.6 packs/day for 43.0 years (26.7 ttl pk-yrs)    Types: Cigarettes    Start date: 61    Quit date: 2016  Years since quitting: 8.9   Smokeless tobacco: Never   Tobacco comments:    quit x 1 month 11/16  Vaping Use   Vaping status: Never Used  Substance Use Topics   Alcohol use: Yes    Comment: occasional : 1x/week   Drug use: No     Colonoscopy:  PAP:  Bone density:  Lipid panel:  Allergies  Allergen Reactions   Penicillins Hives    Current Outpatient Medications  Medication Sig  Dispense Refill   aspirin EC 81 MG tablet Take 81 mg by mouth in the morning.     buPROPion (WELLBUTRIN XL) 150 MG 24 hr tablet Take 1 tablet (150 mg total) by mouth in the morning. For mood and fatigue. 30 tablet 1   calcium carbonate (OS-CAL) 600 MG TABS Take 600 mg by mouth every evening.     Coenzyme Q10 (CO Q 10 PO) Take 1 capsule by mouth in the morning.     Fluocinolone Acetonide 0.01 % OIL Apply twice daily to ears as needed for rash/itching 20 mL 2   ibuprofen (ADVIL,MOTRIN) 200 MG tablet Take 400 mg by mouth every 8 (eight) hours as needed (pain.).     ketoconazole (NIZORAL) 2 % shampoo 2-3 times per week lather on scalp and ears, leave on 8-10 minutes, rinse well (Patient taking differently: Apply 1 Application topically once a week. Once per week lather on scalp and ears, leave on 8-10 minutes, rinse well) 120 mL 5   lidocaine-prilocaine (EMLA) cream Apply 1 Application topically as needed.     MAGNESIUM PO Take 1 tablet by mouth every evening.     OMEGA-3 FATTY ACIDS PO Take 1 g by mouth every evening.     phenazopyridine (PYRIDIUM) 95 MG tablet Take 95 mg by mouth 3 (three) times daily as needed for pain.     simvastatin (ZOCOR) 40 MG tablet TAKE 1 TABLET BY MOUTH EVERY DAY 90 tablet 3   VITAMIN D PO Take 1,000 Units by mouth in the morning.     nitrofurantoin, macrocrystal-monohydrate, (MACROBID) 100 MG capsule Take 1 capsule (100 mg total) by mouth 2 (two) times daily. (Patient not taking: Reported on 01/15/2023) 10 capsule 0   No current facility-administered medications for this visit.   Facility-Administered Medications Ordered in Other Visits  Medication Dose Route Frequency Provider Last Rate Last Admin   heparin lock flush 100 unit/mL  500 Units Intravenous Once Jeralyn Ruths, MD        OBJECTIVE: Vitals:   01/15/23 0908  BP: (!) 135/90  Pulse: 93  Resp: 16  Temp: (!) 97 F (36.1 C)  SpO2: 98%     Body mass index is 24.66 kg/m.    ECOG FS:0 -  Asymptomatic  General: Well-developed, well-nourished, no acute distress. Eyes: Pink conjunctiva, anicteric sclera. HEENT: Normocephalic, moist mucous membranes. Lungs: No audible wheezing or coughing. Heart: Regular rate and rhythm. Abdomen: Soft, nontender, no obvious distention. Musculoskeletal: No edema, cyanosis, or clubbing. Neuro: Alert, answering all questions appropriately. Cranial nerves grossly intact. Skin: No rashes or petechiae noted. Psych: Normal affect.  LAB RESULTS:  Lab Results  Component Value Date   NA 134 (L) 12/24/2022   K 3.4 (L) 12/24/2022   CL 99 12/24/2022   CO2 23 12/24/2022   GLUCOSE 125 (H) 12/24/2022   BUN 21 12/24/2022   CREATININE 0.89 12/24/2022   CALCIUM 9.3 12/24/2022   PROT 7.7 12/24/2022   ALBUMIN 3.8 12/24/2022   AST 29 12/24/2022  ALT 20 12/24/2022   ALKPHOS 82 12/24/2022   BILITOT 1.0 12/24/2022   GFRNONAA >60 12/24/2022   GFRAA >60 01/04/2018    Lab Results  Component Value Date   WBC 5.4 01/15/2023   NEUTROABS 4.0 01/15/2023   HGB 11.9 (L) 01/15/2023   HCT 37.2 01/15/2023   MCV 82.7 01/15/2023   PLT 292 01/15/2023     STUDIES: No results found.  ASSESSMENT: Stage IIIb adenocarcinoma of the lung.  PLAN:    Stage IIIb adenocarcinoma of the lung: Biopsy from bronchoscopy on June 07, 2022 confirming the diagnosis.  PET scan results from May 22, 2022 reviewed independently confirming stage of disease.  The bilateral hypermetabolic cervical lymph nodes are suspicious, but will treat patient as a stage IIIb.  MRI of the brain on Jun 19, 2022 did not reveal any metastatic disease.  Patient completed weekly carboplatin and Taxol on July 31, 2022.  Patient now receiving maintenance durvalumab every 2 weeks for 1 year.  She initiated maintenance treatment on August 14, 2022.  PET scan results from November 20, 2022 reviewed independently with interval improvement of disease along with extensive posttreatment changes.  Proceed with  cycle 12 of treatment today.  Return to clinic in 2 weeks for treatment only and then in 4 weeks for further evaluation and consideration of cycle 14.   UTI: Resolved with Macrobid, but patient is now having mild symptoms therefore will repeat UA and culture today. Peripheral neuropathy: Chronic and unchanged. Hyponatremia: Chronic and unchanged. Anemia: Nearly resolved.  Monitor. Insomnia: Patient does not complain of this today.  Continue Xanax as needed.   Patient expressed understanding and was in agreement with this plan. She also understands that She can call clinic at any time with any questions, concerns, or complaints.    Cancer Staging  Adenocarcinoma of right lung [facility]) Staging form: Lung, AJCC 8th Edition - Clinical stage from 06/12/2022: Stage IIIB (cT2b, cN3, cM0) - Signed by Jeralyn Ruths, MD on 06/12/2022 Stage prefix: Initial diagnosis   Jeralyn Ruths, MD   01/15/2023 9:37 AM

## 2023-01-16 LAB — T4: T4, Total: 9.5 ug/dL (ref 4.5–12.0)

## 2023-01-17 ENCOUNTER — Telehealth: Payer: Self-pay | Admitting: *Deleted

## 2023-01-17 DIAGNOSIS — R7303 Prediabetes: Secondary | ICD-10-CM

## 2023-01-17 DIAGNOSIS — E7800 Pure hypercholesterolemia, unspecified: Secondary | ICD-10-CM

## 2023-01-17 DIAGNOSIS — E559 Vitamin D deficiency, unspecified: Secondary | ICD-10-CM

## 2023-01-17 LAB — URINE CULTURE: Culture: >=100000 — AB

## 2023-01-17 NOTE — Telephone Encounter (Signed)
-----   Message from Alvina Chou sent at 01/17/2023  2:55 PM EST ----- Regarding: Lab orders for Connie West, 12.26.24 Patient is scheduled for CPX labs, please order future labs, Thanks , Camelia Eng

## 2023-01-23 ENCOUNTER — Ambulatory Visit: Payer: Medicare Other

## 2023-01-23 VITALS — Ht 66.0 in | Wt 152.0 lb

## 2023-01-23 DIAGNOSIS — Z1231 Encounter for screening mammogram for malignant neoplasm of breast: Secondary | ICD-10-CM | POA: Diagnosis not present

## 2023-01-23 DIAGNOSIS — Z0000 Encounter for general adult medical examination without abnormal findings: Secondary | ICD-10-CM

## 2023-01-23 NOTE — Patient Instructions (Signed)
Connie West , Thank you for taking time to come for your Medicare Wellness Visit. I appreciate your ongoing commitment to your health goals. Please review the following plan we discussed and let me know if I can assist you in the future.   Referrals/Orders/Follow-Ups/Clinician Recommendations:   You have an order for:  []   2D Mammogram  [x]   3D Mammogram  []   Bone Density     Please call for appointment:  [facility]  [address] [place] [address] [phone]  [facility] [address] [place], [place] [address] [phone]  [facility] [address] Geanie [place], [place] [address] [phone]    Make sure to wear two-piece clothing.  No lotions, powders, or deodorants the day of the appointment. Make sure to bring picture ID and insurance card.  Bring list of medications you are currently taking including any supplements.   Schedule your [facility] screening mammogram through MyChart!   Log into your MyChart account.  Go to 'Visit' (or 'Appointments' if on mobile App) --> Schedule an Appointment  Under 'Select a Reason for Visit' choose the Mammogram Screening option.  Complete the pre-visit questions and select the time and place that best fits your schedule.    This is a list of the screening recommended for you and due dates:  Health Maintenance  Topic Date Due   Colon Cancer Screening  03/21/2022   COVID-19 Vaccine (5 - 2024-25 season) 10/13/2022   Flu Shot  06/11/2023*   Mammogram  03/09/2023   Medicare Annual Wellness Visit  01/23/2024   DEXA scan (bone density measurement)  03/09/2027   DTaP/Tdap/Td vaccine (3 - Td or Tdap) 01/20/2029   Pneumonia Vaccine  Completed   Hepatitis C Screening  Completed   Zoster (Shingles) Vaccine  Completed   HPV Vaccine  Aged Out   Screening for Lung Cancer  Discontinued  *Topic  was postponed. The date shown is not the original due date.    Advanced directives: (Declined) Advance directive discussed with you today. Even though you declined this today, please call our office should you change your mind, and we can give you the proper paperwork for you to fill out.  Next Medicare Annual Wellness Visit scheduled for next year: Yes 01/26/24 @ 1pm telephone

## 2023-01-23 NOTE — Progress Notes (Signed)
Subjective:   Connie West is a 67 y.o. female who presents for Medicare Annual (Subsequent) preventive examination.  Visit Complete: Virtual I connected with  Connie West on 01/23/23 by a audio enabled telemedicine application and verified that I am speaking with the correct person using two identifiers.  Patient Location: Home  Provider Location: Home Office  I discussed the limitations of evaluation and management by telemedicine. The patient expressed understanding and agreed to proceed.  Vital Signs: Because this visit was a virtual/telehealth visit, some criteria may be missing or patient reported. Any vitals not documented were not able to be obtained and vitals that have been documented are patient reported.  Patient Medicare AWV questionnaire was completed by the patient on 01/22/23; I have confirmed that all information answered by patient is correct and no changes since this date.  Cardiac Risk Factors include: advanced age (>47men, >50 women);dyslipidemia;sedentary lifestyle     Objective:    Today's Vitals   01/23/23 1307  Weight: 152 lb (68.9 kg)  Height: 5\' 6"  (1.676 m)  PainSc: 0-No pain   Body mass index is 24.53 kg/m.     01/23/2023    1:14 PM 01/15/2023    9:11 AM 01/13/2023   10:09 AM 01/13/2023   10:05 AM 12/24/2022    1:12 PM 12/18/2022    1:07 PM 10/23/2022    9:33 AM  Advanced Directives  Does Patient Have a Medical Advance Directive? No No No No No No No  Does patient want to make changes to medical advance directive?  No - Patient declined       Would patient like information on creating a medical advance directive?   No - Patient declined No - Patient declined No - Patient declined  No - Patient declined    Current Medications (verified) Outpatient Encounter Medications as of 01/23/2023  Medication Sig   aspirin EC 81 MG tablet Take 81 mg by mouth in the morning.   buPROPion (WELLBUTRIN XL) 150 MG 24 hr tablet TAKE 1 TABLET BY MOUTH IN THE  MORNING. FOR MOOD AND FATIGUE.   calcium carbonate (OS-CAL) 600 MG TABS Take 600 mg by mouth every evening.   Coenzyme Q10 (CO Q 10 PO) Take 1 capsule by mouth in the morning.   Fluocinolone Acetonide 0.01 % OIL Apply twice daily to ears as needed for rash/itching   ibuprofen (ADVIL,MOTRIN) 200 MG tablet Take 400 mg by mouth every 8 (eight) hours as needed (pain.).   ketoconazole (NIZORAL) 2 % shampoo 2-3 times per week lather on scalp and ears, leave on 8-10 minutes, rinse well (Patient taking differently: Apply 1 Application topically once a week. Once per week lather on scalp and ears, leave on 8-10 minutes, rinse well)   lidocaine-prilocaine (EMLA) cream Apply 1 Application topically as needed.   MAGNESIUM PO Take 1 tablet by mouth every evening.   OMEGA-3 FATTY ACIDS PO Take 1 g by mouth every evening.   simvastatin (ZOCOR) 40 MG tablet TAKE 1 TABLET BY MOUTH EVERY DAY   sulfamethoxazole-trimethoprim (BACTRIM DS) 800-160 MG tablet Take 1 tablet by mouth 2 (two) times daily. (Patient not taking: Reported on 01/23/2023)   VITAMIN D PO Take 1,000 Units by mouth in the morning.   nitrofurantoin, macrocrystal-monohydrate, (MACROBID) 100 MG capsule Take 1 capsule (100 mg total) by mouth 2 (two) times daily. (Patient not taking: Reported on 01/23/2023)   phenazopyridine (PYRIDIUM) 95 MG tablet Take 95 mg by mouth 3 (three) times daily  as needed for pain. (Patient not taking: Reported on 01/23/2023)   No facility-administered encounter medications on file as of 01/23/2023.    Allergies (verified) Penicillins   History: Past Medical History:  Diagnosis Date   Complication of anesthesia    Hyperlipidemia    PONV (postoperative nausea and vomiting)    TIA (transient ischemic attack)    Past Surgical History:  Procedure Laterality Date   BREAST CYST ASPIRATION Right 07/20/2012   FNA benign   BREAST CYST ASPIRATION Right    BREAST SURGERY Right 07-20-12   FNA benign   BRONCHIAL NEEDLE  ASPIRATION BIOPSY  06/07/2022   Procedure: BRONCHIAL NEEDLE ASPIRATION BIOPSIES;  Surgeon: Raechel Chute, MD;  Location: [facility];  Service: Pulmonary;;   CESAREAN SECTION     CHOLECYSTECTOMY     COLONOSCOPY WITH PROPOFOL N/A 03/21/2021   Procedure: COLONOSCOPY WITH PROPOFOL;  Surgeon: Toney Reil, MD;  Location: [facility];  Service: Gastroenterology;  Laterality: N/A;   IR IMAGING GUIDED PORT INSERTION  06/21/2022   OVARY SURGERY     VIDEO BRONCHOSCOPY WITH ENDOBRONCHIAL ULTRASOUND N/A 06/07/2022   Procedure: VIDEO BRONCHOSCOPY WITH ENDOBRONCHIAL ULTRASOUND;  Surgeon: Raechel Chute, MD;  Location: [facility];  Service: Pulmonary;  Laterality: N/A;   Family History  Problem Relation Age of Onset   Breast cancer Mother 66   Cancer Mother        breast   Cancer Father        bone cancer   Social History   Socioeconomic History   Marital status: Divorced    Spouse name: Not on file   Number of children: 2   Years of education: Not on file   Highest education level: Not on file  Occupational History   Occupation: Estate agent: [employer]  Tobacco Use   Smoking status: Former    Current packs/day: 0.00    Average packs/day: 0.6 packs/day for 43.0 years (26.7 ttl pk-yrs)    Types: Cigarettes    Start date: 78    Quit date: 2016    Years since quitting: 8.9   Smokeless tobacco: Never   Tobacco comments:    quit x 1 month 11/16  Vaping Use   Vaping status: Never Used  Substance and Sexual Activity   Alcohol use: Yes    Comment: occasional : 1x/week   Drug use: No   Sexual activity: Not on file  Other Topics Concern   Not on file  Social History Narrative   From [place]         Social Drivers of Health   Financial Resource Strain: Low Risk  (01/22/2023)   Overall Financial Resource Strain (CARDIA)    Difficulty of Paying Living Expenses: Not hard at all  Food Insecurity: No Food Insecurity (01/22/2023)   Hunger Vital Sign    Worried  About Running Out of Food in the Last Year: Never true    Ran Out of Food in the Last Year: Never true  Transportation Needs: No Transportation Needs (01/22/2023)   PRAPARE - Administrator, Civil Service (Medical): No    Lack of Transportation (Non-Medical): No  Physical Activity: Inactive (01/22/2023)   Exercise Vital Sign    Days of Exercise per Week: 0 days    Minutes of Exercise per Session: 0 min  Stress: No Stress Concern Present (01/22/2023)   [employer] of Occupational Health - Occupational Stress Questionnaire    Feeling of Stress : Only a little  Social Connections: Unknown (01/22/2023)   Social Connection and Isolation Panel [NHANES]    Frequency of Communication with Friends and Family: More than three times a week    Frequency of Social Gatherings with Friends and Family: Three times a week    Attends Religious Services: Not on file    Active Member of Clubs or Organizations: No    Attends Banker Meetings: Never    Marital Status: Divorced    Tobacco Counseling Counseling given: Not Answered Tobacco comments: quit x 1 month 11/16   Clinical Intake:  Pre-visit preparation completed: Yes  Pain : No/denies pain Pain Score: 0-No pain    BMI - recorded: 24.53 Nutritional Status: BMI of 19-24  Normal Nutritional Risks: None Diabetes: No  How often do you need to have someone help you when you read instructions, pamphlets, or other written materials from your doctor or pharmacy?: 1 - Never  Interpreter Needed?: No  Comments: lives alone Information entered by :: B.Gwendelyn Lanting,LPN   Activities of Daily Living    01/22/2023    2:31 PM 06/21/2022   10:28 AM  In your present state of health, do you have any difficulty performing the following activities:  Hearing? 0 0  Vision? 0 0  Difficulty concentrating or making decisions? 0 0  Walking or climbing stairs? 0 1  Dressing or bathing? 0 0  Doing errands, shopping? 0    Preparing Food and eating ? N   Using the Toilet? N   In the past six months, have you accidently leaked urine? N   Do you have problems with loss of bowel control? N   Managing your Medications? N   Managing your Finances? N   Housekeeping or managing your Housekeeping? N     Patient Care Team: Excell Seltzer, MD as PCP - General Glory Buff, RN as Oncology Nurse Navigator Orlie Dakin, Tollie Pizza, MD as Consulting Physician (Oncology) Pa, [facility]  Indicate any recent Medical Services you may have received from other than [facility] providers in the past year (date may be approximate).     Assessment:   This is a routine wellness examination for Suzan.  Hearing/Vision screen Hearing Screening - Comments:: Hearing is good per pt Vision Screening - Comments:: Pt says her vision is good w/glasses [name]   Goals Addressed             This Visit's Progress    Patient Stated       I would like to get back to being more active and have more energy after immunotherapy.       Depression Screen    01/23/2023    1:12 PM 02/01/2022    9:24 AM 02/01/2021    8:45 AM 01/25/2020    8:40 AM 01/21/2019    9:25 AM 10/17/2017   11:22 AM 10/08/2016   11:14 AM  PHQ 2/9 Scores  PHQ - 2 Score 0 0 0 0 0 0 0    Fall Risk    01/23/2023    1:09 PM 01/22/2023    2:31 PM 02/01/2022    9:24 AM 02/01/2021    8:45 AM 01/25/2020    8:40 AM  Fall Risk   Falls in the past year? 0 0  1 0 0  Number falls in past yr: 0  0    Injury with Fall? 0  1    Risk for fall due to : No Fall Risks  Follow up Education provided;Falls prevention discussed    Falls evaluation completed     Patient-reported    MEDICARE RISK AT HOME: Medicare Risk at Home Any stairs in or around the home?: No If so, are there any without handrails?: Yes Home free of loose throw rugs in walkways, pet beds, electrical cords, etc?: Yes Adequate lighting in your home to reduce risk of falls?: Yes Life  alert?: No Use of a cane, walker or w/c?: No Grab bars in the bathroom?: No Shower chair or bench in shower?: No Elevated toilet seat or a handicapped toilet?: No  TIMED UP AND GO:  Was the test performed?  No    Cognitive Function:        01/23/2023    1:16 PM  6CIT Screen  What Year? 0 points  What month? 0 points  What time? 0 points  Count back from 20 0 points  Months in reverse 0 points  Repeat phrase 0 points  Total Score 0 points    Immunizations Immunization History  Administered Date(s) Administered   Influenza Whole 01/26/2010   Influenza, High Dose Seasonal PF 11/19/2021   Influenza, Seasonal, Injecte, Preservative Fre 12/08/2015   Influenza,inj,Quad PF,6+ Mos 12/06/2016, 12/23/2017, 11/27/2018   Influenza-Unspecified 12/03/2014, 11/27/2019, 12/07/2020   Moderna Sars-Covid-2 Vaccination 05/11/2019, 06/08/2019, 01/14/2020   PNEUMOCOCCAL CONJUGATE-20 02/01/2021   Pfizer Covid-19 Vaccine Bivalent Booster 36yrs & up 11/08/2022   Pfizer(Comirnaty)Fall Seasonal Vaccine 12 years and older 12/19/2021   Td 01/19/2009   Tdap 01/21/2019   Zoster Recombinant(Shingrix) 02/09/2021, 04/12/2021   Zoster, Live 10/19/2015    TDAP status: Up to date  Flu Vaccine status: Up to date  Pneumococcal vaccine status: Up to date  Covid-19 vaccine status: Completed vaccines  Qualifies for Shingles Vaccine? Yes   Zostavax completed Yes   Shingrix Completed?: Yes  Screening Tests Health Maintenance  Topic Date Due   COVID-19 Vaccine (6 - 2024-25 season) 01/03/2023   INFLUENZA VACCINE  06/11/2023 (Originally 09/12/2022)   Colonoscopy  06/12/2023 (Originally 03/21/2022)   MAMMOGRAM  03/09/2023   Medicare Annual Wellness (AWV)  01/23/2024   DEXA SCAN  03/09/2027   DTaP/Tdap/Td (3 - Td or Tdap) 01/20/2029   Pneumonia Vaccine 23+ Years old  Completed   Hepatitis C Screening  Completed   Zoster Vaccines- Shingrix  Completed   HPV VACCINES  Aged Out   Lung Cancer Screening   Discontinued    Health Maintenance  Health Maintenance Due  Topic Date Due   COVID-19 Vaccine (6 - 2024-25 season) 01/03/2023    Colorectal cancer screening: Type of screening: Colonoscopy. Completed 03/21/21. Repeat every 5-10 years  Mammogram status: Ordered yes. Pt provided with contact info and advised to call to schedule appt.   Bone Density status: Completed 03/08/22. Results reflect: Bone density results: OSTEOPENIA. Repeat every 3 years.  Lung Cancer Screening: (Low Dose CT Chest recommended if Age 82-80 years, 20 pack-year currently smoking OR have quit w/in 15years.) does not qualify.   Lung Cancer Screening Referral: no  Additional Screening:  Hepatitis C Screening: does not qualify; Completed 09/29/2015  Vision Screening: Recommended annual ophthalmology exams for early detection of glaucoma and other disorders of the eye. Is the patient up to date with their annual eye exam?  Yes  Who is the provider or what is the name of the office in which the patient attends annual eye exams? [name] If pt is not established with a provider, would they like to be referred to a  provider to establish care? No .   Dental Screening: Recommended annual dental exams for proper oral hygiene  Diabetic Foot Exam: n/a  Community Resource Referral / Chronic Care Management: CRR required this visit?  No   CCM required this visit?  No     Plan:     I have personally reviewed and noted the following in the patient's chart:   Medical and social history Use of alcohol, tobacco or illicit drugs  Current medications and supplements including opioid prescriptions. Patient is not currently taking opioid prescriptions. Functional ability and status Nutritional status Physical activity Advanced directives List of other physicians Hospitalizations, surgeries, and ER visits in previous 12 months Vitals Screenings to include cognitive, depression, and falls Referrals and  appointments  In addition, I have reviewed and discussed with patient certain preventive protocols, quality metrics, and best practice recommendations. A written personalized care plan for preventive services as well as general preventive health recommendations were provided to patient.     Sue Lush, LPN   16/11/9602   After Visit Summary: (MyChart) Due to this being a telephonic visit, the after visit summary with patients personalized plan was offered to patient via MyChart   Nurse Notes: The patient states she is doing alright with her immunotherapy. She  has no concerns or questions at this time.

## 2023-01-25 ENCOUNTER — Other Ambulatory Visit: Payer: Self-pay

## 2023-01-29 ENCOUNTER — Inpatient Hospital Stay: Payer: Medicare Other

## 2023-01-29 VITALS — BP 146/84 | HR 92 | Temp 98.4°F | Resp 18 | Wt 155.0 lb

## 2023-01-29 DIAGNOSIS — E871 Hypo-osmolality and hyponatremia: Secondary | ICD-10-CM | POA: Diagnosis not present

## 2023-01-29 DIAGNOSIS — Z5112 Encounter for antineoplastic immunotherapy: Secondary | ICD-10-CM | POA: Diagnosis not present

## 2023-01-29 DIAGNOSIS — C3491 Malignant neoplasm of unspecified part of right bronchus or lung: Secondary | ICD-10-CM

## 2023-01-29 DIAGNOSIS — C3411 Malignant neoplasm of upper lobe, right bronchus or lung: Secondary | ICD-10-CM | POA: Diagnosis not present

## 2023-01-29 DIAGNOSIS — Z79899 Other long term (current) drug therapy: Secondary | ICD-10-CM | POA: Diagnosis not present

## 2023-01-29 DIAGNOSIS — G629 Polyneuropathy, unspecified: Secondary | ICD-10-CM | POA: Diagnosis not present

## 2023-01-29 DIAGNOSIS — G4700 Insomnia, unspecified: Secondary | ICD-10-CM | POA: Diagnosis not present

## 2023-01-29 DIAGNOSIS — N390 Urinary tract infection, site not specified: Secondary | ICD-10-CM | POA: Diagnosis not present

## 2023-01-29 DIAGNOSIS — D649 Anemia, unspecified: Secondary | ICD-10-CM | POA: Diagnosis not present

## 2023-01-29 MED ORDER — SODIUM CHLORIDE 0.9 % IV SOLN
Freq: Once | INTRAVENOUS | Status: AC
  Filled 2023-01-29: qty 250

## 2023-01-29 MED ORDER — HEPARIN SOD (PORK) LOCK FLUSH 100 UNIT/ML IV SOLN
500.0000 [IU] | Freq: Once | INTRAVENOUS | Status: AC | PRN
Start: 2023-01-29 — End: 2023-01-29
  Administered 2023-01-29: 500 [IU]
  Filled 2023-01-29: qty 5

## 2023-01-29 MED ORDER — SODIUM CHLORIDE 0.9 % IV SOLN
10.0000 mg/kg | Freq: Once | INTRAVENOUS | Status: AC
  Administered 2023-01-29: 740 mg via INTRAVENOUS
  Filled 2023-01-29: qty 10

## 2023-01-29 NOTE — Patient Instructions (Signed)

## 2023-02-06 ENCOUNTER — Other Ambulatory Visit (INDEPENDENT_AMBULATORY_CARE_PROVIDER_SITE_OTHER): Payer: Medicare Other

## 2023-02-06 DIAGNOSIS — E7800 Pure hypercholesterolemia, unspecified: Secondary | ICD-10-CM | POA: Diagnosis not present

## 2023-02-06 DIAGNOSIS — R7303 Prediabetes: Secondary | ICD-10-CM

## 2023-02-06 DIAGNOSIS — E559 Vitamin D deficiency, unspecified: Secondary | ICD-10-CM

## 2023-02-06 LAB — LIPID PANEL
Cholesterol: 196 mg/dL (ref 0–200)
HDL: 58.3 mg/dL (ref >39.00)
LDL Cholesterol: 108 mg/dL — ABNORMAL HIGH (ref 0–99)
NonHDL: 137.91
Total CHOL/HDL Ratio: 3
Triglycerides: 148 mg/dL (ref 0.0–149.0)
VLDL: 29.6 mg/dL (ref 0.0–40.0)

## 2023-02-06 LAB — HEMOGLOBIN A1C: Hgb A1c MFr Bld: 5.8 % (ref 4.6–6.5)

## 2023-02-06 LAB — VITAMIN D 25 HYDROXY (VIT D DEFICIENCY, FRACTURES): VITD: 38.06 ng/mL (ref 30.00–100.00)

## 2023-02-10 NOTE — Progress Notes (Signed)
No critical labs need to be addressed urgently. We will discuss labs in detail at upcoming office visit.   

## 2023-02-13 ENCOUNTER — Encounter: Payer: Medicare Other | Admitting: Family Medicine

## 2023-02-13 ENCOUNTER — Inpatient Hospital Stay: Payer: Medicare Other | Attending: Oncology

## 2023-02-13 ENCOUNTER — Inpatient Hospital Stay (HOSPITAL_BASED_OUTPATIENT_CLINIC_OR_DEPARTMENT_OTHER): Payer: Medicare Other | Admitting: Oncology

## 2023-02-13 ENCOUNTER — Inpatient Hospital Stay: Payer: Medicare Other

## 2023-02-13 VITALS — BP 144/97 | HR 89 | Temp 97.6°F | Resp 18 | Wt 156.0 lb

## 2023-02-13 VITALS — BP 134/87 | HR 87

## 2023-02-13 DIAGNOSIS — Z79620 Long term (current) use of immunosuppressive biologic: Secondary | ICD-10-CM | POA: Insufficient documentation

## 2023-02-13 DIAGNOSIS — C3491 Malignant neoplasm of unspecified part of right bronchus or lung: Secondary | ICD-10-CM | POA: Diagnosis not present

## 2023-02-13 DIAGNOSIS — C3411 Malignant neoplasm of upper lobe, right bronchus or lung: Secondary | ICD-10-CM | POA: Diagnosis not present

## 2023-02-13 DIAGNOSIS — Z5112 Encounter for antineoplastic immunotherapy: Secondary | ICD-10-CM | POA: Diagnosis not present

## 2023-02-13 DIAGNOSIS — G4700 Insomnia, unspecified: Secondary | ICD-10-CM | POA: Insufficient documentation

## 2023-02-13 DIAGNOSIS — G629 Polyneuropathy, unspecified: Secondary | ICD-10-CM | POA: Insufficient documentation

## 2023-02-13 DIAGNOSIS — F419 Anxiety disorder, unspecified: Secondary | ICD-10-CM | POA: Insufficient documentation

## 2023-02-13 LAB — CBC WITH DIFFERENTIAL (CANCER CENTER ONLY)
Abs Immature Granulocytes: 0.02 10*3/uL (ref 0.00–0.07)
Basophils Absolute: 0 10*3/uL (ref 0.0–0.1)
Basophils Relative: 1 %
Eosinophils Absolute: 0.1 10*3/uL (ref 0.0–0.5)
Eosinophils Relative: 2 %
HCT: 39 % (ref 36.0–46.0)
Hemoglobin: 12.7 g/dL (ref 12.0–15.0)
Immature Granulocytes: 1 %
Lymphocytes Relative: 23 %
Lymphs Abs: 1 10*3/uL (ref 0.7–4.0)
MCH: 27.4 pg (ref 26.0–34.0)
MCHC: 32.6 g/dL (ref 30.0–36.0)
MCV: 84.1 fL (ref 80.0–100.0)
Monocytes Absolute: 0.5 10*3/uL (ref 0.1–1.0)
Monocytes Relative: 12 %
Neutro Abs: 2.7 10*3/uL (ref 1.7–7.7)
Neutrophils Relative %: 61 %
Platelet Count: 266 10*3/uL (ref 150–400)
RBC: 4.64 MIL/uL (ref 3.87–5.11)
RDW: 15.5 % (ref 11.5–15.5)
WBC Count: 4.3 10*3/uL (ref 4.0–10.5)
nRBC: 0 % (ref 0.0–0.2)

## 2023-02-13 LAB — CMP (CANCER CENTER ONLY)
ALT: 19 U/L (ref 0–44)
AST: 29 U/L (ref 15–41)
Albumin: 3.8 g/dL (ref 3.5–5.0)
Alkaline Phosphatase: 77 U/L (ref 38–126)
Anion gap: 10 (ref 5–15)
BUN: 17 mg/dL (ref 8–23)
CO2: 22 mmol/L (ref 22–32)
Calcium: 9.1 mg/dL (ref 8.9–10.3)
Chloride: 103 mmol/L (ref 98–111)
Creatinine: 0.84 mg/dL (ref 0.44–1.00)
GFR, Estimated: >60 mL/min (ref >60)
Glucose, Bld: 121 mg/dL — ABNORMAL HIGH (ref 70–99)
Potassium: 3.9 mmol/L (ref 3.5–5.1)
Sodium: 135 mmol/L (ref 135–145)
Total Bilirubin: 0.4 mg/dL (ref 0.0–1.2)
Total Protein: 7.2 g/dL (ref 6.5–8.1)

## 2023-02-13 LAB — TSH: TSH: 2.21 u[IU]/mL (ref 0.350–4.500)

## 2023-02-13 MED ORDER — SODIUM CHLORIDE 0.9 % IV SOLN
10.0000 mg/kg | Freq: Once | INTRAVENOUS | Status: AC
  Administered 2023-02-13: 740 mg via INTRAVENOUS
  Filled 2023-02-13: qty 4.8

## 2023-02-13 MED ORDER — HEPARIN SOD (PORK) LOCK FLUSH 100 UNIT/ML IV SOLN
500.0000 [IU] | Freq: Once | INTRAVENOUS | Status: AC | PRN
  Administered 2023-02-13: 500 [IU]
  Filled 2023-02-13: qty 5

## 2023-02-13 MED ORDER — SODIUM CHLORIDE 0.9 % IV SOLN
Freq: Once | INTRAVENOUS | Status: AC
  Filled 2023-02-13: qty 250

## 2023-02-13 NOTE — Patient Instructions (Signed)

## 2023-02-13 NOTE — Progress Notes (Signed)
 [facility]  [phone] [phone]  ID: Connie West OB: June 01, 1955  MR#: [id number]  RDW#:[id number]  Patient Care Team: [name] BRAVO, MD as PCP - General [name], RN as Oncology Nurse Navigator [name], [name], MD as Consulting Physician (Oncology) Pa, [name]  CHIEF COMPLAINT: Stage IIIb adenocarcinoma of the lung.  INTERVAL HISTORY: Patient returns to clinic today for further evaluation and consideration of cycle 12 of maintenance durvalumab .  She currently feels well and is asymptomatic.  She is tolerating her treatments without significant side effects.  She continues to have a mild peripheral neuropathy, that does not affect her day-to-day activity.  She has no other neurologic complaints.  She denies any recent fevers or illnesses.  She has a good appetite and denies weight loss.  She has no chest pain, shortness of breath, cough, or hemoptysis.  She denies any nausea, vomiting, constipation, or diarrhea.  She has no urinary complaints.  Patient offers no further specific complaints today.  REVIEW OF SYSTEMS:   Review of Systems  Constitutional: Negative.  Negative for fever, malaise/fatigue and weight loss.  Respiratory: Negative.  Negative for cough, hemoptysis and shortness of breath.   Cardiovascular: Negative.  Negative for chest pain and leg swelling.  Gastrointestinal: Negative.  Negative for abdominal pain.  Genitourinary: Negative.  Negative for dysuria.  Musculoskeletal: Negative.  Negative for back pain and joint pain.  Skin: Negative.  Negative for rash.  Neurological:  Positive for tingling and sensory change. Negative for dizziness, focal weakness, weakness and headaches.  Psychiatric/Behavioral: Negative.  The patient is not nervous/anxious.     As per HPI. Otherwise, a complete review of systems is negative.  PAST MEDICAL HISTORY: Past Medical History:  Diagnosis Date   Complication of  anesthesia    Hyperlipidemia    PONV (postoperative nausea and vomiting)    TIA (transient ischemic attack)     PAST SURGICAL HISTORY: Past Surgical History:  Procedure Laterality Date   BREAST CYST ASPIRATION Right 07/20/2012   FNA benign   BREAST CYST ASPIRATION Right    BREAST SURGERY Right 07-20-12   FNA benign   BRONCHIAL NEEDLE ASPIRATION BIOPSY  06/07/2022   Procedure: BRONCHIAL NEEDLE ASPIRATION BIOPSIES;  Surgeon: Isadora Hose, MD;  Location: [facility];  Service: Pulmonary;;   CESAREAN SECTION     CHOLECYSTECTOMY     COLONOSCOPY WITH PROPOFOL  N/A 03/21/2021   Procedure: COLONOSCOPY WITH PROPOFOL ;  Surgeon: Unk Corinn Skiff, MD;  Location: [facility];  Service: Gastroenterology;  Laterality: N/A;   IR IMAGING GUIDED PORT INSERTION  06/21/2022   OVARY SURGERY     VIDEO BRONCHOSCOPY WITH ENDOBRONCHIAL ULTRASOUND N/A 06/07/2022   Procedure: VIDEO BRONCHOSCOPY WITH ENDOBRONCHIAL ULTRASOUND;  Surgeon: Isadora Hose, MD;  Location: [facility];  Service: Pulmonary;  Laterality: N/A;    FAMILY HISTORY: Family History  Problem Relation Age of Onset   Breast cancer Mother 77   Cancer Mother        breast   Cancer Father        bone cancer    ADVANCED DIRECTIVES (Y/N):  N  HEALTH MAINTENANCE: Social History   Tobacco Use   Smoking status: Former    Current packs/day: 0.00    Average packs/day: 0.6 packs/day for 43.0 years (26.7 ttl pk-yrs)    Types: Cigarettes    Start date: 71    Quit date: 2016    Years since quitting: 9.0   Smokeless tobacco: Never  Tobacco comments:    quit x 1 month 11/16  Vaping Use   Vaping status: Never Used  Substance Use Topics   Alcohol use: Yes    Comment: occasional : 1x/week   Drug use: No     Colonoscopy:  PAP:  Bone density:  Lipid panel:  Allergies  Allergen Reactions   Penicillins Hives    Current Outpatient Medications  Medication Sig Dispense Refill   aspirin EC 81 MG tablet Take 81 mg by mouth in the  morning.     buPROPion  (WELLBUTRIN  XL) 150 MG 24 hr tablet TAKE 1 TABLET BY MOUTH IN THE MORNING. FOR MOOD AND FATIGUE. 90 tablet 1   calcium carbonate (OS-CAL) 600 MG TABS Take 600 mg by mouth every evening.     Coenzyme Q10 (CO Q 10 PO) Take 1 capsule by mouth in the morning.     Fluocinolone  Acetonide 0.01 % OIL Apply twice daily to ears as needed for rash/itching 20 mL 2   ibuprofen (ADVIL,MOTRIN) 200 MG tablet Take 400 mg by mouth every 8 (eight) hours as needed (pain.).     ketoconazole  (NIZORAL ) 2 % shampoo 2-3 times per week lather on scalp and ears, leave on 8-10 minutes, rinse well (Patient taking differently: Apply 1 Application topically once a week. Once per week lather on scalp and ears, leave on 8-10 minutes, rinse well) 120 mL 5   lidocaine -prilocaine  (EMLA ) cream Apply 1 Application topically as needed.     MAGNESIUM PO Take 1 tablet by mouth every evening.     nitrofurantoin , macrocrystal-monohydrate, (MACROBID ) 100 MG capsule Take 1 capsule (100 mg total) by mouth 2 (two) times daily. (Patient not taking: Reported on 01/23/2023) 10 capsule 0   OMEGA-3 FATTY ACIDS PO Take 1 g by mouth every evening.     phenazopyridine (PYRIDIUM) 95 MG tablet Take 95 mg by mouth 3 (three) times daily as needed for pain. (Patient not taking: Reported on 01/23/2023)     simvastatin  (ZOCOR ) 40 MG tablet TAKE 1 TABLET BY MOUTH EVERY DAY 90 tablet 3   sulfamethoxazole -trimethoprim  (BACTRIM  DS) 800-160 MG tablet Take 1 tablet by mouth 2 (two) times daily. (Patient not taking: Reported on 01/23/2023) 14 tablet 0   VITAMIN D  PO Take 1,000 Units by mouth in the morning.     No current facility-administered medications for this visit.    OBJECTIVE: Vitals:   02/13/23 1023 02/13/23 1024  BP: (!) 153/105 (!) 144/97  Pulse: 89   Resp: 18   Temp: 97.6 F (36.4 C)   SpO2: 100%      Body mass index is 25.18 kg/m.    ECOG FS:0 - Asymptomatic  General: Well-developed, well-nourished, no acute  distress. Eyes: Pink conjunctiva, anicteric sclera. HEENT: Normocephalic, moist mucous membranes. Lungs: No audible wheezing or coughing. Heart: Regular rate and rhythm. Abdomen: Soft, nontender, no obvious distention. Musculoskeletal: No edema, cyanosis, or clubbing. Neuro: Alert, answering all questions appropriately. Cranial nerves grossly intact. Skin: No rashes or petechiae noted. Psych: Normal affect.  LAB RESULTS:  Lab Results  Component Value Date   NA 135 02/13/2023   K 3.9 02/13/2023   CL 103 02/13/2023   CO2 22 02/13/2023   GLUCOSE 121 (H) 02/13/2023   BUN 17 02/13/2023   CREATININE 0.84 02/13/2023   CALCIUM 9.1 02/13/2023   PROT 7.2 02/13/2023   ALBUMIN 3.8 02/13/2023   AST 29 02/13/2023   ALT 19 02/13/2023   ALKPHOS 77 02/13/2023   BILITOT 0.4 02/13/2023  GFRNONAA >60 02/13/2023   GFRAA >60 01/04/2018    Lab Results  Component Value Date   WBC 4.3 02/13/2023   NEUTROABS 2.7 02/13/2023   HGB 12.7 02/13/2023   HCT 39.0 02/13/2023   MCV 84.1 02/13/2023   PLT 266 02/13/2023     STUDIES: No results found.  ASSESSMENT: Stage IIIb adenocarcinoma of the lung.  PLAN:    Stage IIIb adenocarcinoma of the lung: Biopsy from bronchoscopy on June 07, 2022 confirming the diagnosis.  PET scan results from May 22, 2022 reviewed independently confirming stage of disease.  The bilateral hypermetabolic cervical lymph nodes are suspicious, but will treat patient as a stage IIIb.  MRI of the brain on Jun 19, 2022 did not reveal any metastatic disease.  Patient completed weekly carboplatin  and Taxol  on July 31, 2022.  Patient now receiving maintenance durvalumab  every 2 weeks for 1 year.  She initiated maintenance treatment on August 14, 2022.  PET scan results from November 20, 2022 reviewed independently with interval improvement of disease along with extensive posttreatment changes.  Proceed with cycle 14 of treatment today.  Return to clinic in 2 weeks for treatment only and  then in 4 weeks for further evaluation and consideration of cycle 16.    UTI: Resolved.   Peripheral neuropathy: Chronic and unchanged.   Hyponatremia: Resolved. Anemia: Resolved. Insomnia: Patient does not complain of this today.  Continue Xanax  as needed. Adjustment/anxiety: Continue Wellbutrin  as prescribed.   Patient expressed understanding and was in agreement with this plan. She also understands that She can call clinic at any time with any questions, concerns, or complaints.    Cancer Staging  Adenocarcinoma of right lung [facility]) Staging form: Lung, AJCC 8th Edition - Clinical stage from 06/12/2022: Stage IIIB (cT2b, cN3, cM0) - Signed by [name] [name], MD on 06/12/2022 Stage prefix: Initial diagnosis   [name] [name], MD   02/13/2023 10:38 AM

## 2023-02-14 LAB — T4: T4, Total: 8.3 ug/dL (ref 4.5–12.0)

## 2023-02-20 ENCOUNTER — Ambulatory Visit (INDEPENDENT_AMBULATORY_CARE_PROVIDER_SITE_OTHER): Payer: Medicare Other | Admitting: Family Medicine

## 2023-02-20 ENCOUNTER — Encounter: Payer: Self-pay | Admitting: Family Medicine

## 2023-02-20 ENCOUNTER — Encounter: Payer: Self-pay | Admitting: Oncology

## 2023-02-20 VITALS — BP 132/82 | HR 104 | Temp 97.8°F | Ht 64.0 in | Wt 154.5 lb

## 2023-02-20 DIAGNOSIS — E7800 Pure hypercholesterolemia, unspecified: Secondary | ICD-10-CM | POA: Diagnosis not present

## 2023-02-20 DIAGNOSIS — Z0000 Encounter for general adult medical examination without abnormal findings: Secondary | ICD-10-CM | POA: Diagnosis not present

## 2023-02-20 DIAGNOSIS — R011 Cardiac murmur, unspecified: Secondary | ICD-10-CM | POA: Insufficient documentation

## 2023-02-20 DIAGNOSIS — R7303 Prediabetes: Secondary | ICD-10-CM

## 2023-02-20 DIAGNOSIS — E559 Vitamin D deficiency, unspecified: Secondary | ICD-10-CM

## 2023-02-20 DIAGNOSIS — C3491 Malignant neoplasm of unspecified part of right bronchus or lung: Secondary | ICD-10-CM

## 2023-02-20 NOTE — Assessment & Plan Note (Signed)
 Chronic,  stable control.  She will work on low carbohydrate diet.  She will return to regular exercise.

## 2023-02-20 NOTE — Assessment & Plan Note (Signed)
 Followed by D.r [name] Reviewed last office visit note from February 13, 2023 Biopsy from bronchoscopy on June 07, 2022 confirming the diagnosis. PET scan results from May 22, 2022 reviewed independently confirming stage of disease. The bilateral hypermetabolic cervical lymph nodes are suspicious, but will treat patient as a stage IIIb. MRI of the brain on Jun 19, 2022 did not reveal any metastatic disease. Patient completed weekly carboplatin  and Taxol  on July 31, 2022. Patient now receiving maintenance durvalumab  every 2 weeks for 1 year.

## 2023-02-20 NOTE — Assessment & Plan Note (Signed)
 No past record of murmur.. pt asymptomatic.  Pt with history of chest radiation for adenocarcinoma lung.  Will have her discuss with oncology.  May nee to move forward with  ECHO to evaluate

## 2023-02-20 NOTE — Patient Instructions (Addendum)
 Have lipid panel rechecked after lifestyle cahnges in about 3-6 months at cancer clinic.  Discuss possible new murmur with Dr. JULIANNA... we can move forward with ECHO of heart if  needed.  Please call the location of your choice from the menu below to schedule your Mammogram and/or Bone Density appointment.     [facility] at [facility]   Phone:  [phone]   [address]                                                                            [place], [place] [address]                                            Services: 3D Mammogram and Bone [facility] at [facility])  Phone:  [phone]   [address]                        [place], [place] [address]                                              Services:  3D Mammogram and Bone Density

## 2023-02-20 NOTE — Progress Notes (Signed)
 Patient ID: Connie West, female    DOB: May 16, 1955, 68 y.o.   MRN: [id number]  This visit was conducted in person.  BP 132/82   Pulse (!) 104   Temp 97.8 F (36.6 C) (Oral)   Ht 5' 4 (1.626 m)   Wt 154 lb 8 oz (70.1 kg)   BMI 26.52 kg/m    CC:  Chief Complaint  Patient presents with   Annual Exam    MCR prt 2 [AWV- 01/23/23].    Subjective:   HPI: Connie West is a 68 y.o. female presenting on 02/20/2023 for Annual Exam (MCR prt 2 [AWV- 01/23/23].)  The patient presents for annual medicare wellness, complete physical and review of chronic health problems. He/She also has the following acute concerns today:     She has started on Wellbutrin   Xl for fatgiue, depression.. this has helped some.  The patient saw a LPN or RN for medicare wellness visit. 01/23/2023  Prevention and wellness was reviewed in detail. Note reviewed and important notes copied below.  Pt dx with lung cancer on CT screening... followed by D.r [name] Reviewed last office visit note from February 13, 2023 Biopsy from bronchoscopy on June 07, 2022 confirming the diagnosis. PET scan results from May 22, 2022 reviewed independently confirming stage of disease. The bilateral hypermetabolic cervical lymph nodes are suspicious, but will treat patient as a stage IIIb. MRI of the brain on Jun 19, 2022 did not reveal any metastatic disease. Patient completed weekly carboplatin  and Taxol  on July 31, 2022. Patient now receiving maintenance durvalumab  every 2 weeks for 1 year.   Elevated Cholesterol:  Hx of CVA, on simvastatin   40 mg daily.  LDL not at goal < 70 Lab Results  Component Value Date   CHOL 196 02/06/2023   HDL 58.30 02/06/2023   LDLCALC 108 (H) 02/06/2023   LDLDIRECT 111.0 02/01/2021   TRIG 148.0 02/06/2023   CHOLHDL 3 02/06/2023  Using medications without problems: Muscle aches:  Diet compliance: heart healthy diet Exercise:  walking off and on but shortness of breath with exercsie. Other  complaints:  Wt Readings from Last 3 Encounters:  02/20/23 154 lb 8 oz (70.1 kg)  02/13/23 156 lb (70.8 kg)  01/29/23 154 lb 15.7 oz (70.3 kg)     Prediabetes  Lab Results  Component Value Date   HGBA1C 5.8 02/06/2023   Vit D:  at goal.     Relevant past medical, surgical, family and social history reviewed and updated as indicated. Interim medical history since our last visit reviewed. Allergies and medications reviewed and updated. Outpatient Medications Prior to Visit  Medication Sig Dispense Refill   aspirin EC 81 MG tablet Take 81 mg by mouth in the morning.     buPROPion  (WELLBUTRIN  XL) 150 MG 24 hr tablet TAKE 1 TABLET BY MOUTH IN THE MORNING. FOR MOOD AND FATIGUE. 90 tablet 1   calcium carbonate (OS-CAL) 600 MG TABS Take 600 mg by mouth every evening.     Coenzyme Q10 (CO Q 10 PO) Take 1 capsule by mouth in the morning.     Fluocinolone  Acetonide 0.01 % OIL Apply twice daily to ears as needed for rash/itching 20 mL 2   ibuprofen (ADVIL,MOTRIN) 200 MG tablet Take 400 mg by mouth every 8 (eight) hours as needed (pain.).     ketoconazole  (NIZORAL ) 2 % shampoo 2-3 times per week lather on scalp and ears, leave on 8-10 minutes, rinse well (Patient taking differently:  Apply 1 Application topically once a week. Once per week lather on scalp and ears, leave on 8-10 minutes, rinse well) 120 mL 5   lidocaine -prilocaine  (EMLA ) cream Apply 1 Application topically as needed.     MAGNESIUM PO Take 1 tablet by mouth every evening.     OMEGA-3 FATTY ACIDS PO Take 1 g by mouth every evening.     phenazopyridine (PYRIDIUM) 95 MG tablet Take 95 mg by mouth 3 (three) times daily as needed for pain.     simvastatin  (ZOCOR ) 40 MG tablet TAKE 1 TABLET BY MOUTH EVERY DAY 90 tablet 3   VITAMIN D  PO Take 1,000 Units by mouth in the morning.     No facility-administered medications prior to visit.     Per HPI unless specifically indicated in ROS section below Review of Systems  Constitutional:   Positive for fatigue. Negative for fever.  HENT:  Negative for congestion.   Eyes:  Negative for pain.  Respiratory:  Negative for cough and shortness of breath.   Cardiovascular:  Negative for chest pain, palpitations and leg swelling.  Gastrointestinal:  Negative for abdominal pain.  Genitourinary:  Negative for dysuria and vaginal bleeding.  Musculoskeletal:  Negative for back pain.  Neurological:  Negative for syncope, light-headedness and headaches.  Psychiatric/Behavioral:  Negative for dysphoric mood.    Objective:  BP 132/82   Pulse (!) 104   Temp 97.8 F (36.6 C) (Oral)   Ht 5' 4 (1.626 m)   Wt 154 lb 8 oz (70.1 kg)   BMI 26.52 kg/m   Wt Readings from Last 3 Encounters:  02/20/23 154 lb 8 oz (70.1 kg)  02/13/23 156 lb (70.8 kg)  01/29/23 154 lb 15.7 oz (70.3 kg)      Physical Exam Vitals and nursing note reviewed.  Constitutional:      General: She is not in acute distress.    Appearance: Normal appearance. She is well-developed. She is not ill-appearing or toxic-appearing.  HENT:     Head: Normocephalic.     Right Ear: Hearing, tympanic membrane, ear canal and external ear normal.     Left Ear: Hearing, tympanic membrane, ear canal and external ear normal.     Nose: Nose normal.  Eyes:     General: Lids are normal. Lids are everted, no foreign bodies appreciated.     Conjunctiva/sclera: Conjunctivae normal.     Pupils: Pupils are equal, round, and reactive to light.  Neck:     Thyroid : No thyroid  mass or thyromegaly.     Vascular: No carotid bruit.     Trachea: Trachea normal.  Cardiovascular:     Rate and Rhythm: Normal rate and regular rhythm.     Pulses: Normal pulses.     Heart sounds: S1 normal and S2 normal. Murmur heard.     Systolic murmur is present with a grade of 1/6.     No diastolic murmur is present.     No gallop.  Pulmonary:     Effort: Pulmonary effort is normal. No respiratory distress.     Breath sounds: Normal breath sounds. No  wheezing, rhonchi or rales.  Abdominal:     General: Bowel sounds are normal. There is no distension or abdominal bruit.     Palpations: Abdomen is soft. There is no fluid wave or mass.     Tenderness: There is no abdominal tenderness. There is no guarding or rebound.     Hernia: No hernia is present.  Musculoskeletal:  Cervical back: Normal range of motion and neck supple.  Lymphadenopathy:     Cervical: No cervical adenopathy.  Skin:    General: Skin is warm and dry.     Findings: No rash.  Neurological:     Mental Status: She is alert.     Cranial Nerves: No cranial nerve deficit.     Sensory: No sensory deficit.  Psychiatric:        Mood and Affect: Mood is not anxious or depressed.        Speech: Speech normal.        Behavior: Behavior normal. Behavior is cooperative.        Judgment: Judgment normal.       Results for orders placed or performed in visit on 02/13/23  CBC with Differential (Cancer Center Only)   Collection Time: 02/13/23  9:59 AM  Result Value Ref Range   WBC Count 4.3 4.0 - 10.5 K/uL   RBC 4.64 3.87 - 5.11 MIL/uL   Hemoglobin 12.7 12.0 - 15.0 g/dL   HCT 60.9 63.9 - 53.9 %   MCV 84.1 80.0 - 100.0 fL   MCH 27.4 26.0 - 34.0 pg   MCHC 32.6 30.0 - 36.0 g/dL   RDW 84.4 88.4 - 84.4 %   Platelet Count 266 150 - 400 K/uL   nRBC 0.0 0.0 - 0.2 %   Neutrophils Relative % 61 %   Neutro Abs 2.7 1.7 - 7.7 K/uL   Lymphocytes Relative 23 %   Lymphs Abs 1.0 0.7 - 4.0 K/uL   Monocytes Relative 12 %   Monocytes Absolute 0.5 0.1 - 1.0 K/uL   Eosinophils Relative 2 %   Eosinophils Absolute 0.1 0.0 - 0.5 K/uL   Basophils Relative 1 %   Basophils Absolute 0.0 0.0 - 0.1 K/uL   Immature Granulocytes 1 %   Abs Immature Granulocytes 0.02 0.00 - 0.07 K/uL  CMP (Cancer Center only)   Collection Time: 02/13/23  9:59 AM  Result Value Ref Range   Sodium 135 135 - 145 mmol/L   Potassium 3.9 3.5 - 5.1 mmol/L   Chloride 103 98 - 111 mmol/L   CO2 22 22 - 32 mmol/L    Glucose, Bld 121 (H) 70 - 99 mg/dL   BUN 17 8 - 23 mg/dL   Creatinine 9.15 9.55 - 1.00 mg/dL   Calcium 9.1 8.9 - 89.6 mg/dL   Total Protein 7.2 6.5 - 8.1 g/dL   Albumin 3.8 3.5 - 5.0 g/dL   AST 29 15 - 41 U/L   ALT 19 0 - 44 U/L   Alkaline Phosphatase 77 38 - 126 U/L   Total Bilirubin 0.4 0.0 - 1.2 mg/dL   GFR, Estimated >39 >39 mL/min   Anion gap 10 5 - 15  T4   Collection Time: 02/13/23  9:59 AM  Result Value Ref Range   T4, Total 8.3 4.5 - 12.0 ug/dL  TSH   Collection Time: 02/13/23  9:59 AM  Result Value Ref Range   TSH 2.210 0.350 - 4.500 uIU/mL     COVID 19 screen:  No recent travel or known exposure to COVID19 The patient denies respiratory symptoms of COVID 19 at this time. The importance of social distancing was discussed today.   Assessment and Plan   The patient's preventative maintenance and recommended screening tests for an annual wellness exam were reviewed in full today. Brought up to date unless services declined.  Counselled on the importance of diet, exercise,  and its role in overall health and mortality. The patient's FH and SH was reviewed, including their home life, tobacco status, and drug and alcohol status.   Vaccines:  had flu 11/2022, COVID x 3, Tdap, consider shingles vaccine ( given prescription), Given prevnar 20 today Mammo: 02/2022 nml  Mother with early breast cancer. Colon:   high risk polyps  03/21/21, 1 year recall.. wishes to postpone given currently on chemo for lung cancer. PAP/DVE: Every 5 years pap, DVE yearly.  Last pap 01/2020 neg HPV, no lower abdominal symptoms.. , no further indicated. DXA: 02/2022 Stable osteopenia in hip, normal density in spine, repeat in 5 years   Former smoker, 30 pack year history.  Quit 2016  Hep C: neg.  STD screen/HIV: refused  Problem List Items Addressed This Visit     Adenocarcinoma of right lung (HCC)   Followed by D.r [name] Reviewed last office visit note from February 13, 2023 Biopsy from  bronchoscopy on June 07, 2022 confirming the diagnosis. PET scan results from May 22, 2022 reviewed independently confirming stage of disease. The bilateral hypermetabolic cervical lymph nodes are suspicious, but will treat patient as a stage IIIb. MRI of the brain on Jun 19, 2022 did not reveal any metastatic disease. Patient completed weekly carboplatin  and Taxol  on July 31, 2022. Patient now receiving maintenance durvalumab  every 2 weeks for 1 year.       Newly recognized heart murmur    No past record of murmur.. pt asymptomatic.  Pt with history of chest radiation for adenocarcinoma lung.  Will have her discuss with oncology.  May nee to move forward with  ECHO to evaluate      Prediabetes   Chronic,  stable control.  She will work on low carbohydrate diet.  She will return to regular exercise.      Pure hypercholesterolemia (Chronic)   Chronic, LDL not at goal less than 70 on simvastatin  40 mg daily.   Recommend changing to crestor higher dose... but we will give her more time to get back to routine and work on lifestyle cahnges.      Relevant Orders   Lipid panel   Vitamin D  deficiency   Chronic, resolved with supplementation.      Other Visit Diagnoses       Routine general medical examination at a health care facility    -  Primary       Orders Placed This Encounter  Procedures   Lipid panel    Standing Status:   Future    Expected Date:   05/21/2023    Expiration Date:   05/21/2023    Greig Ring, MD

## 2023-02-20 NOTE — Assessment & Plan Note (Signed)
Chronic, resolved with supplementation 

## 2023-02-20 NOTE — Assessment & Plan Note (Addendum)
 Chronic, LDL not at goal less than 70 on simvastatin 40 mg daily.   Recommend changing to crestor higher dose... but we will give her more time to get back to routine and work on lifestyle cahnges.

## 2023-02-21 ENCOUNTER — Other Ambulatory Visit: Payer: Self-pay

## 2023-02-26 ENCOUNTER — Inpatient Hospital Stay: Payer: Medicare Other

## 2023-02-26 VITALS — BP 144/72 | HR 98 | Temp 97.6°F | Resp 18

## 2023-02-26 DIAGNOSIS — F419 Anxiety disorder, unspecified: Secondary | ICD-10-CM | POA: Diagnosis not present

## 2023-02-26 DIAGNOSIS — C3411 Malignant neoplasm of upper lobe, right bronchus or lung: Secondary | ICD-10-CM | POA: Diagnosis not present

## 2023-02-26 DIAGNOSIS — G4700 Insomnia, unspecified: Secondary | ICD-10-CM | POA: Diagnosis not present

## 2023-02-26 DIAGNOSIS — Z79620 Long term (current) use of immunosuppressive biologic: Secondary | ICD-10-CM | POA: Diagnosis not present

## 2023-02-26 DIAGNOSIS — C3491 Malignant neoplasm of unspecified part of right bronchus or lung: Secondary | ICD-10-CM

## 2023-02-26 DIAGNOSIS — Z5112 Encounter for antineoplastic immunotherapy: Secondary | ICD-10-CM | POA: Diagnosis not present

## 2023-02-26 DIAGNOSIS — G629 Polyneuropathy, unspecified: Secondary | ICD-10-CM | POA: Diagnosis not present

## 2023-02-26 MED ORDER — HEPARIN SOD (PORK) LOCK FLUSH 100 UNIT/ML IV SOLN
500.0000 [IU] | Freq: Once | INTRAVENOUS | Status: AC | PRN
  Administered 2023-02-26: 500 [IU]
  Filled 2023-02-26: qty 5

## 2023-02-26 MED ORDER — SODIUM CHLORIDE 0.9 % IV SOLN
Freq: Once | INTRAVENOUS | Status: AC
Start: 2023-02-26 — End: 2023-02-26
  Filled 2023-02-26: qty 250

## 2023-02-26 MED ORDER — SODIUM CHLORIDE 0.9 % IV SOLN
10.0000 mg/kg | Freq: Once | INTRAVENOUS | Status: AC
  Administered 2023-02-26: 740 mg via INTRAVENOUS
  Filled 2023-02-26: qty 4.8

## 2023-02-26 NOTE — Patient Instructions (Signed)
 [facility] - A DEPT OF [facility]  Discharge Instructions: Thank you for choosing [facility] to provide your oncology and hematology care.  If you have a lab appointment with the Cancer Center, please go directly to the Cancer Center and check in at the registration area.  Wear comfortable clothing and clothing appropriate for easy access to any Portacath or PICC line.   We strive to give you quality time with your provider. You may need to reschedule your appointment if you arrive late (15 or more minutes).  Arriving late affects you and other patients whose appointments are after yours.  Also, if you miss three or more appointments without notifying the office, you may be dismissed from the clinic at the provider's discretion.      For prescription refill requests, have your pharmacy contact our office and allow 72 hours for refills to be completed.    Today you received the following chemotherapy and/or immunotherapy agents IMFINZI      To help prevent nausea and vomiting after your treatment, we encourage you to take your nausea medication as directed.  BELOW ARE SYMPTOMS THAT SHOULD BE REPORTED IMMEDIATELY: *FEVER GREATER THAN 100.4 F (38 C) OR HIGHER *CHILLS OR SWEATING *NAUSEA AND VOMITING THAT IS NOT CONTROLLED WITH YOUR NAUSEA MEDICATION *UNUSUAL SHORTNESS OF BREATH *UNUSUAL BRUISING OR BLEEDING *URINARY PROBLEMS (pain or burning when urinating, or frequent urination) *BOWEL PROBLEMS (unusual diarrhea, constipation, pain near the anus) TENDERNESS IN MOUTH AND THROAT WITH OR WITHOUT PRESENCE OF ULCERS (sore throat, sores in mouth, or a toothache) UNUSUAL RASH, SWELLING OR PAIN  UNUSUAL VAGINAL DISCHARGE OR ITCHING   Items with * indicate a potential emergency and should be followed up as soon as possible or go to the Emergency Department if any problems should occur.  Please show the CHEMOTHERAPY ALERT CARD or IMMUNOTHERAPY  ALERT CARD at check-in to the Emergency Department and triage nurse.  Should you have questions after your visit or need to cancel or reschedule your appointment, please contact [facility] - A DEPT OF [facility]  [phone] and follow the prompts.  Office hours are 8:00 a.m. to 4:30 p.m. Monday - Friday. Please note that voicemails left after 4:00 p.m. may not be returned until the following business day.  We are closed weekends and major holidays. You have access to a nurse at all times for urgent questions. Please call the main number to the clinic [phone] and follow the prompts.  For any non-urgent questions, you may also contact your provider using MyChart. We now offer e-Visits for anyone 30 and older to request care online for non-urgent symptoms. For details visit [email or web address].   Also download the MyChart app! Go to the app store, search "MyChart", open the app, select [facility], and log in with your MyChart username and password.  Durvalumab Injection What is this medication? DURVALUMAB (dur VAL ue mab) treats some types of cancer. It works by helping your immune system slow or stop the spread of cancer cells. It is a monoclonal antibody. This medicine may be used for other purposes; ask your health care provider or pharmacist if you have questions. COMMON BRAND NAME(S): IMFINZI What should I tell my care team before I take this medication? They need to know if you have any of these conditions: Allogeneic stem cell transplant (uses someone else's stem cells) Autoimmune diseases, such as Crohn disease, ulcerative  colitis, lupus History of chest radiation Nervous system problems, such as Guillain-Barre syndrome, myasthenia gravis Organ transplant An unusual or allergic reaction to durvalumab, other medications, foods, dyes, or preservatives Pregnant or trying to get pregnant Breast-feeding How should I use this medication? This  medication is infused into a vein. It is given by your care team in a hospital or clinic setting. A special MedGuide will be given to you before each treatment. Be sure to read this information carefully each time. Talk to your care team about the use of this medication in children. Special care may be needed. Overdosage: If you think you have taken too much of this medicine contact a poison control center or emergency room at once. NOTE: This medicine is only for you. Do not share this medicine with others. What if I miss a dose? Keep appointments for follow-up doses. It is important not to miss your dose. Call your care team if you are unable to keep an appointment. What may interact with this medication? Interactions have not been studied. This list may not describe all possible interactions. Give your health care provider a list of all the medicines, herbs, non-prescription drugs, or dietary supplements you use. Also tell them if you smoke, drink alcohol, or use illegal drugs. Some items may interact with your medicine. What should I watch for while using this medication? Your condition will be monitored carefully while you are receiving this medication. You may need blood work while taking this medication. This medication may cause serious skin reactions. They can happen weeks to months after starting the medication. Contact your care team right away if you notice fevers or flu-like symptoms with a rash. The rash may be red or purple and then turn into blisters or peeling of the skin. You may also notice a red rash with swelling of the face, lips, or lymph nodes in your neck or under your arms. Tell your care team right away if you have any change in your eyesight. Talk to your care team if you may be pregnant. Serious birth defects can occur if you take this medication during pregnancy and for 3 months after the last dose. You will need a negative pregnancy test before starting this medication.  Contraception is recommended while taking this medication and for 3 months after the last dose. Your care team can help you find the option that works for you. Do not breastfeed while taking this medication and for 3 months after the last dose. What side effects may I notice from receiving this medication? Side effects that you should report to your care team as soon as possible: Allergic reactions--skin rash, itching, hives, swelling of the face, lips, tongue, or throat Dry cough, shortness of breath or trouble breathing Eye pain, redness, irritation, or discharge with blurry or decreased vision Heart muscle inflammation--unusual weakness or fatigue, shortness of breath, chest pain, fast or irregular heartbeat, dizziness, swelling of the ankles, feet, or hands Hormone gland problems--headache, sensitivity to light, unusual weakness or fatigue, dizziness, fast or irregular heartbeat, increased sensitivity to cold or heat, excessive sweating, constipation, hair loss, increased thirst or amount of urine, tremors or shaking, irritability Infusion reactions--chest pain, shortness of breath or trouble breathing, feeling faint or lightheaded Kidney injury (glomerulonephritis)--decrease in the amount of urine, red or dark brown urine, foamy or bubbly urine, swelling of the ankles, hands, or feet Liver injury--right upper belly pain, loss of appetite, nausea, light-colored stool, dark yellow or brown urine, yellowing skin or eyes,  unusual weakness or fatigue Pain, tingling, or numbness in the hands or feet, muscle weakness, change in vision, confusion or trouble speaking, loss of balance or coordination, trouble walking, seizures Rash, fever, and swollen lymph nodes Redness, blistering, peeling, or loosening of the skin, including inside the mouth Sudden or severe stomach pain, bloody diarrhea, fever, nausea, vomiting Side effects that usually do not require medical attention (report these to your care team  if they continue or are bothersome): Bone, joint, or muscle pain Diarrhea Fatigue Loss of appetite Nausea Skin rash This list may not describe all possible side effects. Call your doctor for medical advice about side effects. You may report side effects to FDA at 1-800-FDA-1088. Where should I keep my medication? This medication is given in a hospital or clinic. It will not be stored at home. NOTE: This sheet is a summary. It may not cover all possible information. If you have questions about this medicine, talk to your doctor, pharmacist, or health care provider.  2024 Elsevier/Gold Standard (2021-06-12 00:00:00)

## 2023-03-10 ENCOUNTER — Ambulatory Visit
Admission: RE | Admit: 2023-03-10 | Discharge: 2023-03-10 | Disposition: A | Discharge status: Home / Self Care | Payer: Medicare Other | Source: Ambulatory Visit | Attending: Family Medicine | Admitting: Family Medicine

## 2023-03-10 DIAGNOSIS — Z1231 Encounter for screening mammogram for malignant neoplasm of breast: Secondary | ICD-10-CM | POA: Diagnosis not present

## 2023-03-10 HISTORY — DX: Personal history of irradiation: Z92.3

## 2023-03-10 HISTORY — DX: Personal history of antineoplastic chemotherapy: Z92.21

## 2023-03-10 HISTORY — DX: Malignant neoplasm of right main bronchus: C34.01

## 2023-03-12 ENCOUNTER — Inpatient Hospital Stay: Payer: Medicare Other

## 2023-03-12 ENCOUNTER — Encounter: Payer: Self-pay | Admitting: Oncology

## 2023-03-12 ENCOUNTER — Inpatient Hospital Stay: Payer: Medicare Other | Admitting: Oncology

## 2023-03-12 VITALS — BP 148/88 | HR 92

## 2023-03-12 VITALS — BP 145/95 | HR 96 | Temp 98.2°F | Resp 16 | Ht 64.0 in | Wt 156.0 lb

## 2023-03-12 DIAGNOSIS — G629 Polyneuropathy, unspecified: Secondary | ICD-10-CM | POA: Diagnosis not present

## 2023-03-12 DIAGNOSIS — G4700 Insomnia, unspecified: Secondary | ICD-10-CM | POA: Diagnosis not present

## 2023-03-12 DIAGNOSIS — Z5112 Encounter for antineoplastic immunotherapy: Secondary | ICD-10-CM | POA: Diagnosis not present

## 2023-03-12 DIAGNOSIS — C3411 Malignant neoplasm of upper lobe, right bronchus or lung: Secondary | ICD-10-CM | POA: Diagnosis not present

## 2023-03-12 DIAGNOSIS — C3491 Malignant neoplasm of unspecified part of right bronchus or lung: Secondary | ICD-10-CM

## 2023-03-12 DIAGNOSIS — Z79620 Long term (current) use of immunosuppressive biologic: Secondary | ICD-10-CM | POA: Diagnosis not present

## 2023-03-12 DIAGNOSIS — F419 Anxiety disorder, unspecified: Secondary | ICD-10-CM | POA: Diagnosis not present

## 2023-03-12 LAB — CBC WITH DIFFERENTIAL (CANCER CENTER ONLY)
Abs Immature Granulocytes: 0.02 10*3/uL (ref 0.00–0.07)
Basophils Absolute: 0 10*3/uL (ref 0.0–0.1)
Basophils Relative: 1 %
Eosinophils Absolute: 0.1 10*3/uL (ref 0.0–0.5)
Eosinophils Relative: 2 %
HCT: 38.5 % (ref 36.0–46.0)
Hemoglobin: 12.7 g/dL (ref 12.0–15.0)
Immature Granulocytes: 0 %
Lymphocytes Relative: 18 %
Lymphs Abs: 1 10*3/uL (ref 0.7–4.0)
MCH: 27.9 pg (ref 26.0–34.0)
MCHC: 33 g/dL (ref 30.0–36.0)
MCV: 84.4 fL (ref 80.0–100.0)
Monocytes Absolute: 0.5 10*3/uL (ref 0.1–1.0)
Monocytes Relative: 8 %
Neutro Abs: 4.1 10*3/uL (ref 1.7–7.7)
Neutrophils Relative %: 71 %
Platelet Count: 261 10*3/uL (ref 150–400)
RBC: 4.56 MIL/uL (ref 3.87–5.11)
RDW: 14.5 % (ref 11.5–15.5)
WBC Count: 5.7 10*3/uL (ref 4.0–10.5)
nRBC: 0 % (ref 0.0–0.2)

## 2023-03-12 LAB — CMP (CANCER CENTER ONLY)
ALT: 18 U/L (ref 0–44)
AST: 28 U/L (ref 15–41)
Albumin: 4 g/dL (ref 3.5–5.0)
Alkaline Phosphatase: 76 U/L (ref 38–126)
Anion gap: 11 (ref 5–15)
BUN: 17 mg/dL (ref 8–23)
CO2: 22 mmol/L (ref 22–32)
Calcium: 9.1 mg/dL (ref 8.9–10.3)
Chloride: 104 mmol/L (ref 98–111)
Creatinine: 0.89 mg/dL (ref 0.44–1.00)
GFR, Estimated: >60 mL/min (ref >60)
Glucose, Bld: 168 mg/dL — ABNORMAL HIGH (ref 70–99)
Potassium: 3.7 mmol/L (ref 3.5–5.1)
Sodium: 137 mmol/L (ref 135–145)
Total Bilirubin: 0.7 mg/dL (ref 0.0–1.2)
Total Protein: 7.6 g/dL (ref 6.5–8.1)

## 2023-03-12 LAB — TSH: TSH: 3.15 u[IU]/mL (ref 0.350–4.500)

## 2023-03-12 MED ORDER — SODIUM CHLORIDE 0.9 % IV SOLN
10.0000 mg/kg | Freq: Once | INTRAVENOUS | Status: AC
  Administered 2023-03-12: 740 mg via INTRAVENOUS
  Filled 2023-03-12: qty 4.8

## 2023-03-12 MED ORDER — HEPARIN SOD (PORK) LOCK FLUSH 100 UNIT/ML IV SOLN
500.0000 [IU] | Freq: Once | INTRAVENOUS | Status: AC | PRN
  Administered 2023-03-12: 500 [IU]
  Filled 2023-03-12: qty 5

## 2023-03-12 MED ORDER — SODIUM CHLORIDE 0.9 % IV SOLN
Freq: Once | INTRAVENOUS | Status: AC
  Filled 2023-03-12: qty 250

## 2023-03-12 NOTE — Progress Notes (Signed)
Was seen by her PCP on 1/9 and was told she has a heart murmur. Was told to have Dr. Orlie Dakin listen and see what he thought.

## 2023-03-12 NOTE — Progress Notes (Signed)
[facility]  Telephone:(336) [phone] Fax:(336) [phone]  ID: Connie West OB: Dec 06, 1955  MR#: [id number]  QMV#:[id number]  Patient Care Team: Excell Seltzer, MD as PCP - General Glory Buff, RN as Oncology Nurse Navigator Orlie Dakin, Tollie Pizza, MD as Consulting Physician (Oncology) Pa, [name]  CHIEF COMPLAINT: Stage IIIb adenocarcinoma of the lung.  INTERVAL HISTORY: Patient returns to clinic today for further evaluation and consideration of cycle 16 of maintenance durvalumab.  She continues to have occasional insomnia, but otherwise feels well.  She is tolerating her treatments without significant side effects. She continues to have a mild peripheral neuropathy, that does not affect her day-to-day activity.  She has no other neurologic complaints.  She denies any recent fevers or illnesses.  She has a good appetite and denies weight loss.  She has no chest pain, shortness of breath, cough, or hemoptysis.  She denies any nausea, vomiting, constipation, or diarrhea.  She has no urinary complaints.  Patient offers no further specific complaints today.  REVIEW OF SYSTEMS:   Review of Systems  Constitutional: Negative.  Negative for fever, malaise/fatigue and weight loss.  Respiratory: Negative.  Negative for cough, hemoptysis and shortness of breath.   Cardiovascular: Negative.  Negative for chest pain and leg swelling.  Gastrointestinal: Negative.  Negative for abdominal pain.  Genitourinary: Negative.  Negative for dysuria.  Musculoskeletal: Negative.  Negative for back pain and joint pain.  Skin: Negative.  Negative for rash.  Neurological:  Positive for tingling and sensory change. Negative for dizziness, focal weakness, weakness and headaches.  Psychiatric/Behavioral:  The patient has insomnia. The patient is not nervous/anxious.     As per HPI. Otherwise, a complete review of systems is negative.  PAST MEDICAL HISTORY: Past Medical History:   Diagnosis Date   Complication of anesthesia    Hyperlipidemia    Lung cancer, main bronchus, right (HCC)    Personal history of chemotherapy    Personal history of radiation therapy    PONV (postoperative nausea and vomiting)    TIA (transient ischemic attack)     PAST SURGICAL HISTORY: Past Surgical History:  Procedure Laterality Date   BREAST CYST ASPIRATION Right 07/20/2012   FNA benign   BREAST CYST ASPIRATION Right    BREAST SURGERY Right 07-20-12   FNA benign   BRONCHIAL NEEDLE ASPIRATION BIOPSY  06/07/2022   Procedure: BRONCHIAL NEEDLE ASPIRATION BIOPSIES;  Surgeon: Raechel Chute, MD;  Location: [facility];  Service: Pulmonary;;   CESAREAN SECTION     CHOLECYSTECTOMY     COLONOSCOPY WITH PROPOFOL N/A 03/21/2021   Procedure: COLONOSCOPY WITH PROPOFOL;  Surgeon: Toney Reil, MD;  Location: [facility] ENDOSCOPY;  Service: Gastroenterology;  Laterality: N/A;   IR IMAGING GUIDED PORT INSERTION  06/21/2022   OVARY SURGERY     VIDEO BRONCHOSCOPY WITH ENDOBRONCHIAL ULTRASOUND N/A 06/07/2022   Procedure: VIDEO BRONCHOSCOPY WITH ENDOBRONCHIAL ULTRASOUND;  Surgeon: Raechel Chute, MD;  Location: [facility];  Service: Pulmonary;  Laterality: N/A;    FAMILY HISTORY: Family History  Problem Relation Age of Onset   Breast cancer Mother 39   Cancer Mother        breast   Cancer Father        bone cancer    ADVANCED DIRECTIVES (Y/N):  N  HEALTH MAINTENANCE: Social History   Tobacco Use   Smoking status: Former    Current packs/day: 0.00    Average packs/day: 0.6 packs/day for 43.0 years (26.7 ttl pk-yrs)  Types: Cigarettes    Start date: 70    Quit date: 2016    Years since quitting: 9.0   Smokeless tobacco: Never   Tobacco comments:    quit x 1 month 11/16  Vaping Use   Vaping status: Never Used  Substance Use Topics   Alcohol use: Yes    Comment: occasional : 1x/week   Drug use: No     Colonoscopy:  PAP:  Bone density:  Lipid panel:  Allergies   Allergen Reactions   Penicillins Hives    Current Outpatient Medications  Medication Sig Dispense Refill   aspirin EC 81 MG tablet Take 81 mg by mouth in the morning.     buPROPion (WELLBUTRIN XL) 150 MG 24 hr tablet TAKE 1 TABLET BY MOUTH IN THE MORNING. FOR MOOD AND FATIGUE. 90 tablet 1   calcium carbonate (OS-CAL) 600 MG TABS Take 600 mg by mouth every evening.     Coenzyme Q10 (CO Q 10 PO) Take 1 capsule by mouth in the morning.     Fluocinolone Acetonide 0.01 % OIL Apply twice daily to ears as needed for rash/itching 20 mL 2   ibuprofen (ADVIL,MOTRIN) 200 MG tablet Take 400 mg by mouth every 8 (eight) hours as needed (pain.).     ketoconazole (NIZORAL) 2 % shampoo 2-3 times per week lather on scalp and ears, leave on 8-10 minutes, rinse well (Patient taking differently: Apply 1 Application topically once a week. Once per week lather on scalp and ears, leave on 8-10 minutes, rinse well) 120 mL 5   lidocaine-prilocaine (EMLA) cream Apply 1 Application topically as needed.     MAGNESIUM PO Take 1 tablet by mouth every evening.     OMEGA-3 FATTY ACIDS PO Take 1 g by mouth every evening.     simvastatin (ZOCOR) 40 MG tablet TAKE 1 TABLET BY MOUTH EVERY DAY 90 tablet 3   VITAMIN D PO Take 1,000 Units by mouth in the morning.     phenazopyridine (PYRIDIUM) 95 MG tablet Take 95 mg by mouth 3 (three) times daily as needed for pain. (Patient not taking: Reported on 03/12/2023)     No current facility-administered medications for this visit.    OBJECTIVE: Vitals:   03/12/23 0910  BP: (!) 145/95  Pulse: 96  Resp: 16  Temp: 98.2 F (36.8 C)  SpO2: 99%     Body mass index is 26.78 kg/m.    ECOG FS:0 - Asymptomatic  General: Well-developed, well-nourished, no acute distress. Eyes: Pink conjunctiva, anicteric sclera. HEENT: Normocephalic, moist mucous membranes. Lungs: No audible wheezing or coughing. Heart: Regular rate and rhythm.  Faint systolic murmur. Abdomen: Soft, nontender, no  obvious distention. Musculoskeletal: No edema, cyanosis, or clubbing. Neuro: Alert, answering all questions appropriately. Cranial nerves grossly intact. Skin: No rashes or petechiae noted. Psych: Normal affect.   LAB RESULTS:  Lab Results  Component Value Date   NA 137 03/12/2023   K 3.7 03/12/2023   CL 104 03/12/2023   CO2 22 03/12/2023   GLUCOSE 168 (H) 03/12/2023   BUN 17 03/12/2023   CREATININE 0.89 03/12/2023   CALCIUM 9.1 03/12/2023   PROT 7.6 03/12/2023   ALBUMIN 4.0 03/12/2023   AST 28 03/12/2023   ALT 18 03/12/2023   ALKPHOS 76 03/12/2023   BILITOT 0.7 03/12/2023   GFRNONAA >60 03/12/2023   GFRAA >60 01/04/2018    Lab Results  Component Value Date   WBC 5.7 03/12/2023   NEUTROABS 4.1 03/12/2023  HGB 12.7 03/12/2023   HCT 38.5 03/12/2023   MCV 84.4 03/12/2023   PLT 261 03/12/2023     STUDIES: MM 3D SCREENING MAMMOGRAM BILATERAL BREAST Result Date: 03/11/2023 CLINICAL DATA:  Screening. EXAM: DIGITAL SCREENING BILATERAL MAMMOGRAM WITH TOMOSYNTHESIS AND CAD TECHNIQUE: Bilateral screening digital craniocaudal and mediolateral oblique mammograms were obtained. Bilateral screening digital breast tomosynthesis was performed. The images were evaluated with computer-aided detection. COMPARISON:  Previous exam(s). ACR Breast Density Category b: There are scattered areas of fibroglandular density. FINDINGS: There are no findings suspicious for malignancy. IMPRESSION: No mammographic evidence of malignancy. A result letter of this screening mammogram will be mailed directly to the patient. RECOMMENDATION: Screening mammogram in one year. (Code:SM-B-01Y) BI-RADS CATEGORY  1: Negative. Electronically Signed   By: Annia Belt M.D.   On: 03/11/2023 14:24    ASSESSMENT: Stage IIIb adenocarcinoma of the lung.  PLAN:    Stage IIIb adenocarcinoma of the lung: Biopsy from bronchoscopy on June 07, 2022 confirming the diagnosis.  PET scan results from May 22, 2022 reviewed  independently confirming stage of disease.  The bilateral hypermetabolic cervical lymph nodes are suspicious, but will treat patient as a stage IIIb.  MRI of the brain on Jun 19, 2022 did not reveal any metastatic disease.  Patient completed weekly carboplatin and Taxol on July 31, 2022.  Patient now receiving maintenance durvalumab every 2 weeks for 1 year.  She initiated maintenance treatment on August 14, 2022.  PET scan results from November 20, 2022 reviewed independently with interval improvement of disease along with extensive posttreatment changes.  Proceed with cycle 16 of treatment today.  Return to clinic in 2 weeks for treatment only and then in 4 weeks for further evaluation and consideration of cycle 18.    Peripheral neuropathy: Chronic and unchanged.   Insomnia: Intermittent.  Patient states she no longer takes Xanax but was willing to try melatonin. Adjustment/anxiety: Continue Wellbutrin as prescribed.  I spent a total of 30 minutes reviewing chart data, face-to-face evaluation with the patient, counseling and coordination of care as detailed above.  Patient expressed understanding and was in agreement with this plan. She also understands that She can call clinic at any time with any questions, concerns, or complaints.    Cancer Staging  Adenocarcinoma of right lung [facility]) Staging form: Lung, AJCC 8th Edition - Clinical stage from 06/12/2022: Stage IIIB (cT2b, cN3, cM0) - Signed by Jeralyn Ruths, MD on 06/12/2022 Stage prefix: Initial diagnosis   Jeralyn Ruths, MD   03/12/2023 9:41 AM

## 2023-03-13 LAB — T4: T4, Total: 9 ug/dL (ref 4.5–12.0)

## 2023-03-26 ENCOUNTER — Inpatient Hospital Stay: Payer: Medicare Other | Attending: Oncology

## 2023-03-26 VITALS — BP 147/91 | HR 100 | Temp 96.1°F | Resp 17

## 2023-03-26 DIAGNOSIS — Z5112 Encounter for antineoplastic immunotherapy: Secondary | ICD-10-CM | POA: Diagnosis not present

## 2023-03-26 DIAGNOSIS — Z79899 Other long term (current) drug therapy: Secondary | ICD-10-CM | POA: Insufficient documentation

## 2023-03-26 DIAGNOSIS — G4700 Insomnia, unspecified: Secondary | ICD-10-CM | POA: Diagnosis not present

## 2023-03-26 DIAGNOSIS — F419 Anxiety disorder, unspecified: Secondary | ICD-10-CM | POA: Insufficient documentation

## 2023-03-26 DIAGNOSIS — C3411 Malignant neoplasm of upper lobe, right bronchus or lung: Secondary | ICD-10-CM | POA: Diagnosis not present

## 2023-03-26 DIAGNOSIS — Z79620 Long term (current) use of immunosuppressive biologic: Secondary | ICD-10-CM | POA: Insufficient documentation

## 2023-03-26 DIAGNOSIS — C3491 Malignant neoplasm of unspecified part of right bronchus or lung: Secondary | ICD-10-CM

## 2023-03-26 DIAGNOSIS — G629 Polyneuropathy, unspecified: Secondary | ICD-10-CM | POA: Diagnosis not present

## 2023-03-26 MED ORDER — SODIUM CHLORIDE 0.9 % IV SOLN
10.0000 mg/kg | Freq: Once | INTRAVENOUS | Status: AC
  Administered 2023-03-26: 740 mg via INTRAVENOUS
  Filled 2023-03-26: qty 4.8

## 2023-03-26 MED ORDER — HEPARIN SOD (PORK) LOCK FLUSH 100 UNIT/ML IV SOLN
500.0000 [IU] | Freq: Once | INTRAVENOUS | Status: AC | PRN
  Administered 2023-03-26: 500 [IU]
  Filled 2023-03-26: qty 5

## 2023-03-26 MED ORDER — SODIUM CHLORIDE 0.9% FLUSH
10.0000 mL | INTRAVENOUS | Status: DC | PRN
  Administered 2023-03-26: 10 mL
  Filled 2023-03-26: qty 10

## 2023-03-26 MED ORDER — SODIUM CHLORIDE 0.9 % IV SOLN
Freq: Once | INTRAVENOUS | Status: AC
  Filled 2023-03-26: qty 250

## 2023-03-26 NOTE — Patient Instructions (Signed)
[facility] - A DEPT OF [facility]  Discharge Instructions: Thank you for choosing [facility] to provide your oncology and hematology care.  If you have a lab appointment with the Cancer Center, please go directly to the Cancer Center and check in at the registration area.  Wear comfortable clothing and clothing appropriate for easy access to any Portacath or PICC line.   We strive to give you quality time with your provider. You may need to reschedule your appointment if you arrive late (15 or more minutes).  Arriving late affects you and other patients whose appointments are after yours.  Also, if you miss three or more appointments without notifying the office, you may be dismissed from the clinic at the provider's discretion.      For prescription refill requests, have your pharmacy contact our office and allow 72 hours for refills to be completed.    Today you received the following chemotherapy and/or immunotherapy agents imfinzi      To help prevent nausea and vomiting after your treatment, we encourage you to take your nausea medication as directed.  BELOW ARE SYMPTOMS THAT SHOULD BE REPORTED IMMEDIATELY: *FEVER GREATER THAN 100.4 F (38 C) OR HIGHER *CHILLS OR SWEATING *NAUSEA AND VOMITING THAT IS NOT CONTROLLED WITH YOUR NAUSEA MEDICATION *UNUSUAL SHORTNESS OF BREATH *UNUSUAL BRUISING OR BLEEDING *URINARY PROBLEMS (pain or burning when urinating, or frequent urination) *BOWEL PROBLEMS (unusual diarrhea, constipation, pain near the anus) TENDERNESS IN MOUTH AND THROAT WITH OR WITHOUT PRESENCE OF ULCERS (sore throat, sores in mouth, or a toothache) UNUSUAL RASH, SWELLING OR PAIN  UNUSUAL VAGINAL DISCHARGE OR ITCHING   Items with * indicate a potential emergency and should be followed up as soon as possible or go to the Emergency Department if any problems should occur.  Please show the CHEMOTHERAPY ALERT CARD or IMMUNOTHERAPY  ALERT CARD at check-in to the Emergency Department and triage nurse.  Should you have questions after your visit or need to cancel or reschedule your appointment, please contact [facility] - A DEPT OF [facility]  [phone] and follow the prompts.  Office hours are 8:00 a.m. to 4:30 p.m. Monday - Friday. Please note that voicemails left after 4:00 p.m. may not be returned until the following business day.  We are closed weekends and major holidays. You have access to a nurse at all times for urgent questions. Please call the main number to the clinic [phone] and follow the prompts.  For any non-urgent questions, you may also contact your provider using MyChart. We now offer e-Visits for anyone 4 and older to request care online for non-urgent symptoms. For details visit [email or web address].   Also download the MyChart app! Go to the app store, search "MyChart", open the app, select [facility], and log in with your MyChart username and password.

## 2023-04-08 ENCOUNTER — Ambulatory Visit: Payer: Medicare Other | Admitting: Family Medicine

## 2023-04-09 ENCOUNTER — Encounter: Payer: Self-pay | Admitting: Oncology

## 2023-04-09 ENCOUNTER — Inpatient Hospital Stay: Payer: Medicare Other

## 2023-04-09 ENCOUNTER — Inpatient Hospital Stay: Payer: Medicare Other | Admitting: Oncology

## 2023-04-09 ENCOUNTER — Other Ambulatory Visit: Payer: Self-pay | Admitting: Family Medicine

## 2023-04-09 VITALS — BP 151/91 | HR 91

## 2023-04-09 VITALS — BP 141/95 | HR 95 | Temp 98.3°F | Resp 16 | Ht 64.0 in | Wt 157.0 lb

## 2023-04-09 DIAGNOSIS — C3491 Malignant neoplasm of unspecified part of right bronchus or lung: Secondary | ICD-10-CM

## 2023-04-09 DIAGNOSIS — G629 Polyneuropathy, unspecified: Secondary | ICD-10-CM | POA: Diagnosis not present

## 2023-04-09 DIAGNOSIS — Z79899 Other long term (current) drug therapy: Secondary | ICD-10-CM | POA: Diagnosis not present

## 2023-04-09 DIAGNOSIS — Z79620 Long term (current) use of immunosuppressive biologic: Secondary | ICD-10-CM | POA: Diagnosis not present

## 2023-04-09 DIAGNOSIS — G4700 Insomnia, unspecified: Secondary | ICD-10-CM | POA: Diagnosis not present

## 2023-04-09 DIAGNOSIS — F419 Anxiety disorder, unspecified: Secondary | ICD-10-CM | POA: Diagnosis not present

## 2023-04-09 DIAGNOSIS — Z5112 Encounter for antineoplastic immunotherapy: Secondary | ICD-10-CM | POA: Diagnosis not present

## 2023-04-09 DIAGNOSIS — C3411 Malignant neoplasm of upper lobe, right bronchus or lung: Secondary | ICD-10-CM | POA: Diagnosis not present

## 2023-04-09 LAB — CBC WITH DIFFERENTIAL (CANCER CENTER ONLY)
Abs Immature Granulocytes: 0.01 10*3/uL (ref 0.00–0.07)
Basophils Absolute: 0 10*3/uL (ref 0.0–0.1)
Basophils Relative: 1 %
Eosinophils Absolute: 0.1 10*3/uL (ref 0.0–0.5)
Eosinophils Relative: 2 %
HCT: 38.4 % (ref 36.0–46.0)
Hemoglobin: 12.7 g/dL (ref 12.0–15.0)
Immature Granulocytes: 0 %
Lymphocytes Relative: 19 %
Lymphs Abs: 0.8 10*3/uL (ref 0.7–4.0)
MCH: 28.6 pg (ref 26.0–34.0)
MCHC: 33.1 g/dL (ref 30.0–36.0)
MCV: 86.5 fL (ref 80.0–100.0)
Monocytes Absolute: 0.5 10*3/uL (ref 0.1–1.0)
Monocytes Relative: 11 %
Neutro Abs: 2.9 10*3/uL (ref 1.7–7.7)
Neutrophils Relative %: 67 %
Platelet Count: 248 10*3/uL (ref 150–400)
RBC: 4.44 MIL/uL (ref 3.87–5.11)
RDW: 13.7 % (ref 11.5–15.5)
WBC Count: 4.3 10*3/uL (ref 4.0–10.5)
nRBC: 0 % (ref 0.0–0.2)

## 2023-04-09 LAB — CMP (CANCER CENTER ONLY)
ALT: 21 U/L (ref 0–44)
AST: 25 U/L (ref 15–41)
Albumin: 4 g/dL (ref 3.5–5.0)
Alkaline Phosphatase: 77 U/L (ref 38–126)
Anion gap: 8 (ref 5–15)
BUN: 15 mg/dL (ref 8–23)
CO2: 25 mmol/L (ref 22–32)
Calcium: 9.2 mg/dL (ref 8.9–10.3)
Chloride: 105 mmol/L (ref 98–111)
Creatinine: 0.78 mg/dL (ref 0.44–1.00)
GFR, Estimated: >60 mL/min (ref >60)
Glucose, Bld: 119 mg/dL — ABNORMAL HIGH (ref 70–99)
Potassium: 4 mmol/L (ref 3.5–5.1)
Sodium: 138 mmol/L (ref 135–145)
Total Bilirubin: 0.7 mg/dL (ref 0.0–1.2)
Total Protein: 7.3 g/dL (ref 6.5–8.1)

## 2023-04-09 LAB — TSH: TSH: 2.839 u[IU]/mL (ref 0.350–4.500)

## 2023-04-09 MED ORDER — SODIUM CHLORIDE 0.9 % IV SOLN
Freq: Once | INTRAVENOUS | Status: AC
  Filled 2023-04-09: qty 250

## 2023-04-09 MED ORDER — HEPARIN SOD (PORK) LOCK FLUSH 100 UNIT/ML IV SOLN
500.0000 [IU] | Freq: Once | INTRAVENOUS | Status: AC | PRN
  Administered 2023-04-09: 500 [IU]
  Filled 2023-04-09: qty 5

## 2023-04-09 MED ORDER — SODIUM CHLORIDE 0.9 % IV SOLN
10.0000 mg/kg | Freq: Once | INTRAVENOUS | Status: AC
  Administered 2023-04-09: 740 mg via INTRAVENOUS
  Filled 2023-04-09: qty 4.8

## 2023-04-09 NOTE — Progress Notes (Signed)
 [facility]  Telephone:(336) [phone] Fax:(336) [phone]  ID: Connie West OB: 11/12/1955  MR#: [id number]  AOZ#:[id number]  Patient Care Team: Excell Seltzer, MD as PCP - General Glory Buff, RN as Oncology Nurse Navigator Orlie Dakin, Tollie Pizza, MD as Consulting Physician (Oncology) Pa, [name]  CHIEF COMPLAINT: Stage IIIb adenocarcinoma of the lung.  INTERVAL HISTORY: Patient returns to clinic today for further evaluation and consideration of cycle 8 of maintenance durvalumab.  She currently feels well and is asymptomatic.  She continues to tolerate her treatments well without significant side effects.  She continues to have a mild peripheral neuropathy, that does not affect her day-to-day activity.  She has no other neurologic complaints.  She denies any recent fevers or illnesses.  She has a good appetite and denies weight loss.  She has no chest pain, shortness of breath, cough, or hemoptysis.  She denies any nausea, vomiting, constipation, or diarrhea.  She has no urinary complaints.  Patient offers no further specific complaints today.  REVIEW OF SYSTEMS:   Review of Systems  Constitutional: Negative.  Negative for fever, malaise/fatigue and weight loss.  Respiratory: Negative.  Negative for cough, hemoptysis and shortness of breath.   Cardiovascular: Negative.  Negative for chest pain and leg swelling.  Gastrointestinal: Negative.  Negative for abdominal pain.  Genitourinary: Negative.  Negative for dysuria.  Musculoskeletal: Negative.  Negative for back pain and joint pain.  Skin: Negative.  Negative for rash.  Neurological:  Positive for tingling and sensory change. Negative for dizziness, focal weakness, weakness and headaches.  Psychiatric/Behavioral: Negative.  The patient is not nervous/anxious and does not have insomnia.     As per HPI. Otherwise, a complete review of systems is negative.  PAST MEDICAL HISTORY: Past Medical History:   Diagnosis Date   Complication of anesthesia    Hyperlipidemia    Lung cancer, main bronchus, right (HCC)    Personal history of chemotherapy    Personal history of radiation therapy    PONV (postoperative nausea and vomiting)    TIA (transient ischemic attack)     PAST SURGICAL HISTORY: Past Surgical History:  Procedure Laterality Date   BREAST CYST ASPIRATION Right 07/20/2012   FNA benign   BREAST CYST ASPIRATION Right    BREAST SURGERY Right 07-20-12   FNA benign   BRONCHIAL NEEDLE ASPIRATION BIOPSY  06/07/2022   Procedure: BRONCHIAL NEEDLE ASPIRATION BIOPSIES;  Surgeon: Raechel Chute, MD;  Location: [facility];  Service: Pulmonary;;   CESAREAN SECTION     CHOLECYSTECTOMY     COLONOSCOPY WITH PROPOFOL N/A 03/21/2021   Procedure: COLONOSCOPY WITH PROPOFOL;  Surgeon: Toney Reil, MD;  Location: [facility] END[facility];  Service: Gastroenterology;  Laterality: N/A;   IR IMAGING GUIDED PORT INSERTION  06/21/2022   OVARY SURGERY     VIDEO BRONCHOSCOPY WITH ENDOBRONCHIAL ULTRASOUND N/A 06/07/2022   Procedure: VIDEO BRONCHOSCOPY WITH ENDOBRONCHIAL ULTRASOUND;  Surgeon: Raechel Chute, MD;  Location: [facility];  Service: Pulmonary;  Laterality: N/A;    FAMILY HISTORY: Family History  Problem Relation Age of Onset   Breast cancer Mother 28   Cancer Mother        breast   Cancer Father        bone cancer    ADVANCED DIRECTIVES (Y/N):  N  HEALTH MAINTENANCE: Social History   Tobacco Use   Smoking status: Former    Current packs/day: 0.00    Average packs/day: 0.6 packs/day for 43.0 years (26.7 ttl pk-yrs)  Types: Cigarettes    Start date: 15    Quit date: 2016    Years since quitting: 9.1   Smokeless tobacco: Never   Tobacco comments:    quit x 1 month 11/16  Vaping Use   Vaping status: Never Used  Substance Use Topics   Alcohol use: Yes    Comment: occasional : 1x/week   Drug use: No     Colonoscopy:  PAP:  Bone density:  Lipid panel:  Allergies   Allergen Reactions   Penicillins Hives    Current Outpatient Medications  Medication Sig Dispense Refill   aspirin EC 81 MG tablet Take 81 mg by mouth in the morning.     buPROPion (WELLBUTRIN XL) 150 MG 24 hr tablet TAKE 1 TABLET BY MOUTH IN THE MORNING. FOR MOOD AND FATIGUE. 90 tablet 1   calcium carbonate (OS-CAL) 600 MG TABS Take 600 mg by mouth every evening.     Coenzyme Q10 (CO Q 10 PO) Take 1 capsule by mouth in the morning.     Fluocinolone Acetonide 0.01 % OIL Apply twice daily to ears as needed for rash/itching 20 mL 2   ibuprofen (ADVIL,MOTRIN) 200 MG tablet Take 400 mg by mouth every 8 (eight) hours as needed (pain.).     ketoconazole (NIZORAL) 2 % shampoo 2-3 times per week lather on scalp and ears, leave on 8-10 minutes, rinse well (Patient taking differently: Apply 1 Application topically once a week. Once per week lather on scalp and ears, leave on 8-10 minutes, rinse well) 120 mL 5   lidocaine-prilocaine (EMLA) cream Apply 1 Application topically as needed.     MAGNESIUM PO Take 1 tablet by mouth every evening.     OMEGA-3 FATTY ACIDS PO Take 1 g by mouth every evening.     simvastatin (ZOCOR) 40 MG tablet TAKE 1 TABLET BY MOUTH EVERY DAY 90 tablet 3   VITAMIN D PO Take 1,000 Units by mouth in the morning.     phenazopyridine (PYRIDIUM) 95 MG tablet Take 95 mg by mouth 3 (three) times daily as needed for pain. (Patient not taking: Reported on 04/09/2023)     No current facility-administered medications for this visit.    OBJECTIVE: Vitals:   04/09/23 0902  BP: (!) 141/95  Pulse: 95  Resp: 16  Temp: 98.3 F (36.8 C)  SpO2: 98%     Body mass index is 26.95 kg/m.    ECOG FS:0 - Asymptomatic  General: Well-developed, well-nourished, no acute distress. Eyes: Pink conjunctiva, anicteric sclera. HEENT: Normocephalic, moist mucous membranes. Lungs: No audible wheezing or coughing. Heart: Regular rate and rhythm. Abdomen: Soft, nontender, no obvious  distention. Musculoskeletal: No edema, cyanosis, or clubbing. Neuro: Alert, answering all questions appropriately. Cranial nerves grossly intact. Skin: No rashes or petechiae noted. Psych: Normal affect.  LAB RESULTS:  Lab Results  Component Value Date   NA 137 03/12/2023   K 3.7 03/12/2023   CL 104 03/12/2023   CO2 22 03/12/2023   GLUCOSE 168 (H) 03/12/2023   BUN 17 03/12/2023   CREATININE 0.89 03/12/2023   CALCIUM 9.1 03/12/2023   PROT 7.6 03/12/2023   ALBUMIN 4.0 03/12/2023   AST 28 03/12/2023   ALT 18 03/12/2023   ALKPHOS 76 03/12/2023   BILITOT 0.7 03/12/2023   GFRNONAA >60 03/12/2023   GFRAA >60 01/04/2018    Lab Results  Component Value Date   WBC 4.3 04/09/2023   NEUTROABS 2.9 04/09/2023   HGB 12.7 04/09/2023  HCT 38.4 04/09/2023   MCV 86.5 04/09/2023   PLT 248 04/09/2023     STUDIES: MM 3D SCREENING MAMMOGRAM BILATERAL BREAST Result Date: 03/11/2023 CLINICAL DATA:  Screening. EXAM: DIGITAL SCREENING BILATERAL MAMMOGRAM WITH TOMOSYNTHESIS AND CAD TECHNIQUE: Bilateral screening digital craniocaudal and mediolateral oblique mammograms were obtained. Bilateral screening digital breast tomosynthesis was performed. The images were evaluated with computer-aided detection. COMPARISON:  Previous exam(s). ACR Breast Density Category b: There are scattered areas of fibroglandular density. FINDINGS: There are no findings suspicious for malignancy. IMPRESSION: No mammographic evidence of malignancy. A result letter of this screening mammogram will be mailed directly to the patient. RECOMMENDATION: Screening mammogram in one year. (Code:SM-B-01Y) BI-RADS CATEGORY  1: Negative. Electronically Signed   By: Annia Belt M.D.   On: 03/11/2023 14:24    ASSESSMENT: Stage IIIb adenocarcinoma of the lung.  PLAN:    Stage IIIb adenocarcinoma of the lung: Biopsy from bronchoscopy on June 07, 2022 confirming the diagnosis.  PET scan results from May 22, 2022 reviewed independently  confirming stage of disease.  The bilateral hypermetabolic cervical lymph nodes are suspicious, but will treat patient as a stage IIIb.  MRI of the brain on Jun 19, 2022 did not reveal any metastatic disease.  Patient completed weekly carboplatin and Taxol on July 31, 2022.  Patient now receiving maintenance durvalumab every 2 weeks for 1 year.  She initiated maintenance treatment on August 14, 2022.  PET scan results from November 20, 2022 reviewed independently with interval improvement of disease along with extensive posttreatment changes.  Proceed with cycle 18 of treatment today.  Return to clinic in 2 weeks for treatment only and then in 4 weeks for further evaluation and consideration of cycle 20.  Consider repeat imaging with CT scan in April 2025.    Peripheral neuropathy: Chronic and unchanged.   Insomnia: Patient does not complain of this today.   Adjustment/anxiety: Continue Wellbutrin as prescribed.  I spent a total of 30 minutes reviewing chart data, face-to-face evaluation with the patient, counseling and coordination of care as detailed above.   Patient expressed understanding and was in agreement with this plan. She also understands that She can call clinic at any time with any questions, concerns, or complaints.    Cancer Staging  Adenocarcinoma of right lung [facility]) Staging form: Lung, AJCC 8th Edition - Clinical stage from 06/12/2022: Stage IIIB (cT2b, cN3, cM0) - Signed by Jeralyn Ruths, MD on 06/12/2022 Stage prefix: Initial diagnosis   Jeralyn Ruths, MD   04/09/2023 9:25 AM

## 2023-04-09 NOTE — Patient Instructions (Signed)

## 2023-04-18 LAB — T4: T4, Total: 8.5 ug/dL (ref 4.5–12.0)

## 2023-04-23 ENCOUNTER — Inpatient Hospital Stay: Payer: Medicare Other | Attending: Oncology

## 2023-04-23 VITALS — BP 117/68 | HR 90 | Temp 98.3°F | Resp 18 | Ht 64.0 in | Wt 156.3 lb

## 2023-04-23 DIAGNOSIS — C3411 Malignant neoplasm of upper lobe, right bronchus or lung: Secondary | ICD-10-CM | POA: Insufficient documentation

## 2023-04-23 DIAGNOSIS — Z79899 Other long term (current) drug therapy: Secondary | ICD-10-CM | POA: Insufficient documentation

## 2023-04-23 DIAGNOSIS — N390 Urinary tract infection, site not specified: Secondary | ICD-10-CM | POA: Diagnosis not present

## 2023-04-23 DIAGNOSIS — Z5112 Encounter for antineoplastic immunotherapy: Secondary | ICD-10-CM | POA: Diagnosis not present

## 2023-04-23 DIAGNOSIS — Z79620 Long term (current) use of immunosuppressive biologic: Secondary | ICD-10-CM | POA: Insufficient documentation

## 2023-04-23 DIAGNOSIS — F419 Anxiety disorder, unspecified: Secondary | ICD-10-CM | POA: Diagnosis not present

## 2023-04-23 DIAGNOSIS — G629 Polyneuropathy, unspecified: Secondary | ICD-10-CM | POA: Diagnosis not present

## 2023-04-23 DIAGNOSIS — C3491 Malignant neoplasm of unspecified part of right bronchus or lung: Secondary | ICD-10-CM

## 2023-04-23 MED ORDER — HEPARIN SOD (PORK) LOCK FLUSH 100 UNIT/ML IV SOLN
500.0000 [IU] | Freq: Once | INTRAVENOUS | Status: AC | PRN
Start: 2023-04-23 — End: 2023-04-23
  Administered 2023-04-23: 500 [IU]
  Filled 2023-04-23: qty 5

## 2023-04-23 MED ORDER — SODIUM CHLORIDE 0.9 % IV SOLN
10.0000 mg/kg | Freq: Once | INTRAVENOUS | Status: AC
  Administered 2023-04-23: 740 mg via INTRAVENOUS
  Filled 2023-04-23: qty 4.8

## 2023-04-23 MED ORDER — SODIUM CHLORIDE 0.9 % IV SOLN
Freq: Once | INTRAVENOUS | Status: AC
  Filled 2023-04-23: qty 250

## 2023-04-23 NOTE — Patient Instructions (Signed)
 [facility] - A DEPT OF [facility]  Discharge Instructions: Thank you for choosing [facility] to provide your oncology and hematology care.  If you have a lab appointment with the Cancer Center, please go directly to the Cancer Center and check in at the registration area.  Wear comfortable clothing and clothing appropriate for easy access to any Portacath or PICC line.   We strive to give you quality time with your provider. You may need to reschedule your appointment if you arrive late (15 or more minutes).  Arriving late affects you and other patients whose appointments are after yours.  Also, if you miss three or more appointments without notifying the office, you may be dismissed from the clinic at the provider's discretion.      For prescription refill requests, have your pharmacy contact our office and allow 72 hours for refills to be completed.    Today you received the following chemotherapy and/or immunotherapy agents IMFINZI      To help prevent nausea and vomiting after your treatment, we encourage you to take your nausea medication as directed.  BELOW ARE SYMPTOMS THAT SHOULD BE REPORTED IMMEDIATELY: *FEVER GREATER THAN 100.4 F (38 C) OR HIGHER *CHILLS OR SWEATING *NAUSEA AND VOMITING THAT IS NOT CONTROLLED WITH YOUR NAUSEA MEDICATION *UNUSUAL SHORTNESS OF BREATH *UNUSUAL BRUISING OR BLEEDING *URINARY PROBLEMS (pain or burning when urinating, or frequent urination) *BOWEL PROBLEMS (unusual diarrhea, constipation, pain near the anus) TENDERNESS IN MOUTH AND THROAT WITH OR WITHOUT PRESENCE OF ULCERS (sore throat, sores in mouth, or a toothache) UNUSUAL RASH, SWELLING OR PAIN  UNUSUAL VAGINAL DISCHARGE OR ITCHING   Items with * indicate a potential emergency and should be followed up as soon as possible or go to the Emergency Department if any problems should occur.  Please show the CHEMOTHERAPY ALERT CARD or IMMUNOTHERAPY  ALERT CARD at check-in to the Emergency Department and triage nurse.  Should you have questions after your visit or need to cancel or reschedule your appointment, please contact [facility] - A DEPT OF [facility]  [phone] and follow the prompts.  Office hours are 8:00 a.m. to 4:30 p.m. Monday - Friday. Please note that voicemails left after 4:00 p.m. may not be returned until the following business day.  We are closed weekends and major holidays. You have access to a nurse at all times for urgent questions. Please call the main number to the clinic [phone] and follow the prompts.  For any non-urgent questions, you may also contact your provider using MyChart. We now offer e-Visits for anyone 30 and older to request care online for non-urgent symptoms. For details visit [email or web address].   Also download the MyChart app! Go to the app store, search "MyChart", open the app, select [facility], and log in with your MyChart username and password.  Durvalumab Injection What is this medication? DURVALUMAB (dur VAL ue mab) treats some types of cancer. It works by helping your immune system slow or stop the spread of cancer cells. It is a monoclonal antibody. This medicine may be used for other purposes; ask your health care provider or pharmacist if you have questions. COMMON BRAND NAME(S): IMFINZI What should I tell my care team before I take this medication? They need to know if you have any of these conditions: Allogeneic stem cell transplant (uses someone else's stem cells) Autoimmune diseases, such as Crohn disease, ulcerative  colitis, lupus History of chest radiation Nervous system problems, such as Guillain-Barre syndrome, myasthenia gravis Organ transplant An unusual or allergic reaction to durvalumab, other medications, foods, dyes, or preservatives Pregnant or trying to get pregnant Breast-feeding How should I use this medication? This  medication is infused into a vein. It is given by your care team in a hospital or clinic setting. A special MedGuide will be given to you before each treatment. Be sure to read this information carefully each time. Talk to your care team about the use of this medication in children. Special care may be needed. Overdosage: If you think you have taken too much of this medicine contact a poison control center or emergency room at once. NOTE: This medicine is only for you. Do not share this medicine with others. What if I miss a dose? Keep appointments for follow-up doses. It is important not to miss your dose. Call your care team if you are unable to keep an appointment. What may interact with this medication? Interactions have not been studied. This list may not describe all possible interactions. Give your health care provider a list of all the medicines, herbs, non-prescription drugs, or dietary supplements you use. Also tell them if you smoke, drink alcohol, or use illegal drugs. Some items may interact with your medicine. What should I watch for while using this medication? Your condition will be monitored carefully while you are receiving this medication. You may need blood work while taking this medication. This medication may cause serious skin reactions. They can happen weeks to months after starting the medication. Contact your care team right away if you notice fevers or flu-like symptoms with a rash. The rash may be red or purple and then turn into blisters or peeling of the skin. You may also notice a red rash with swelling of the face, lips, or lymph nodes in your neck or under your arms. Tell your care team right away if you have any change in your eyesight. Talk to your care team if you may be pregnant. Serious birth defects can occur if you take this medication during pregnancy and for 3 months after the last dose. You will need a negative pregnancy test before starting this medication.  Contraception is recommended while taking this medication and for 3 months after the last dose. Your care team can help you find the option that works for you. Do not breastfeed while taking this medication and for 3 months after the last dose. What side effects may I notice from receiving this medication? Side effects that you should report to your care team as soon as possible: Allergic reactions--skin rash, itching, hives, swelling of the face, lips, tongue, or throat Dry cough, shortness of breath or trouble breathing Eye pain, redness, irritation, or discharge with blurry or decreased vision Heart muscle inflammation--unusual weakness or fatigue, shortness of breath, chest pain, fast or irregular heartbeat, dizziness, swelling of the ankles, feet, or hands Hormone gland problems--headache, sensitivity to light, unusual weakness or fatigue, dizziness, fast or irregular heartbeat, increased sensitivity to cold or heat, excessive sweating, constipation, hair loss, increased thirst or amount of urine, tremors or shaking, irritability Infusion reactions--chest pain, shortness of breath or trouble breathing, feeling faint or lightheaded Kidney injury (glomerulonephritis)--decrease in the amount of urine, red or dark brown urine, foamy or bubbly urine, swelling of the ankles, hands, or feet Liver injury--right upper belly pain, loss of appetite, nausea, light-colored stool, dark yellow or brown urine, yellowing skin or eyes,  unusual weakness or fatigue Pain, tingling, or numbness in the hands or feet, muscle weakness, change in vision, confusion or trouble speaking, loss of balance or coordination, trouble walking, seizures Rash, fever, and swollen lymph nodes Redness, blistering, peeling, or loosening of the skin, including inside the mouth Sudden or severe stomach pain, bloody diarrhea, fever, nausea, vomiting Side effects that usually do not require medical attention (report these to your care team  if they continue or are bothersome): Bone, joint, or muscle pain Diarrhea Fatigue Loss of appetite Nausea Skin rash This list may not describe all possible side effects. Call your doctor for medical advice about side effects. You may report side effects to FDA at 1-800-FDA-1088. Where should I keep my medication? This medication is given in a hospital or clinic. It will not be stored at home. NOTE: This sheet is a summary. It may not cover all possible information. If you have questions about this medicine, talk to your doctor, pharmacist, or health care provider.  2024 Elsevier/Gold Standard (2021-06-12 00:00:00)

## 2023-05-07 ENCOUNTER — Encounter: Payer: Self-pay | Admitting: *Deleted

## 2023-05-07 ENCOUNTER — Other Ambulatory Visit: Payer: Self-pay | Admitting: Lab

## 2023-05-07 ENCOUNTER — Inpatient Hospital Stay: Payer: Medicare Other

## 2023-05-07 ENCOUNTER — Encounter: Payer: Self-pay | Admitting: Oncology

## 2023-05-07 ENCOUNTER — Inpatient Hospital Stay: Payer: Medicare Other | Admitting: Oncology

## 2023-05-07 VITALS — BP 146/90 | HR 98 | Temp 96.7°F | Resp 16 | Ht 64.0 in | Wt 158.4 lb

## 2023-05-07 DIAGNOSIS — C3491 Malignant neoplasm of unspecified part of right bronchus or lung: Secondary | ICD-10-CM | POA: Diagnosis not present

## 2023-05-07 DIAGNOSIS — R300 Dysuria: Secondary | ICD-10-CM | POA: Diagnosis not present

## 2023-05-07 DIAGNOSIS — C3411 Malignant neoplasm of upper lobe, right bronchus or lung: Secondary | ICD-10-CM | POA: Diagnosis not present

## 2023-05-07 DIAGNOSIS — Z79620 Long term (current) use of immunosuppressive biologic: Secondary | ICD-10-CM | POA: Diagnosis not present

## 2023-05-07 DIAGNOSIS — Z79899 Other long term (current) drug therapy: Secondary | ICD-10-CM | POA: Diagnosis not present

## 2023-05-07 DIAGNOSIS — N390 Urinary tract infection, site not specified: Secondary | ICD-10-CM | POA: Diagnosis not present

## 2023-05-07 DIAGNOSIS — Z5112 Encounter for antineoplastic immunotherapy: Secondary | ICD-10-CM | POA: Diagnosis not present

## 2023-05-07 DIAGNOSIS — F419 Anxiety disorder, unspecified: Secondary | ICD-10-CM | POA: Diagnosis not present

## 2023-05-07 DIAGNOSIS — G629 Polyneuropathy, unspecified: Secondary | ICD-10-CM | POA: Diagnosis not present

## 2023-05-07 LAB — URINALYSIS, COMPLETE (UACMP) WITH MICROSCOPIC
Bilirubin Urine: NEGATIVE
Glucose, UA: NEGATIVE mg/dL
Hgb urine dipstick: NEGATIVE
Ketones, ur: NEGATIVE mg/dL
Nitrite: NEGATIVE
Protein, ur: NEGATIVE mg/dL
RBC / HPF: >50 RBC/hpf (ref 0–5)
Specific Gravity, Urine: 1.008 (ref 1.005–1.030)
WBC, UA: >50 WBC/hpf (ref 0–5)
pH: 6 (ref 5.0–8.0)

## 2023-05-07 LAB — CBC WITH DIFFERENTIAL (CANCER CENTER ONLY)
Abs Immature Granulocytes: 0.01 10*3/uL (ref 0.00–0.07)
Basophils Absolute: 0 10*3/uL (ref 0.0–0.1)
Basophils Relative: 1 %
Eosinophils Absolute: 0.1 10*3/uL (ref 0.0–0.5)
Eosinophils Relative: 2 %
HCT: 38 % (ref 36.0–46.0)
Hemoglobin: 12.5 g/dL (ref 12.0–15.0)
Immature Granulocytes: 0 %
Lymphocytes Relative: 25 %
Lymphs Abs: 1.1 10*3/uL (ref 0.7–4.0)
MCH: 28.8 pg (ref 26.0–34.0)
MCHC: 32.9 g/dL (ref 30.0–36.0)
MCV: 87.6 fL (ref 80.0–100.0)
Monocytes Absolute: 0.4 10*3/uL (ref 0.1–1.0)
Monocytes Relative: 9 %
Neutro Abs: 2.8 10*3/uL (ref 1.7–7.7)
Neutrophils Relative %: 63 %
Platelet Count: 254 10*3/uL (ref 150–400)
RBC: 4.34 MIL/uL (ref 3.87–5.11)
RDW: 12.9 % (ref 11.5–15.5)
WBC Count: 4.5 10*3/uL (ref 4.0–10.5)
nRBC: 0 % (ref 0.0–0.2)

## 2023-05-07 LAB — CMP (CANCER CENTER ONLY)
ALT: 19 U/L (ref 0–44)
AST: 31 U/L (ref 15–41)
Albumin: 3.8 g/dL (ref 3.5–5.0)
Alkaline Phosphatase: 84 U/L (ref 38–126)
Anion gap: 11 (ref 5–15)
BUN: 17 mg/dL (ref 8–23)
CO2: 22 mmol/L (ref 22–32)
Calcium: 8.9 mg/dL (ref 8.9–10.3)
Chloride: 104 mmol/L (ref 98–111)
Creatinine: 1.06 mg/dL — ABNORMAL HIGH (ref 0.44–1.00)
GFR, Estimated: 58 mL/min — ABNORMAL LOW (ref >60)
Glucose, Bld: 168 mg/dL — ABNORMAL HIGH (ref 70–99)
Potassium: 3.7 mmol/L (ref 3.5–5.1)
Sodium: 137 mmol/L (ref 135–145)
Total Bilirubin: 0.5 mg/dL (ref 0.0–1.2)
Total Protein: 7.2 g/dL (ref 6.5–8.1)

## 2023-05-07 LAB — TSH: TSH: 2.941 u[IU]/mL (ref 0.350–4.500)

## 2023-05-07 MED ORDER — SODIUM CHLORIDE 0.9 % IV SOLN
Freq: Once | INTRAVENOUS | Status: AC
Start: 2023-05-07 — End: 2023-05-07
  Filled 2023-05-07: qty 250

## 2023-05-07 MED ORDER — SODIUM CHLORIDE 0.9 % IV SOLN
10.0000 mg/kg | Freq: Once | INTRAVENOUS | Status: AC
  Administered 2023-05-07: 740 mg via INTRAVENOUS
  Filled 2023-05-07: qty 4.8

## 2023-05-07 MED ORDER — SULFAMETHOXAZOLE-TRIMETHOPRIM 800-160 MG PO TABS: 1.0000 | ORAL_TABLET | Freq: Two times a day (BID) | ORAL | 0 refills | Status: DC

## 2023-05-07 MED ORDER — HEPARIN SOD (PORK) LOCK FLUSH 100 UNIT/ML IV SOLN
500.0000 [IU] | Freq: Once | INTRAVENOUS | Status: DC | PRN
  Filled 2023-05-07: qty 5

## 2023-05-07 NOTE — Patient Instructions (Signed)
 [facility] - A DEPT OF [facility]  Discharge Instructions: Thank you for choosing [facility] to provide your oncology and hematology care.  If you have a lab appointment with the Cancer Center, please go directly to the Cancer Center and check in at the registration area.  Wear comfortable clothing and clothing appropriate for easy access to any Portacath or PICC line.   We strive to give you quality time with your provider. You may need to reschedule your appointment if you arrive late (15 or more minutes).  Arriving late affects you and other patients whose appointments are after yours.  Also, if you miss three or more appointments without notifying the office, you may be dismissed from the clinic at the provider's discretion.      For prescription refill requests, have your pharmacy contact our office and allow 72 hours for refills to be completed.    Today you received the following chemotherapy and/or immunotherapy agents Imfinzi      To help prevent nausea and vomiting after your treatment, we encourage you to take your nausea medication as directed.  BELOW ARE SYMPTOMS THAT SHOULD BE REPORTED IMMEDIATELY: *FEVER GREATER THAN 100.4 F (38 C) OR HIGHER *CHILLS OR SWEATING *NAUSEA AND VOMITING THAT IS NOT CONTROLLED WITH YOUR NAUSEA MEDICATION *UNUSUAL SHORTNESS OF BREATH *UNUSUAL BRUISING OR BLEEDING *URINARY PROBLEMS (pain or burning when urinating, or frequent urination) *BOWEL PROBLEMS (unusual diarrhea, constipation, pain near the anus) TENDERNESS IN MOUTH AND THROAT WITH OR WITHOUT PRESENCE OF ULCERS (sore throat, sores in mouth, or a toothache) UNUSUAL RASH, SWELLING OR PAIN  UNUSUAL VAGINAL DISCHARGE OR ITCHING   Items with * indicate a potential emergency and should be followed up as soon as possible or go to the Emergency Department if any problems should occur.  Please show the CHEMOTHERAPY ALERT CARD or IMMUNOTHERAPY  ALERT CARD at check-in to the Emergency Department and triage nurse.  Should you have questions after your visit or need to cancel or reschedule your appointment, please contact [facility] - A DEPT OF [facility]  [phone] and follow the prompts.  Office hours are 8:00 a.m. to 4:30 p.m. Monday - Friday. Please note that voicemails left after 4:00 p.m. may not be returned until the following business day.  We are closed weekends and major holidays. You have access to a nurse at all times for urgent questions. Please call the main number to the clinic [phone] and follow the prompts.  For any non-urgent questions, you may also contact your provider using MyChart. We now offer e-Visits for anyone 1 and older to request care online for non-urgent symptoms. For details visit [email or web address].   Also download the MyChart app! Go to the app store, search "MyChart", open the app, select [place], and log in with your MyChart username and password.

## 2023-05-07 NOTE — Progress Notes (Signed)
 Feels like she has a UTI. Has urinary frequency and has dysuria x1 week.

## 2023-05-07 NOTE — Addendum Note (Signed)
 Addended by: Luz Lex on: 05/07/2023 11:09 AM   Modules accepted: Orders

## 2023-05-07 NOTE — Progress Notes (Signed)
 [facility]  Telephone:(336) [phone] Fax:(336) [phone]  ID: Connie West OB: Jul 03, 1955  MR#: [id number]  YQI#:[id number]  Patient Care Team: Excell Seltzer, MD as PCP - General Glory Buff, RN as Oncology Nurse Navigator Orlie Dakin, Tollie Pizza, MD as Consulting Physician (Oncology) Pa, [name]  CHIEF COMPLAINT: Stage IIIb adenocarcinoma of the lung.  INTERVAL HISTORY: Patient returns to clinic today for further evaluation and continuation of maintenance durvalumab.  She reports symptoms of UTI, but otherwise feels well.  She continues to tolerate her treatments without significant side effects.  Her peripheral neuropathy is unchanged.  She has no other neurologic complaints.  She denies any recent fevers or illnesses.  She has a good appetite and denies weight loss.  She has no chest pain, shortness of breath, cough, or hemoptysis.  She denies any nausea, vomiting, constipation, or diarrhea.  She has no urinary complaints.  Patient offers no further specific complaints today.  REVIEW OF SYSTEMS:   Review of Systems  Constitutional: Negative.  Negative for fever, malaise/fatigue and weight loss.  Respiratory: Negative.  Negative for cough, hemoptysis and shortness of breath.   Cardiovascular: Negative.  Negative for chest pain and leg swelling.  Gastrointestinal: Negative.  Negative for abdominal pain.  Genitourinary:  Positive for frequency and urgency. Negative for dysuria.  Musculoskeletal: Negative.  Negative for back pain and joint pain.  Skin: Negative.  Negative for rash.  Neurological:  Positive for tingling and sensory change. Negative for dizziness, focal weakness, weakness and headaches.  Psychiatric/Behavioral: Negative.  The patient is not nervous/anxious and does not have insomnia.     As per HPI. Otherwise, a complete review of systems is negative.  PAST MEDICAL HISTORY: Past Medical History:  Diagnosis Date   Complication of  anesthesia    Hyperlipidemia    Lung cancer, main bronchus, right (HCC)    Personal history of chemotherapy    Personal history of radiation therapy    PONV (postoperative nausea and vomiting)    TIA (transient ischemic attack)     PAST SURGICAL HISTORY: Past Surgical History:  Procedure Laterality Date   BREAST CYST ASPIRATION Right 07/20/2012   FNA benign   BREAST CYST ASPIRATION Right    BREAST SURGERY Right 07-20-12   FNA benign   BRONCHIAL NEEDLE ASPIRATION BIOPSY  06/07/2022   Procedure: BRONCHIAL NEEDLE ASPIRATION BIOPSIES;  Surgeon: Raechel Chute, MD;  Location: [facility];  Service: Pulmonary;;   CESAREAN SECTION     CHOLECYSTECTOMY     COLONOSCOPY WITH PROPOFOL N/A 03/21/2021   Procedure: COLONOSCOPY WITH PROPOFOL;  Surgeon: Toney Reil, MD;  Location: [facility] ENDOSCOPY;  Service: Gastroenterology;  Laterality: N/A;   IR IMAGING GUIDED PORT INSERTION  06/21/2022   OVARY SURGERY     VIDEO BRONCHOSCOPY WITH ENDOBRONCHIAL ULTRASOUND N/A 06/07/2022   Procedure: VIDEO BRONCHOSCOPY WITH ENDOBRONCHIAL ULTRASOUND;  Surgeon: Raechel Chute, MD;  Location: [facility];  Service: Pulmonary;  Laterality: N/A;    FAMILY HISTORY: Family History  Problem Relation Age of Onset   Breast cancer Mother 8   Cancer Mother        breast   Cancer Father        bone cancer    ADVANCED DIRECTIVES (Y/N):  N  HEALTH MAINTENANCE: Social History   Tobacco Use   Smoking status: Former    Current packs/day: 0.00    Average packs/day: 0.6 packs/day for 43.0 years (26.7 ttl pk-yrs)    Types: Cigarettes  Start date: 21    Quit date: 2016    Years since quitting: 9.2   Smokeless tobacco: Never   Tobacco comments:    quit x 1 month 11/16  Vaping Use   Vaping status: Never Used  Substance Use Topics   Alcohol use: Yes    Comment: occasional : 1x/week   Drug use: No     Colonoscopy:  PAP:  Bone density:  Lipid panel:  Allergies  Allergen Reactions   Penicillins Hives     Current Outpatient Medications  Medication Sig Dispense Refill   aspirin EC 81 MG tablet Take 81 mg by mouth in the morning.     buPROPion (WELLBUTRIN XL) 150 MG 24 hr tablet TAKE 1 TABLET BY MOUTH IN THE MORNING. FOR MOOD AND FATIGUE. 90 tablet 1   calcium carbonate (OS-CAL) 600 MG TABS Take 600 mg by mouth every evening.     Coenzyme Q10 (CO Q 10 PO) Take 1 capsule by mouth in the morning.     Fluocinolone Acetonide 0.01 % OIL Apply twice daily to ears as needed for rash/itching 20 mL 2   ibuprofen (ADVIL,MOTRIN) 200 MG tablet Take 400 mg by mouth every 8 (eight) hours as needed (pain.).     ketoconazole (NIZORAL) 2 % shampoo 2-3 times per week lather on scalp and ears, leave on 8-10 minutes, rinse well (Patient taking differently: Apply 1 Application topically once a week. Once per week lather on scalp and ears, leave on 8-10 minutes, rinse well) 120 mL 5   lidocaine-prilocaine (EMLA) cream Apply 1 Application topically as needed.     MAGNESIUM PO Take 1 tablet by mouth every evening.     OMEGA-3 FATTY ACIDS PO Take 1 g by mouth every evening.     simvastatin (ZOCOR) 40 MG tablet TAKE 1 TABLET BY MOUTH EVERY DAY 90 tablet 3   VITAMIN D PO Take 1,000 Units by mouth in the morning.     phenazopyridine (PYRIDIUM) 95 MG tablet Take 95 mg by mouth 3 (three) times daily as needed for pain. (Patient not taking: Reported on 03/12/2023)     No current facility-administered medications for this visit.    OBJECTIVE: Vitals:   05/07/23 0856  BP: (!) 146/90  Pulse: 98  Resp: 16  Temp: (!) 96.7 F (35.9 C)  SpO2: 100%     Body mass index is 27.19 kg/m.    ECOG FS:0 - Asymptomatic  General: Well-developed, well-nourished, no acute distress. Eyes: Pink conjunctiva, anicteric sclera. HEENT: Normocephalic, moist mucous membranes. Lungs: No audible wheezing or coughing. Heart: Regular rate and rhythm. Abdomen: Soft, nontender, no obvious distention. Musculoskeletal: No edema, cyanosis, or  clubbing. Neuro: Alert, answering all questions appropriately. Cranial nerves grossly intact. Skin: No rashes or petechiae noted. Psych: Normal affect.  LAB RESULTS:  Lab Results  Component Value Date   NA 137 05/07/2023   K 3.7 05/07/2023   CL 104 05/07/2023   CO2 22 05/07/2023   GLUCOSE 168 (H) 05/07/2023   BUN 17 05/07/2023   CREATININE 1.06 (H) 05/07/2023   CALCIUM 8.9 05/07/2023   PROT 7.2 05/07/2023   ALBUMIN 3.8 05/07/2023   AST 31 05/07/2023   ALT 19 05/07/2023   ALKPHOS 84 05/07/2023   BILITOT 0.5 05/07/2023   GFRNONAA 58 (L) 05/07/2023   GFRAA >60 01/04/2018    Lab Results  Component Value Date   WBC 4.5 05/07/2023   NEUTROABS 2.8 05/07/2023   HGB 12.5 05/07/2023   HCT 38.0  05/07/2023   MCV 87.6 05/07/2023   PLT 254 05/07/2023     STUDIES: No results found.   ASSESSMENT: Stage IIIb adenocarcinoma of the lung.  PLAN:    Stage IIIb adenocarcinoma of the lung: Biopsy from bronchoscopy on June 07, 2022 confirming the diagnosis.  PET scan results from May 22, 2022 reviewed independently confirming stage of disease.  The bilateral hypermetabolic cervical lymph nodes are suspicious, but will treat patient as a stage IIIb.  MRI of the brain on Jun 19, 2022 did not reveal any metastatic disease.  Patient completed weekly carboplatin and Taxol on July 31, 2022.  Patient now receiving maintenance durvalumab every 2 weeks for 1 year.  She initiated maintenance treatment on August 14, 2022.  PET scan results from November 20, 2022 reviewed independently with interval improvement of disease along with extensive posttreatment changes.  Proceed with treatment today.  Return to clinic in 2 weeks for durvalumab only and then in 4 weeks for further evaluation and continuation of treatment.  Will reimage with CT scan prior to her the 4-week treatment and evaluation.  Peripheral neuropathy: Chronic and unchanged.   Insomnia: Patient does not complain of this today.    Adjustment/anxiety: Continue Wellbutrin as prescribed. UTI: Patient was given a prescription for Bactrim today.  I spent a total of 30 minutes reviewing chart data, face-to-face evaluation with the patient, counseling and coordination of care as detailed above.   Patient expressed understanding and was in agreement with this plan. She also understands that She can call clinic at any time with any questions, concerns, or complaints.    Cancer Staging  Adenocarcinoma of right lung [facility]) Staging form: Lung, AJCC 8th Edition - Clinical stage from 06/12/2022: Stage IIIB (cT2b, cN3, cM0) - Signed by Jeralyn Ruths, MD on 06/12/2022 Stage prefix: Initial diagnosis   Jeralyn Ruths, MD   05/07/2023 9:07 AM

## 2023-05-08 LAB — T4: T4, Total: 8.1 ug/dL (ref 4.5–12.0)

## 2023-05-09 ENCOUNTER — Other Ambulatory Visit: Payer: Self-pay | Admitting: Oncology

## 2023-05-09 ENCOUNTER — Telehealth: Payer: Self-pay | Admitting: *Deleted

## 2023-05-09 ENCOUNTER — Other Ambulatory Visit: Payer: Self-pay | Admitting: *Deleted

## 2023-05-09 DIAGNOSIS — C3491 Malignant neoplasm of unspecified part of right bronchus or lung: Secondary | ICD-10-CM

## 2023-05-09 LAB — URINE CULTURE: Culture: >=100000 — AB

## 2023-05-09 NOTE — Addendum Note (Signed)
 Addended by: Luz Lex on: 05/09/2023 10:00 AM   Modules accepted: Orders

## 2023-05-09 NOTE — Telephone Encounter (Signed)
 The pt. Needs ct chest with contrast and I put the order in and then the GI CT fpr 05/12/2023 at 10 am should be cancelled . Please cancel the wrong ct and do the lung CT at [facility] and then call and tell the patient the date and time for her lung ct. Pt is aware of the issues.

## 2023-05-12 ENCOUNTER — Encounter: Payer: Self-pay | Admitting: Oncology

## 2023-05-12 ENCOUNTER — Ambulatory Visit
Admission: RE | Admit: 2023-05-12 | Discharge: 2023-05-12 | Disposition: A | Discharge status: Home / Self Care | Source: Ambulatory Visit | Attending: Oncology | Admitting: Oncology

## 2023-05-12 DIAGNOSIS — J90 Pleural effusion, not elsewhere classified: Secondary | ICD-10-CM | POA: Diagnosis not present

## 2023-05-12 DIAGNOSIS — C3411 Malignant neoplasm of upper lobe, right bronchus or lung: Secondary | ICD-10-CM | POA: Diagnosis not present

## 2023-05-12 DIAGNOSIS — C3491 Malignant neoplasm of unspecified part of right bronchus or lung: Secondary | ICD-10-CM

## 2023-05-12 MED ORDER — HEPARIN SOD (PORK) LOCK FLUSH 100 UNIT/ML IV SOLN
500.0000 [IU] | Freq: Once | INTRAVENOUS | Status: AC
  Administered 2023-05-12: 500 [IU] via INTRAVENOUS

## 2023-05-12 MED ORDER — SODIUM CHLORIDE 0.9% FLUSH
10.0000 mL | INTRAVENOUS | Status: DC | PRN
  Administered 2023-05-12: 10 mL via INTRAVENOUS

## 2023-05-12 MED ORDER — IOPAMIDOL (ISOVUE-370) INJECTION 76%
75.0000 mL | Freq: Once | INTRAVENOUS | Status: AC | PRN
Start: 2023-05-12 — End: 2023-05-12
  Administered 2023-05-12: 75 mL via INTRAVENOUS

## 2023-05-21 ENCOUNTER — Inpatient Hospital Stay: Attending: Oncology

## 2023-05-21 VITALS — BP 132/81 | HR 79 | Temp 97.3°F | Resp 19 | Ht 64.0 in | Wt 157.6 lb

## 2023-05-21 DIAGNOSIS — G629 Polyneuropathy, unspecified: Secondary | ICD-10-CM | POA: Insufficient documentation

## 2023-05-21 DIAGNOSIS — C3491 Malignant neoplasm of unspecified part of right bronchus or lung: Secondary | ICD-10-CM

## 2023-05-21 DIAGNOSIS — Z79620 Long term (current) use of immunosuppressive biologic: Secondary | ICD-10-CM | POA: Diagnosis not present

## 2023-05-21 DIAGNOSIS — G4700 Insomnia, unspecified: Secondary | ICD-10-CM | POA: Diagnosis not present

## 2023-05-21 DIAGNOSIS — Z5112 Encounter for antineoplastic immunotherapy: Secondary | ICD-10-CM | POA: Diagnosis not present

## 2023-05-21 DIAGNOSIS — C3411 Malignant neoplasm of upper lobe, right bronchus or lung: Secondary | ICD-10-CM | POA: Insufficient documentation

## 2023-05-21 MED ORDER — SODIUM CHLORIDE 0.9 % IV SOLN
Freq: Once | INTRAVENOUS | Status: AC
  Filled 2023-05-21: qty 250

## 2023-05-21 MED ORDER — DURVALUMAB 500 MG/10ML IV SOLN
10.0000 mg/kg | Freq: Once | INTRAVENOUS | Status: AC
  Administered 2023-05-21: 740 mg via INTRAVENOUS
  Filled 2023-05-21: qty 4.8

## 2023-05-21 MED ORDER — HEPARIN SOD (PORK) LOCK FLUSH 100 UNIT/ML IV SOLN
500.0000 [IU] | Freq: Once | INTRAVENOUS | Status: AC | PRN
  Administered 2023-05-21: 500 [IU]
  Filled 2023-05-21: qty 5

## 2023-05-21 NOTE — Patient Instructions (Signed)
 [facility] - A DEPT OF [facility]  Discharge Instructions: Thank you for choosing [facility] to provide your oncology and hematology care.  If you have a lab appointment with the Cancer Center, please go directly to the Cancer Center and check in at the registration area.  Wear comfortable clothing and clothing appropriate for easy access to any Portacath or PICC line.   We strive to give you quality time with your provider. You may need to reschedule your appointment if you arrive late (15 or more minutes).  Arriving late affects you and other patients whose appointments are after yours.  Also, if you miss three or more appointments without notifying the office, you may be dismissed from the clinic at the provider's discretion.      For prescription refill requests, have your pharmacy contact our office and allow 72 hours for refills to be completed.    Today you received the following chemotherapy and/or immunotherapy agents IMFINZI      To help prevent nausea and vomiting after your treatment, we encourage you to take your nausea medication as directed.  BELOW ARE SYMPTOMS THAT SHOULD BE REPORTED IMMEDIATELY: *FEVER GREATER THAN 100.4 F (38 C) OR HIGHER *CHILLS OR SWEATING *NAUSEA AND VOMITING THAT IS NOT CONTROLLED WITH YOUR NAUSEA MEDICATION *UNUSUAL SHORTNESS OF BREATH *UNUSUAL BRUISING OR BLEEDING *URINARY PROBLEMS (pain or burning when urinating, or frequent urination) *BOWEL PROBLEMS (unusual diarrhea, constipation, pain near the anus) TENDERNESS IN MOUTH AND THROAT WITH OR WITHOUT PRESENCE OF ULCERS (sore throat, sores in mouth, or a toothache) UNUSUAL RASH, SWELLING OR PAIN  UNUSUAL VAGINAL DISCHARGE OR ITCHING   Items with * indicate a potential emergency and should be followed up as soon as possible or go to the Emergency Department if any problems should occur.  Please show the CHEMOTHERAPY ALERT CARD or IMMUNOTHERAPY  ALERT CARD at check-in to the Emergency Department and triage nurse.  Should you have questions after your visit or need to cancel or reschedule your appointment, please contact [facility] - A DEPT OF [facility]  [phone] and follow the prompts.  Office hours are 8:00 a.m. to 4:30 p.m. Monday - Friday. Please note that voicemails left after 4:00 p.m. may not be returned until the following business day.  We are closed weekends and major holidays. You have access to a nurse at all times for urgent questions. Please call the main number to the clinic [phone] and follow the prompts.  For any non-urgent questions, you may also contact your provider using MyChart. We now offer e-Visits for anyone 30 and older to request care online for non-urgent symptoms. For details visit [email or web address].   Also download the MyChart app! Go to the app store, search "MyChart", open the app, select [facility], and log in with your MyChart username and password.  Durvalumab Injection What is this medication? DURVALUMAB (dur VAL ue mab) treats some types of cancer. It works by helping your immune system slow or stop the spread of cancer cells. It is a monoclonal antibody. This medicine may be used for other purposes; ask your health care provider or pharmacist if you have questions. COMMON BRAND NAME(S): IMFINZI What should I tell my care team before I take this medication? They need to know if you have any of these conditions: Allogeneic stem cell transplant (uses someone else's stem cells) Autoimmune diseases, such as Crohn disease, ulcerative  colitis, lupus History of chest radiation Nervous system problems, such as Guillain-Barre syndrome, myasthenia gravis Organ transplant An unusual or allergic reaction to durvalumab, other medications, foods, dyes, or preservatives Pregnant or trying to get pregnant Breast-feeding How should I use this medication? This  medication is infused into a vein. It is given by your care team in a hospital or clinic setting. A special MedGuide will be given to you before each treatment. Be sure to read this information carefully each time. Talk to your care team about the use of this medication in children. Special care may be needed. Overdosage: If you think you have taken too much of this medicine contact a poison control center or emergency room at once. NOTE: This medicine is only for you. Do not share this medicine with others. What if I miss a dose? Keep appointments for follow-up doses. It is important not to miss your dose. Call your care team if you are unable to keep an appointment. What may interact with this medication? Interactions have not been studied. This list may not describe all possible interactions. Give your health care provider a list of all the medicines, herbs, non-prescription drugs, or dietary supplements you use. Also tell them if you smoke, drink alcohol, or use illegal drugs. Some items may interact with your medicine. What should I watch for while using this medication? Your condition will be monitored carefully while you are receiving this medication. You may need blood work while taking this medication. This medication may cause serious skin reactions. They can happen weeks to months after starting the medication. Contact your care team right away if you notice fevers or flu-like symptoms with a rash. The rash may be red or purple and then turn into blisters or peeling of the skin. You may also notice a red rash with swelling of the face, lips, or lymph nodes in your neck or under your arms. Tell your care team right away if you have any change in your eyesight. Talk to your care team if you may be pregnant. Serious birth defects can occur if you take this medication during pregnancy and for 3 months after the last dose. You will need a negative pregnancy test before starting this medication.  Contraception is recommended while taking this medication and for 3 months after the last dose. Your care team can help you find the option that works for you. Do not breastfeed while taking this medication and for 3 months after the last dose. What side effects may I notice from receiving this medication? Side effects that you should report to your care team as soon as possible: Allergic reactions--skin rash, itching, hives, swelling of the face, lips, tongue, or throat Dry cough, shortness of breath or trouble breathing Eye pain, redness, irritation, or discharge with blurry or decreased vision Heart muscle inflammation--unusual weakness or fatigue, shortness of breath, chest pain, fast or irregular heartbeat, dizziness, swelling of the ankles, feet, or hands Hormone gland problems--headache, sensitivity to light, unusual weakness or fatigue, dizziness, fast or irregular heartbeat, increased sensitivity to cold or heat, excessive sweating, constipation, hair loss, increased thirst or amount of urine, tremors or shaking, irritability Infusion reactions--chest pain, shortness of breath or trouble breathing, feeling faint or lightheaded Kidney injury (glomerulonephritis)--decrease in the amount of urine, red or dark brown urine, foamy or bubbly urine, swelling of the ankles, hands, or feet Liver injury--right upper belly pain, loss of appetite, nausea, light-colored stool, dark yellow or brown urine, yellowing skin or eyes,  unusual weakness or fatigue Pain, tingling, or numbness in the hands or feet, muscle weakness, change in vision, confusion or trouble speaking, loss of balance or coordination, trouble walking, seizures Rash, fever, and swollen lymph nodes Redness, blistering, peeling, or loosening of the skin, including inside the mouth Sudden or severe stomach pain, bloody diarrhea, fever, nausea, vomiting Side effects that usually do not require medical attention (report these to your care team  if they continue or are bothersome): Bone, joint, or muscle pain Diarrhea Fatigue Loss of appetite Nausea Skin rash This list may not describe all possible side effects. Call your doctor for medical advice about side effects. You may report side effects to FDA at 1-800-FDA-1088. Where should I keep my medication? This medication is given in a hospital or clinic. It will not be stored at home. NOTE: This sheet is a summary. It may not cover all possible information. If you have questions about this medicine, talk to your doctor, pharmacist, or health care provider.  2024 Elsevier/Gold Standard (2021-06-12 00:00:00)

## 2023-06-04 ENCOUNTER — Inpatient Hospital Stay: Admitting: Oncology

## 2023-06-04 ENCOUNTER — Inpatient Hospital Stay

## 2023-06-04 ENCOUNTER — Encounter: Payer: Self-pay | Admitting: Oncology

## 2023-06-04 VITALS — BP 126/75 | HR 84 | Temp 96.0°F | Resp 18

## 2023-06-04 VITALS — BP 136/87 | HR 93 | Temp 97.0°F | Resp 16 | Wt 158.0 lb

## 2023-06-04 DIAGNOSIS — G629 Polyneuropathy, unspecified: Secondary | ICD-10-CM | POA: Diagnosis not present

## 2023-06-04 DIAGNOSIS — C3491 Malignant neoplasm of unspecified part of right bronchus or lung: Secondary | ICD-10-CM

## 2023-06-04 DIAGNOSIS — G4700 Insomnia, unspecified: Secondary | ICD-10-CM | POA: Diagnosis not present

## 2023-06-04 DIAGNOSIS — C3411 Malignant neoplasm of upper lobe, right bronchus or lung: Secondary | ICD-10-CM | POA: Diagnosis not present

## 2023-06-04 DIAGNOSIS — Z5112 Encounter for antineoplastic immunotherapy: Secondary | ICD-10-CM | POA: Diagnosis not present

## 2023-06-04 DIAGNOSIS — Z79620 Long term (current) use of immunosuppressive biologic: Secondary | ICD-10-CM | POA: Diagnosis not present

## 2023-06-04 LAB — CMP (CANCER CENTER ONLY)
ALT: 23 U/L (ref 0–44)
AST: 29 U/L (ref 15–41)
Albumin: 3.8 g/dL (ref 3.5–5.0)
Alkaline Phosphatase: 75 U/L (ref 38–126)
Anion gap: 8 (ref 5–15)
BUN: 15 mg/dL (ref 8–23)
CO2: 23 mmol/L (ref 22–32)
Calcium: 8.9 mg/dL (ref 8.9–10.3)
Chloride: 103 mmol/L (ref 98–111)
Creatinine: 0.77 mg/dL (ref 0.44–1.00)
GFR, Estimated: >60 mL/min (ref >60)
Glucose, Bld: 122 mg/dL — ABNORMAL HIGH (ref 70–99)
Potassium: 4 mmol/L (ref 3.5–5.1)
Sodium: 134 mmol/L — ABNORMAL LOW (ref 135–145)
Total Bilirubin: 0.8 mg/dL (ref 0.0–1.2)
Total Protein: 7.4 g/dL (ref 6.5–8.1)

## 2023-06-04 LAB — CBC WITH DIFFERENTIAL (CANCER CENTER ONLY)
Abs Immature Granulocytes: 0.02 10*3/uL (ref 0.00–0.07)
Basophils Absolute: 0 10*3/uL (ref 0.0–0.1)
Basophils Relative: 0 %
Eosinophils Absolute: 0.1 10*3/uL (ref 0.0–0.5)
Eosinophils Relative: 1 %
HCT: 36.6 % (ref 36.0–46.0)
Hemoglobin: 12.1 g/dL (ref 12.0–15.0)
Immature Granulocytes: 0 %
Lymphocytes Relative: 16 %
Lymphs Abs: 0.8 10*3/uL (ref 0.7–4.0)
MCH: 29.2 pg (ref 26.0–34.0)
MCHC: 33.1 g/dL (ref 30.0–36.0)
MCV: 88.2 fL (ref 80.0–100.0)
Monocytes Absolute: 0.5 10*3/uL (ref 0.1–1.0)
Monocytes Relative: 9 %
Neutro Abs: 3.8 10*3/uL (ref 1.7–7.7)
Neutrophils Relative %: 74 %
Platelet Count: 224 10*3/uL (ref 150–400)
RBC: 4.15 MIL/uL (ref 3.87–5.11)
RDW: 12.8 % (ref 11.5–15.5)
WBC Count: 5.2 10*3/uL (ref 4.0–10.5)
nRBC: 0 % (ref 0.0–0.2)

## 2023-06-04 LAB — TSH: TSH: 2.789 u[IU]/mL (ref 0.350–4.500)

## 2023-06-04 MED ORDER — SODIUM CHLORIDE 0.9 % IV SOLN
Freq: Once | INTRAVENOUS | Status: AC
  Filled 2023-06-04: qty 250

## 2023-06-04 MED ORDER — HEPARIN SOD (PORK) LOCK FLUSH 100 UNIT/ML IV SOLN
500.0000 [IU] | Freq: Once | INTRAVENOUS | Status: AC | PRN
Start: 2023-06-04 — End: 2023-06-04
  Administered 2023-06-04: 500 [IU]
  Filled 2023-06-04: qty 5

## 2023-06-04 MED ORDER — SODIUM CHLORIDE 0.9 % IV SOLN
10.0000 mg/kg | Freq: Once | INTRAVENOUS | Status: AC
  Administered 2023-06-04: 740 mg via INTRAVENOUS
  Filled 2023-06-04: qty 4.8

## 2023-06-04 NOTE — Patient Instructions (Signed)
 [facility]  Discharge Instructions: Thank you for choosing [facility] to provide your oncology and hematology care.  If you have a lab appointment with the Cancer Center, please go directly to the Cancer Center and check in at the registration area.  Wear comfortable clothing and clothing appropriate for easy access to any Portacath or PICC line.   We strive to give you quality time with your provider. You may need to reschedule your appointment if you arrive late (15 or more minutes).  Arriving late affects you and other patients whose appointments are after yours.  Also, if you miss three or more appointments without notifying the office, you may be dismissed from the clinic at the provider's discretion.      For prescription refill requests, have your pharmacy contact our office and allow 72 hours for refills to be completed.    Today you received the following chemotherapy and/or immunotherapy agents imfinzi      To help prevent nausea and vomiting after your treatment, we encourage you to take your nausea medication as directed.  BELOW ARE SYMPTOMS THAT SHOULD BE REPORTED IMMEDIATELY: *FEVER GREATER THAN 100.4 F (38 C) OR HIGHER *CHILLS OR SWEATING *NAUSEA AND VOMITING THAT IS NOT CONTROLLED WITH YOUR NAUSEA MEDICATION *UNUSUAL SHORTNESS OF BREATH *UNUSUAL BRUISING OR BLEEDING *URINARY PROBLEMS (pain or burning when urinating, or frequent urination) *BOWEL PROBLEMS (unusual diarrhea, constipation, pain near the anus) TENDERNESS IN MOUTH AND THROAT WITH OR WITHOUT PRESENCE OF ULCERS (sore throat, sores in mouth, or a toothache) UNUSUAL RASH, SWELLING OR PAIN  UNUSUAL VAGINAL DISCHARGE OR ITCHING   Items with * indicate a potential emergency and should be followed up as soon as possible or go to the Emergency Department if any problems should occur.  Please show the CHEMOTHERAPY ALERT CARD or IMMUNOTHERAPY  ALERT CARD at check-in to the Emergency Department and triage nurse.  Should you have questions after your visit or need to cancel or reschedule your appointment, please contact [facility] - A DEPT OF [facility]  [phone] and follow the prompts.  Office hours are 8:00 a.m. to 4:30 p.m. Monday - Friday. Please note that voicemails left after 4:00 p.m. may not be returned until the following business day.  We are closed weekends and major holidays. You have access to a nurse at all times for urgent questions. Please call the main number to the clinic [phone] and follow the prompts.  For any non-urgent questions, you may also contact your provider using MyChart. We now offer e-Visits for anyone 4 and older to request care online for non-urgent symptoms. For details visit [email or web address].   Also download the MyChart app! Go to the app store, search "MyChart", open the app, select [place], and log in with your MyChart username and password.

## 2023-06-04 NOTE — Progress Notes (Unsigned)
 [facility]  Telephone:([phone] [phone]  ID: Luke Salaam OB: Apr 22, 1955  MR#: [id number]  AOZ#:[id number]  Patient Care Team: Judithann Novas, MD as PCP - General Drake Gens, RN as Oncology Nurse Navigator Adrian Alba, Deadra Everts, MD as Consulting Physician (Oncology) Pa, [name]  CHIEF COMPLAINT: Stage IIIb adenocarcinoma of the lung.  INTERVAL HISTORY: Patient returns to clinic today for further evaluation, discussion of her imaging results, and continuation of maintenance durvalumab .  She currently feels well and is asymptomatic.  She continues to tolerate her treatments without significant side effects. Her peripheral neuropathy is unchanged.  She has no other neurologic complaints.  She denies any recent fevers or illnesses.  She has a good appetite and denies weight loss.  She has no chest pain, shortness of breath, cough, or hemoptysis.  She denies any nausea, vomiting, constipation, or diarrhea.  She has no urinary complaints.  Patient offers no further specific complaints today.  REVIEW OF SYSTEMS:   Review of Systems  Constitutional: Negative.  Negative for fever, malaise/fatigue and weight loss.  Respiratory: Negative.  Negative for cough, hemoptysis and shortness of breath.   Cardiovascular: Negative.  Negative for chest pain and leg swelling.  Gastrointestinal: Negative.  Negative for abdominal pain.  Genitourinary: Negative.  Negative for dysuria, frequency and urgency.  Musculoskeletal: Negative.  Negative for back pain and joint pain.  Skin: Negative.  Negative for rash.  Neurological:  Positive for tingling and sensory change. Negative for dizziness, focal weakness, weakness and headaches.  Psychiatric/Behavioral: Negative.  The patient is not nervous/anxious and does not have insomnia.     As per HPI. Otherwise, a complete review of systems is negative.  PAST MEDICAL HISTORY: Past Medical History:  Diagnosis Date    Complication of anesthesia    Hyperlipidemia    Lung cancer, main bronchus, right (HCC)    Personal history of chemotherapy    Personal history of radiation therapy    PONV (postoperative nausea and vomiting)    TIA (transient ischemic attack)     PAST SURGICAL HISTORY: Past Surgical History:  Procedure Laterality Date   BREAST CYST ASPIRATION Right 07/20/2012   FNA benign   BREAST CYST ASPIRATION Right    BREAST SURGERY Right 07-20-12   FNA benign   BRONCHIAL NEEDLE ASPIRATION BIOPSY  06/07/2022   Procedure: BRONCHIAL NEEDLE ASPIRATION BIOPSIES;  Surgeon: Vergia Glasgow, MD;  Location: [facility];  Service: Pulmonary;;   CESAREAN SECTION     CHOLECYSTECTOMY     COLONOSCOPY WITH PROPOFOL  N/A 03/21/2021   Procedure: COLONOSCOPY WITH PROPOFOL ;  Surgeon: Selena Daily, MD;  Location: [facility] ENDOSCOPY;  Service: Gastroenterology;  Laterality: N/A;   IR IMAGING GUIDED PORT INSERTION  06/21/2022   OVARY SURGERY     VIDEO BRONCHOSCOPY WITH ENDOBRONCHIAL ULTRASOUND N/A 06/07/2022   Procedure: VIDEO BRONCHOSCOPY WITH ENDOBRONCHIAL ULTRASOUND;  Surgeon: Vergia Glasgow, MD;  Location: [facility];  Service: Pulmonary;  Laterality: N/A;    FAMILY HISTORY: Family History  Problem Relation Age of Onset   Breast cancer Mother 84   Cancer Mother        breast   Cancer Father        bone cancer    ADVANCED DIRECTIVES (Y/N):  N  HEALTH MAINTENANCE: Social History   Tobacco Use   Smoking status: Former    Current packs/day: 0.00    Average packs/day: 0.6 packs/day for 43.0 years (26.7 ttl pk-yrs)    Types: Cigarettes  Start date: 32    Quit date: 2016    Years since quitting: 9.3   Smokeless tobacco: Never   Tobacco comments:    quit x 1 month 11/16  Vaping Use   Vaping status: Never Used  Substance Use Topics   Alcohol use: Yes    Comment: occasional : 1x/week   Drug use: No     Colonoscopy:  PAP:  Bone density:  Lipid panel:  Allergies  Allergen Reactions    Penicillins Hives    Current Outpatient Medications  Medication Sig Dispense Refill   aspirin EC 81 MG tablet Take 81 mg by mouth in the morning.     buPROPion  (WELLBUTRIN  XL) 150 MG 24 hr tablet TAKE 1 TABLET BY MOUTH IN THE MORNING. FOR MOOD AND FATIGUE. 90 tablet 1   calcium carbonate (OS-CAL) 600 MG TABS Take 600 mg by mouth every evening.     Coenzyme Q10 (CO Q 10 PO) Take 1 capsule by mouth in the morning.     Fluocinolone  Acetonide 0.01 % OIL Apply twice daily to ears as needed for rash/itching 20 mL 2   ibuprofen (ADVIL,MOTRIN) 200 MG tablet Take 400 mg by mouth every 8 (eight) hours as needed (pain.).     ketoconazole  (NIZORAL ) 2 % shampoo 2-3 times per week lather on scalp and ears, leave on 8-10 minutes, rinse well (Patient taking differently: Apply 1 Application topically once a week. Once per week lather on scalp and ears, leave on 8-10 minutes, rinse well) 120 mL 5   lidocaine -prilocaine  (EMLA ) cream Apply 1 Application topically as needed.     MAGNESIUM PO Take 1 tablet by mouth every evening.     OMEGA-3 FATTY ACIDS PO Take 1 g by mouth every evening.     simvastatin  (ZOCOR ) 40 MG tablet TAKE 1 TABLET BY MOUTH EVERY DAY 90 tablet 3   sulfamethoxazole -trimethoprim  (BACTRIM  DS) 800-160 MG tablet Take 1 tablet by mouth 2 (two) times daily. 14 tablet 0   VITAMIN D  PO Take 1,000 Units by mouth in the morning.     phenazopyridine (PYRIDIUM) 95 MG tablet Take 95 mg by mouth 3 (three) times daily as needed for pain. (Patient not taking: Reported on 06/04/2023)     No current facility-administered medications for this visit.    OBJECTIVE: Vitals:   06/04/23 0853  BP: 136/87  Pulse: 93  Resp: 16  Temp: (!) 97 F (36.1 C)  SpO2: 100%     Body mass index is 27.12 kg/m.    ECOG FS:0 - Asymptomatic  General: Well-developed, well-nourished, no acute distress. Eyes: Pink conjunctiva, anicteric sclera. HEENT: Normocephalic, moist mucous membranes. Lungs: No audible wheezing or  coughing. Heart: Regular rate and rhythm. Abdomen: Soft, nontender, no obvious distention. Musculoskeletal: No edema, cyanosis, or clubbing. Neuro: Alert, answering all questions appropriately. Cranial nerves grossly intact. Skin: No rashes or petechiae noted. Psych: Normal affect.  LAB RESULTS:  Lab Results  Component Value Date   NA 134 (L) 06/04/2023   K 4.0 06/04/2023   CL 103 06/04/2023   CO2 23 06/04/2023   GLUCOSE 122 (H) 06/04/2023   BUN 15 06/04/2023   CREATININE 0.77 06/04/2023   CALCIUM 8.9 06/04/2023   PROT 7.4 06/04/2023   ALBUMIN 3.8 06/04/2023   AST 29 06/04/2023   ALT 23 06/04/2023   ALKPHOS 75 06/04/2023   BILITOT 0.8 06/04/2023   GFRNONAA >60 06/04/2023   GFRAA >60 01/04/2018    Lab Results  Component Value Date  WBC 5.2 06/04/2023   NEUTROABS 3.8 06/04/2023   HGB 12.1 06/04/2023   HCT 36.6 06/04/2023   MCV 88.2 06/04/2023   PLT 224 06/04/2023     STUDIES: CT CHEST W CONTRAST Result Date: 05/25/2023 CLINICAL DATA:  Restaging lung cancer. History of right upper lobe adenocarcinoma. EXAM: CT CHEST WITH CONTRAST TECHNIQUE: Multidetector CT imaging of the chest was performed during intravenous contrast administration. RADIATION DOSE REDUCTION: This exam was performed according to the departmental dose-optimization program which includes automated exposure control, adjustment of the mA and/or kV according to patient size and/or use of iterative reconstruction technique. CONTRAST:  75mL ISOVUE -370 IOPAMIDOL  (ISOVUE -370) INJECTION 76% COMPARISON:  Numerous prior imaging studies. The most recent PET-CT is 10/21/2022 FINDINGS: Cardiovascular: The heart is normal in size. No pericardial effusion. Stable tortuosity and calcification of the thoracic aorta. No dissection. The pulmonary arteries are unremarkable. Mediastinum/Nodes: No discrete mediastinal or hilar mass or adenopathy. The esophagus is grossly normal. Lungs/Pleura: Further contraction/fibrosis of the  treated right upper lobe lung lesions with radiation fibrosis, bronchiectasis and volume loss with shift of the heart and mediastinum to the right. The more anterior right upper lobe lesion measures approximately 2.8 x 2.3 cm and previously measured 2.9 x 2.9 cm. The lower more posterior lesion measures approximately 2.3 x 1.4 cm and previously measured 2.9 x 2.9 cm. Findings suggest a good response to treatment. No new pulmonary lesions or nodules. No pleural effusions or pleural nodules. There is a persistent right pleural effusion with fluid tracking in the major fissure. Upper Abdomen: No significant upper abdominal findings. L hepatic or adrenal gland lesions. No upper abdominal adenopathy. Musculoskeletal: No breast masses, supraclavicular or axillary adenopathy. The left-sided Port-A-Cath is stable. No worrisome bone lesions. Stable sclerotic changes involving the upper thoracic vertebral bodies. Moderate osteoporosis. IMPRESSION: 1. Further contraction/fibrosis of the treated right upper lobe lung lesions with radiation fibrosis, bronchiectasis and volume loss with shift of the heart and mediastinum to the right. The pulmonary lesions are smaller. Findings suggest a good response to treatment. 2. No new pulmonary lesions or nodules. 3. No mediastinal or hilar mass or adenopathy. 4. Persistent right pleural effusion with fluid tracking in the major fissure. 5. Stable sclerotic changes involving the upper thoracic vertebral bodies. 6. Aortic atherosclerosis. Electronically Signed   By: Marrian Siva M.D.   On: 05/25/2023 17:32     ASSESSMENT: Stage IIIb adenocarcinoma of the lung.  PLAN:    Stage IIIb adenocarcinoma of the lung: Biopsy from bronchoscopy on June 07, 2022 confirming the diagnosis.  PET scan results from May 22, 2022 reviewed independently confirming stage of disease.  The bilateral hypermetabolic cervical lymph nodes are suspicious, but will treat patient as a stage IIIb.  MRI of the  brain on Jun 19, 2022 did not reveal any metastatic disease.  Patient completed weekly carboplatin  and Taxol  on July 31, 2022.  Patient now receiving maintenance durvalumab  every 2 weeks for 1 year.  She initiated maintenance treatment on August 14, 2022.  Her most recent imaging results from CT scan on May 25, 2023 reviewed independently and reported as above with no obvious evidence of recurrent or progressive disease.  Proceed with treat today.  Return to clinic in 2 weeks for treatment only and then in 4 weeks for further evaluation and continuation of maintenance durvalumab .   Peripheral neuropathy: Chronic and unchanged. Insomnia: Patient does not complain of this today.   Adjustment/anxiety: Continue Wellbutrin  as prescribed. UTI: Resolved.  I spent a total  of 30 minutes reviewing chart data, face-to-face evaluation with the patient, counseling and coordination of care as detailed above.   Patient expressed understanding and was in agreement with this plan. She also understands that She can call clinic at any time with any questions, concerns, or complaints.    Cancer Staging  Adenocarcinoma of right lung [facility]) Staging form: Lung, AJCC 8th Edition - Clinical stage from 06/12/2022: Stage IIIB (cT2b, cN3, cM0) - Signed by Shellie Dials, MD on 06/12/2022 Stage prefix: Initial diagnosis   Shellie Dials, MD   06/06/2023 3:27 PM

## 2023-06-05 LAB — T4: T4, Total: 8.1 ug/dL (ref 4.5–12.0)

## 2023-06-06 ENCOUNTER — Encounter: Payer: Self-pay | Admitting: Oncology

## 2023-06-12 ENCOUNTER — Encounter: Payer: Self-pay | Admitting: Radiation Oncology

## 2023-06-12 ENCOUNTER — Ambulatory Visit
Admission: RE | Admit: 2023-06-12 | Discharge: 2023-06-12 | Disposition: A | Discharge status: Home / Self Care | Payer: Medicare Other | Source: Ambulatory Visit | Attending: Radiation Oncology | Admitting: Radiation Oncology

## 2023-06-12 VITALS — BP 111/92 | HR 100 | Temp 97.0°F | Resp 16 | Ht 64.0 in | Wt 156.4 lb

## 2023-06-12 DIAGNOSIS — Z9221 Personal history of antineoplastic chemotherapy: Secondary | ICD-10-CM | POA: Insufficient documentation

## 2023-06-12 DIAGNOSIS — C3491 Malignant neoplasm of unspecified part of right bronchus or lung: Secondary | ICD-10-CM

## 2023-06-12 DIAGNOSIS — C3411 Malignant neoplasm of upper lobe, right bronchus or lung: Secondary | ICD-10-CM | POA: Insufficient documentation

## 2023-06-12 DIAGNOSIS — Z923 Personal history of irradiation: Secondary | ICD-10-CM | POA: Insufficient documentation

## 2023-06-12 DIAGNOSIS — Z87891 Personal history of nicotine dependence: Secondary | ICD-10-CM | POA: Diagnosis not present

## 2023-06-12 DIAGNOSIS — J90 Pleural effusion, not elsewhere classified: Secondary | ICD-10-CM | POA: Insufficient documentation

## 2023-06-12 NOTE — Progress Notes (Signed)
 Radiation Oncology Follow up Note  Name: Connie West   Date:   06/12/2023 MRN:  [id number] DOB: 24-Dec-1955    This 68 y.o. female presents to the clinic today for 73-month follow-up status post palliative radiation therapy for initial stage IIIb adenocarcinoma the right lung currently on maintenance Durvalumab .  REFERRING PROVIDER: Judithann Novas, MD  HPI: Patient is a 68 year old female now at 11 months having completed.  Concurrent chemoradiation therapy for stage IIIb adenocarcinoma of the lung.  She completed CarboTaxol with radiation then received currently on maintenance Durvalumab .  She specifically [place] productive cough hemoptysis or chest tightness does occasionally have some mild nonproductive cough.  She had a CT scan in March which I have reviewed showing further contraction of treated right upper lobe with radiation fibrosis suggesting a good response to therapy.  No new pulmonary lesions or adenopathy is identified.  She does have a persistent right pleural effusion with fluid tracking the major fissure.  COMPLICATIONS OF TREATMENT: none  FOLLOW UP COMPLIANCE: keeps appointments   PHYSICAL EXAM:  BP (!) 111/92   Pulse 100   Temp (!) 97 F (36.1 C)   Resp 16   Ht 5\' 4"  (1.626 m)   Wt 156 lb 6.4 oz (70.9 kg)   BMI 26.85 kg/m  Well-developed well-nourished patient in NAD. HEENT reveals PERLA, EOMI, discs not visualized.  Oral cavity is clear. No oral mucosal lesions are identified. Neck is clear without evidence of cervical or supraclavicular adenopathy. Lungs are clear to A&P. Cardiac examination is essentially unremarkable with regular rate and rhythm without murmur rub or thrill. Abdomen is benign with no organomegaly or masses noted. Motor sensory and DTR levels are equal and symmetric in the upper and lower extremities. Cranial nerves II through XII are grossly intact. Proprioception is intact. No peripheral adenopathy or edema is identified. No motor or sensory levels are  noted. Crude visual fields are within normal range.  RADIOLOGY RESULTS: CT scans reviewed compatible with above-stated findings  PLAN: At the present time patient is doing well currently undergoing immunotherapy which she is tolerating well.  I am pleased with her overall progress.  I have reviewed her CT scan showing good response to therapy.  I have asked to see her back in 6 months for follow-up.  Patient knows to call with any concerns.  I would like to take this opportunity to thank you for allowing me to participate in the care of your patient.Glenis Langdon, MD

## 2023-06-18 ENCOUNTER — Inpatient Hospital Stay: Attending: Oncology

## 2023-06-18 VITALS — BP 125/85 | HR 85 | Temp 97.5°F | Resp 16 | Wt 157.2 lb

## 2023-06-18 DIAGNOSIS — F419 Anxiety disorder, unspecified: Secondary | ICD-10-CM | POA: Diagnosis not present

## 2023-06-18 DIAGNOSIS — C3491 Malignant neoplasm of unspecified part of right bronchus or lung: Secondary | ICD-10-CM

## 2023-06-18 DIAGNOSIS — Z79620 Long term (current) use of immunosuppressive biologic: Secondary | ICD-10-CM | POA: Insufficient documentation

## 2023-06-18 DIAGNOSIS — Z79899 Other long term (current) drug therapy: Secondary | ICD-10-CM | POA: Diagnosis not present

## 2023-06-18 DIAGNOSIS — G629 Polyneuropathy, unspecified: Secondary | ICD-10-CM | POA: Diagnosis not present

## 2023-06-18 DIAGNOSIS — C3411 Malignant neoplasm of upper lobe, right bronchus or lung: Secondary | ICD-10-CM | POA: Insufficient documentation

## 2023-06-18 DIAGNOSIS — Z5112 Encounter for antineoplastic immunotherapy: Secondary | ICD-10-CM | POA: Insufficient documentation

## 2023-06-18 MED ORDER — SODIUM CHLORIDE 0.9 % IV SOLN
Freq: Once | INTRAVENOUS | Status: AC
  Filled 2023-06-18: qty 250

## 2023-06-18 MED ORDER — SODIUM CHLORIDE 0.9 % IV SOLN
10.0000 mg/kg | Freq: Once | INTRAVENOUS | Status: AC
  Administered 2023-06-18: 740 mg via INTRAVENOUS
  Filled 2023-06-18: qty 4.8

## 2023-06-18 MED ORDER — HEPARIN SOD (PORK) LOCK FLUSH 100 UNIT/ML IV SOLN
500.0000 [IU] | Freq: Once | INTRAVENOUS | Status: AC | PRN
  Administered 2023-06-18: 500 [IU]
  Filled 2023-06-18: qty 5

## 2023-06-18 NOTE — Patient Instructions (Signed)
 [facility] - A DEPT OF [facility]  Discharge Instructions: Thank you for choosing [facility] to provide your oncology and hematology care.  If you have a lab appointment with the Cancer Center, please go directly to the Cancer Center and check in at the registration area.  Wear comfortable clothing and clothing appropriate for easy access to any Portacath or PICC line.   We strive to give you quality time with your provider. You may need to reschedule your appointment if you arrive late (15 or more minutes).  Arriving late affects you and other patients whose appointments are after yours.  Also, if you miss three or more appointments without notifying the office, you may be dismissed from the clinic at the provider's discretion.      For prescription refill requests, have your pharmacy contact our office and allow 72 hours for refills to be completed.    Today you received the following chemotherapy and/or immunotherapy agents Imfinzi      To help prevent nausea and vomiting after your treatment, we encourage you to take your nausea medication as directed.  BELOW ARE SYMPTOMS THAT SHOULD BE REPORTED IMMEDIATELY: *FEVER GREATER THAN 100.4 F (38 C) OR HIGHER *CHILLS OR SWEATING *NAUSEA AND VOMITING THAT IS NOT CONTROLLED WITH YOUR NAUSEA MEDICATION *UNUSUAL SHORTNESS OF BREATH *UNUSUAL BRUISING OR BLEEDING *URINARY PROBLEMS (pain or burning when urinating, or frequent urination) *BOWEL PROBLEMS (unusual diarrhea, constipation, pain near the anus) TENDERNESS IN MOUTH AND THROAT WITH OR WITHOUT PRESENCE OF ULCERS (sore throat, sores in mouth, or a toothache) UNUSUAL RASH, SWELLING OR PAIN  UNUSUAL VAGINAL DISCHARGE OR ITCHING   Items with * indicate a potential emergency and should be followed up as soon as possible or go to the Emergency Department if any problems should occur.  Please show the CHEMOTHERAPY ALERT CARD or IMMUNOTHERAPY  ALERT CARD at check-in to the Emergency Department and triage nurse.  Should you have questions after your visit or need to cancel or reschedule your appointment, please contact [facility] - A DEPT OF [facility]  [phone] and follow the prompts.  Office hours are 8:00 a.m. to 4:30 p.m. Monday - Friday. Please note that voicemails left after 4:00 p.m. may not be returned until the following business day.  We are closed weekends and major holidays. You have access to a nurse at all times for urgent questions. Please call the main number to the clinic [phone] and follow the prompts.  For any non-urgent questions, you may also contact your provider using MyChart. We now offer e-Visits for anyone 1 and older to request care online for non-urgent symptoms. For details visit [email or web address].   Also download the MyChart app! Go to the app store, search "MyChart", open the app, select [place], and log in with your MyChart username and password.

## 2023-07-02 ENCOUNTER — Encounter: Payer: Self-pay | Admitting: Oncology

## 2023-07-02 ENCOUNTER — Inpatient Hospital Stay

## 2023-07-02 ENCOUNTER — Inpatient Hospital Stay: Admitting: Oncology

## 2023-07-02 VITALS — BP 130/80 | HR 79 | Temp 97.0°F | Resp 17

## 2023-07-02 VITALS — BP 130/82 | HR 94 | Temp 97.7°F | Resp 16 | Wt 159.0 lb

## 2023-07-02 DIAGNOSIS — Z5112 Encounter for antineoplastic immunotherapy: Secondary | ICD-10-CM | POA: Diagnosis not present

## 2023-07-02 DIAGNOSIS — F419 Anxiety disorder, unspecified: Secondary | ICD-10-CM | POA: Diagnosis not present

## 2023-07-02 DIAGNOSIS — G629 Polyneuropathy, unspecified: Secondary | ICD-10-CM | POA: Diagnosis not present

## 2023-07-02 DIAGNOSIS — C3491 Malignant neoplasm of unspecified part of right bronchus or lung: Secondary | ICD-10-CM

## 2023-07-02 DIAGNOSIS — C3411 Malignant neoplasm of upper lobe, right bronchus or lung: Secondary | ICD-10-CM | POA: Diagnosis not present

## 2023-07-02 DIAGNOSIS — Z79899 Other long term (current) drug therapy: Secondary | ICD-10-CM | POA: Diagnosis not present

## 2023-07-02 DIAGNOSIS — Z79620 Long term (current) use of immunosuppressive biologic: Secondary | ICD-10-CM | POA: Diagnosis not present

## 2023-07-02 LAB — CBC WITH DIFFERENTIAL (CANCER CENTER ONLY)
Abs Immature Granulocytes: 0.01 10*3/uL (ref 0.00–0.07)
Basophils Absolute: 0 10*3/uL (ref 0.0–0.1)
Basophils Relative: 1 %
Eosinophils Absolute: 0.1 10*3/uL (ref 0.0–0.5)
Eosinophils Relative: 3 %
HCT: 39 % (ref 36.0–46.0)
Hemoglobin: 13.1 g/dL (ref 12.0–15.0)
Immature Granulocytes: 0 %
Lymphocytes Relative: 27 %
Lymphs Abs: 1.2 10*3/uL (ref 0.7–4.0)
MCH: 29.4 pg (ref 26.0–34.0)
MCHC: 33.6 g/dL (ref 30.0–36.0)
MCV: 87.6 fL (ref 80.0–100.0)
Monocytes Absolute: 0.4 10*3/uL (ref 0.1–1.0)
Monocytes Relative: 10 %
Neutro Abs: 2.6 10*3/uL (ref 1.7–7.7)
Neutrophils Relative %: 59 %
Platelet Count: 260 10*3/uL (ref 150–400)
RBC: 4.45 MIL/uL (ref 3.87–5.11)
RDW: 12.6 % (ref 11.5–15.5)
WBC Count: 4.4 10*3/uL (ref 4.0–10.5)
nRBC: 0 % (ref 0.0–0.2)

## 2023-07-02 LAB — TSH: TSH: 2.611 u[IU]/mL (ref 0.350–4.500)

## 2023-07-02 LAB — CMP (CANCER CENTER ONLY)
ALT: 20 U/L (ref 0–44)
AST: 30 U/L (ref 15–41)
Albumin: 4 g/dL (ref 3.5–5.0)
Alkaline Phosphatase: 84 U/L (ref 38–126)
Anion gap: 8 (ref 5–15)
BUN: 16 mg/dL (ref 8–23)
CO2: 24 mmol/L (ref 22–32)
Calcium: 9.1 mg/dL (ref 8.9–10.3)
Chloride: 103 mmol/L (ref 98–111)
Creatinine: 0.83 mg/dL (ref 0.44–1.00)
GFR, Estimated: >60 mL/min (ref >60)
Glucose, Bld: 157 mg/dL — ABNORMAL HIGH (ref 70–99)
Potassium: 3.8 mmol/L (ref 3.5–5.1)
Sodium: 135 mmol/L (ref 135–145)
Total Bilirubin: 0.7 mg/dL (ref 0.0–1.2)
Total Protein: 7.6 g/dL (ref 6.5–8.1)

## 2023-07-02 MED ORDER — SODIUM CHLORIDE 0.9 % IV SOLN
Freq: Once | INTRAVENOUS | Status: AC
  Filled 2023-07-02: qty 250

## 2023-07-02 MED ORDER — HEPARIN SOD (PORK) LOCK FLUSH 100 UNIT/ML IV SOLN
500.0000 [IU] | Freq: Once | INTRAVENOUS | Status: AC | PRN
  Administered 2023-07-02: 500 [IU]
  Filled 2023-07-02: qty 5

## 2023-07-02 MED ORDER — DURVALUMAB 500 MG/10ML IV SOLN
10.0000 mg/kg | Freq: Once | INTRAVENOUS | Status: AC
  Administered 2023-07-02: 740 mg via INTRAVENOUS
  Filled 2023-07-02: qty 4.8

## 2023-07-02 NOTE — Progress Notes (Signed)
 Pt in for follow up and treatment. Denies any concerns today.

## 2023-07-02 NOTE — Progress Notes (Signed)
 [facility]  Telephone:(336) [phone] Fax:([phone]  ID: Connie West OB: May 14, 1955  MR#: [id number]  AOZ#:[id number]  Patient Care Team: Judithann Novas, MD as PCP - General Drake Gens, RN as Oncology Nurse Navigator Adrian Alba, Deadra Everts, MD as Consulting Physician (Oncology) Pa, [name]  CHIEF COMPLAINT: Stage IIIb adenocarcinoma of the lung.  INTERVAL HISTORY: Patient returns to clinic today for further evaluation and continuation of maintenance durvalumab .  She has intermittent fatigue, but otherwise feels well.  She is tolerating her treatments without significant side effects.  Her peripheral neuropathy is unchanged.  She has no other neurologic complaints.  She denies any recent fevers or illnesses.  She has a good appetite and denies weight loss.  She has no chest pain, shortness of breath, cough, or hemoptysis.  She denies any nausea, vomiting, constipation, or diarrhea.  She has no urinary complaints.  Patient offers no further specific complaints today.  REVIEW OF SYSTEMS:   Review of Systems  Constitutional: Negative.  Negative for fever, malaise/fatigue and weight loss.  Respiratory: Negative.  Negative for cough, hemoptysis and shortness of breath.   Cardiovascular: Negative.  Negative for chest pain and leg swelling.  Gastrointestinal: Negative.  Negative for abdominal pain.  Genitourinary: Negative.  Negative for dysuria, frequency and urgency.  Musculoskeletal: Negative.  Negative for back pain and joint pain.  Skin: Negative.  Negative for rash.  Neurological:  Positive for tingling and sensory change. Negative for dizziness, focal weakness, weakness and headaches.  Psychiatric/Behavioral: Negative.  The patient is not nervous/anxious and does not have insomnia.     As per HPI. Otherwise, a complete review of systems is negative.  PAST MEDICAL HISTORY: Past Medical History:  Diagnosis Date   Complication of anesthesia     Hyperlipidemia    Lung cancer, main bronchus, right (HCC)    Personal history of chemotherapy    Personal history of radiation therapy    PONV (postoperative nausea and vomiting)    TIA (transient ischemic attack)     PAST SURGICAL HISTORY: Past Surgical History:  Procedure Laterality Date   BREAST CYST ASPIRATION Right 07/20/2012   FNA benign   BREAST CYST ASPIRATION Right    BREAST SURGERY Right 07-20-12   FNA benign   BRONCHIAL NEEDLE ASPIRATION BIOPSY  06/07/2022   Procedure: BRONCHIAL NEEDLE ASPIRATION BIOPSIES;  Surgeon: Vergia Glasgow, MD;  Location: [facility];  Service: Pulmonary;;   CESAREAN SECTION     CHOLECYSTECTOMY     COLONOSCOPY WITH PROPOFOL  N/A 03/21/2021   Procedure: COLONOSCOPY WITH PROPOFOL ;  Surgeon: Selena Daily, MD;  Location: [facility];  Service: Gastroenterology;  Laterality: N/A;   IR IMAGING GUIDED PORT INSERTION  06/21/2022   OVARY SURGERY     VIDEO BRONCHOSCOPY WITH ENDOBRONCHIAL ULTRASOUND N/A 06/07/2022   Procedure: VIDEO BRONCHOSCOPY WITH ENDOBRONCHIAL ULTRASOUND;  Surgeon: Vergia Glasgow, MD;  Location: [facility];  Service: Pulmonary;  Laterality: N/A;    FAMILY HISTORY: Family History  Problem Relation Age of Onset   Breast cancer Mother 48   Cancer Mother        breast   Cancer Father        bone cancer    ADVANCED DIRECTIVES (Y/N):  N  HEALTH MAINTENANCE: Social History   Tobacco Use   Smoking status: Former    Current packs/day: 0.00    Average packs/day: 0.6 packs/day for 43.0 years (26.7 ttl pk-yrs)    Types: Cigarettes    Start date: 52  Quit date: 2016    Years since quitting: 9.3   Smokeless tobacco: Never   Tobacco comments:    quit x 1 month 11/16  Vaping Use   Vaping status: Never Used  Substance Use Topics   Alcohol use: Yes    Comment: occasional : 1x/week   Drug use: No     Colonoscopy:  PAP:  Bone density:  Lipid panel:  Allergies  Allergen Reactions   Penicillins Hives    Current  Outpatient Medications  Medication Sig Dispense Refill   aspirin EC 81 MG tablet Take 81 mg by mouth in the morning.     buPROPion  (WELLBUTRIN  XL) 150 MG 24 hr tablet TAKE 1 TABLET BY MOUTH IN THE MORNING. FOR MOOD AND FATIGUE. 90 tablet 1   calcium carbonate (OS-CAL) 600 MG TABS Take 600 mg by mouth every evening.     Coenzyme Q10 (CO Q 10 PO) Take 1 capsule by mouth in the morning.     Fluocinolone  Acetonide 0.01 % OIL Apply twice daily to ears as needed for rash/itching 20 mL 2   ibuprofen (ADVIL,MOTRIN) 200 MG tablet Take 400 mg by mouth every 8 (eight) hours as needed (pain.).     ketoconazole  (NIZORAL ) 2 % shampoo 2-3 times per week lather on scalp and ears, leave on 8-10 minutes, rinse well (Patient taking differently: Apply 1 Application topically once a week. Once per week lather on scalp and ears, leave on 8-10 minutes, rinse well) 120 mL 5   lidocaine -prilocaine  (EMLA ) cream Apply 1 Application topically as needed.     MAGNESIUM PO Take 1 tablet by mouth every evening.     OMEGA-3 FATTY ACIDS PO Take 1 g by mouth every evening.     simvastatin  (ZOCOR ) 40 MG tablet TAKE 1 TABLET BY MOUTH EVERY DAY 90 tablet 3   VITAMIN D  PO Take 1,000 Units by mouth in the morning.     No current facility-administered medications for this visit.    OBJECTIVE: Vitals:   07/02/23 0856  BP: 130/82  Pulse: 94  Resp: 16  Temp: 97.7 F (36.5 C)  SpO2: 98%     Body mass index is 27.29 kg/m.    ECOG FS:0 - Asymptomatic  General: Well-developed, well-nourished, no acute distress. Eyes: Pink conjunctiva, anicteric sclera. HEENT: Normocephalic, moist mucous membranes. Lungs: No audible wheezing or coughing. Heart: Regular rate and rhythm. Abdomen: Soft, nontender, no obvious distention. Musculoskeletal: No edema, cyanosis, or clubbing. Neuro: Alert, answering all questions appropriately. Cranial nerves grossly intact. Skin: No rashes or petechiae noted. Psych: Normal affect.  LAB  RESULTS:  Lab Results  Component Value Date   NA 135 07/02/2023   K 3.8 07/02/2023   CL 103 07/02/2023   CO2 24 07/02/2023   GLUCOSE 157 (H) 07/02/2023   BUN 16 07/02/2023   CREATININE 0.83 07/02/2023   CALCIUM 9.1 07/02/2023   PROT 7.6 07/02/2023   ALBUMIN 4.0 07/02/2023   AST 30 07/02/2023   ALT 20 07/02/2023   ALKPHOS 84 07/02/2023   BILITOT 0.7 07/02/2023   GFRNONAA >60 07/02/2023   GFRAA >60 01/04/2018    Lab Results  Component Value Date   WBC 4.4 07/02/2023   NEUTROABS 2.6 07/02/2023   HGB 13.1 07/02/2023   HCT 39.0 07/02/2023   MCV 87.6 07/02/2023   PLT 260 07/02/2023     STUDIES: No results found.    ASSESSMENT: Stage IIIb adenocarcinoma of the lung.  PLAN:    Stage IIIb adenocarcinoma of  the lung: Biopsy from bronchoscopy on June 07, 2022 confirming the diagnosis.  PET scan results from May 22, 2022 reviewed independently confirming stage of disease.  The bilateral hypermetabolic cervical lymph nodes are suspicious, but will treat patient as a stage IIIb.  MRI of the brain on Jun 19, 2022 did not reveal any metastatic disease.  Patient completed weekly carboplatin  and Taxol  on July 31, 2022.  Patient now receiving maintenance durvalumab  every 2 weeks for 1 year.  She initiated maintenance treatment on August 14, 2022.  Her most recent imaging results from CT scan on May 25, 2023 reviewed independently and reported as above with no obvious evidence of recurrent or progressive disease.  Proceed with treatment today.  Return to clinic in 2 weeks for treatment only and then in 4 weeks for further evaluation and continuation of durvalumab .  Will repeat imaging in October 2025.   Peripheral neuropathy: Chronic and unchanged. Adjustment/anxiety: Continue Wellbutrin  as prescribed.  I spent a total of 30 minutes reviewing chart data, face-to-face evaluation with the patient, counseling and coordination of care as detailed above.    Patient expressed understanding  and was in agreement with this plan. She also understands that She can call clinic at any time with any questions, concerns, or complaints.    Cancer Staging  Adenocarcinoma of right lung [facility]) Staging form: Lung, AJCC 8th Edition - Clinical stage from 06/12/2022: Stage IIIB (cT2b, cN3, cM0) - Signed by Shellie Dials, MD on 06/12/2022 Stage prefix: Initial diagnosis   Shellie Dials, MD   07/02/2023 9:27 AM

## 2023-07-02 NOTE — Patient Instructions (Signed)
 [facility] - A DEPT OF [facility]  Discharge Instructions: Thank you for choosing [facility] to provide your oncology and hematology care.  If you have a lab appointment with the Cancer Center, please go directly to the Cancer Center and check in at the registration area.  Wear comfortable clothing and clothing appropriate for easy access to any Portacath or PICC line.   We strive to give you quality time with your provider. You may need to reschedule your appointment if you arrive late (15 or more minutes).  Arriving late affects you and other patients whose appointments are after yours.  Also, if you miss three or more appointments without notifying the office, you may be dismissed from the clinic at the provider's discretion.      For prescription refill requests, have your pharmacy contact our office and allow 72 hours for refills to be completed.    Today you received the following chemotherapy and/or immunotherapy agents Imfinzi      To help prevent nausea and vomiting after your treatment, we encourage you to take your nausea medication as directed.  BELOW ARE SYMPTOMS THAT SHOULD BE REPORTED IMMEDIATELY: *FEVER GREATER THAN 100.4 F (38 C) OR HIGHER *CHILLS OR SWEATING *NAUSEA AND VOMITING THAT IS NOT CONTROLLED WITH YOUR NAUSEA MEDICATION *UNUSUAL SHORTNESS OF BREATH *UNUSUAL BRUISING OR BLEEDING *URINARY PROBLEMS (pain or burning when urinating, or frequent urination) *BOWEL PROBLEMS (unusual diarrhea, constipation, pain near the anus) TENDERNESS IN MOUTH AND THROAT WITH OR WITHOUT PRESENCE OF ULCERS (sore throat, sores in mouth, or a toothache) UNUSUAL RASH, SWELLING OR PAIN  UNUSUAL VAGINAL DISCHARGE OR ITCHING   Items with * indicate a potential emergency and should be followed up as soon as possible or go to the Emergency Department if any problems should occur.  Please show the CHEMOTHERAPY ALERT CARD or IMMUNOTHERAPY  ALERT CARD at check-in to the Emergency Department and triage nurse.  Should you have questions after your visit or need to cancel or reschedule your appointment, please contact [facility] - A DEPT OF [facility]  [phone] and follow the prompts.  Office hours are 8:00 a.m. to 4:30 p.m. Monday - Friday. Please note that voicemails left after 4:00 p.m. may not be returned until the following business day.  We are closed weekends and major holidays. You have access to a nurse at all times for urgent questions. Please call the main number to the clinic [phone] and follow the prompts.  For any non-urgent questions, you may also contact your provider using MyChart. We now offer e-Visits for anyone 1 and older to request care online for non-urgent symptoms. For details visit [email or web address].   Also download the MyChart app! Go to the app store, search "MyChart", open the app, select [place], and log in with your MyChart username and password.

## 2023-07-03 LAB — T4: T4, Total: 8 ug/dL (ref 4.5–12.0)

## 2023-07-08 ENCOUNTER — Other Ambulatory Visit: Payer: Self-pay | Admitting: Nurse Practitioner

## 2023-07-16 ENCOUNTER — Inpatient Hospital Stay: Attending: Oncology

## 2023-07-16 VITALS — BP 137/87 | HR 87 | Temp 96.8°F | Resp 18 | Ht 64.0 in | Wt 159.8 lb

## 2023-07-16 DIAGNOSIS — Z79899 Other long term (current) drug therapy: Secondary | ICD-10-CM | POA: Insufficient documentation

## 2023-07-16 DIAGNOSIS — F419 Anxiety disorder, unspecified: Secondary | ICD-10-CM | POA: Diagnosis not present

## 2023-07-16 DIAGNOSIS — G629 Polyneuropathy, unspecified: Secondary | ICD-10-CM | POA: Diagnosis not present

## 2023-07-16 DIAGNOSIS — C3411 Malignant neoplasm of upper lobe, right bronchus or lung: Secondary | ICD-10-CM | POA: Insufficient documentation

## 2023-07-16 DIAGNOSIS — C3491 Malignant neoplasm of unspecified part of right bronchus or lung: Secondary | ICD-10-CM

## 2023-07-16 DIAGNOSIS — Z79620 Long term (current) use of immunosuppressive biologic: Secondary | ICD-10-CM | POA: Diagnosis not present

## 2023-07-16 DIAGNOSIS — Z5112 Encounter for antineoplastic immunotherapy: Secondary | ICD-10-CM | POA: Diagnosis not present

## 2023-07-16 MED ORDER — SODIUM CHLORIDE 0.9 % IV SOLN
10.0000 mg/kg | Freq: Once | INTRAVENOUS | Status: AC
  Administered 2023-07-16: 740 mg via INTRAVENOUS
  Filled 2023-07-16: qty 4.8

## 2023-07-16 MED ORDER — SODIUM CHLORIDE 0.9 % IV SOLN
Freq: Once | INTRAVENOUS | Status: AC
  Filled 2023-07-16: qty 250

## 2023-07-16 MED ORDER — HEPARIN SOD (PORK) LOCK FLUSH 100 UNIT/ML IV SOLN
500.0000 [IU] | Freq: Once | INTRAVENOUS | Status: AC | PRN
  Administered 2023-07-16: 500 [IU]
  Filled 2023-07-16: qty 5

## 2023-07-16 NOTE — Patient Instructions (Signed)
 [facility]  Discharge Instructions: Thank you for choosing [facility] to provide your oncology and hematology care.  If you have a lab appointment with the Cancer Center, please go directly to the Cancer Center and check in at the registration area.  Wear comfortable clothing and clothing appropriate for easy access to any Portacath or PICC line.   We strive to give you quality time with your provider. You may need to reschedule your appointment if you arrive late (15 or more minutes).  Arriving late affects you and other patients whose appointments are after yours.  Also, if you miss three or more appointments without notifying the office, you may be dismissed from the clinic at the provider's discretion.      For prescription refill requests, have your pharmacy contact our office and allow 72 hours for refills to be completed.    Today you received the following chemotherapy and/or immunotherapy agents imfinzi      To help prevent nausea and vomiting after your treatment, we encourage you to take your nausea medication as directed.  BELOW ARE SYMPTOMS THAT SHOULD BE REPORTED IMMEDIATELY: *FEVER GREATER THAN 100.4 F (38 C) OR HIGHER *CHILLS OR SWEATING *NAUSEA AND VOMITING THAT IS NOT CONTROLLED WITH YOUR NAUSEA MEDICATION *UNUSUAL SHORTNESS OF BREATH *UNUSUAL BRUISING OR BLEEDING *URINARY PROBLEMS (pain or burning when urinating, or frequent urination) *BOWEL PROBLEMS (unusual diarrhea, constipation, pain near the anus) TENDERNESS IN MOUTH AND THROAT WITH OR WITHOUT PRESENCE OF ULCERS (sore throat, sores in mouth, or a toothache) UNUSUAL RASH, SWELLING OR PAIN  UNUSUAL VAGINAL DISCHARGE OR ITCHING   Items with * indicate a potential emergency and should be followed up as soon as possible or go to the Emergency Department if any problems should occur.  Please show the CHEMOTHERAPY ALERT CARD or IMMUNOTHERAPY  ALERT CARD at check-in to the Emergency Department and triage nurse.  Should you have questions after your visit or need to cancel or reschedule your appointment, please contact [facility] - A DEPT OF [facility]  [phone] and follow the prompts.  Office hours are 8:00 a.m. to 4:30 p.m. Monday - Friday. Please note that voicemails left after 4:00 p.m. may not be returned until the following business day.  We are closed weekends and major holidays. You have access to a nurse at all times for urgent questions. Please call the main number to the clinic [phone] and follow the prompts.  For any non-urgent questions, you may also contact your provider using MyChart. We now offer e-Visits for anyone 4 and older to request care online for non-urgent symptoms. For details visit [email or web address].   Also download the MyChart app! Go to the app store, search "MyChart", open the app, select [place], and log in with your MyChart username and password.

## 2023-07-30 ENCOUNTER — Inpatient Hospital Stay

## 2023-07-30 ENCOUNTER — Inpatient Hospital Stay: Admitting: Oncology

## 2023-07-30 ENCOUNTER — Encounter: Payer: Self-pay | Admitting: Oncology

## 2023-07-30 VITALS — BP 136/87 | HR 90 | Temp 97.5°F | Resp 16 | Ht 64.0 in | Wt 160.2 lb

## 2023-07-30 VITALS — BP 131/84 | HR 81

## 2023-07-30 DIAGNOSIS — C3491 Malignant neoplasm of unspecified part of right bronchus or lung: Secondary | ICD-10-CM | POA: Diagnosis not present

## 2023-07-30 DIAGNOSIS — C3411 Malignant neoplasm of upper lobe, right bronchus or lung: Secondary | ICD-10-CM | POA: Diagnosis not present

## 2023-07-30 DIAGNOSIS — Z79620 Long term (current) use of immunosuppressive biologic: Secondary | ICD-10-CM | POA: Diagnosis not present

## 2023-07-30 DIAGNOSIS — G629 Polyneuropathy, unspecified: Secondary | ICD-10-CM | POA: Diagnosis not present

## 2023-07-30 DIAGNOSIS — F419 Anxiety disorder, unspecified: Secondary | ICD-10-CM | POA: Diagnosis not present

## 2023-07-30 DIAGNOSIS — Z79899 Other long term (current) drug therapy: Secondary | ICD-10-CM | POA: Diagnosis not present

## 2023-07-30 DIAGNOSIS — Z5112 Encounter for antineoplastic immunotherapy: Secondary | ICD-10-CM | POA: Diagnosis not present

## 2023-07-30 LAB — CMP (CANCER CENTER ONLY)
ALT: 23 U/L (ref 0–44)
AST: 30 U/L (ref 15–41)
Albumin: 3.7 g/dL (ref 3.5–5.0)
Alkaline Phosphatase: 80 U/L (ref 38–126)
Anion gap: 10 (ref 5–15)
BUN: 16 mg/dL (ref 8–23)
CO2: 23 mmol/L (ref 22–32)
Calcium: 9.3 mg/dL (ref 8.9–10.3)
Chloride: 104 mmol/L (ref 98–111)
Creatinine: 0.85 mg/dL (ref 0.44–1.00)
GFR, Estimated: >60 mL/min (ref >60)
Glucose, Bld: 117 mg/dL — ABNORMAL HIGH (ref 70–99)
Potassium: 4 mmol/L (ref 3.5–5.1)
Sodium: 137 mmol/L (ref 135–145)
Total Bilirubin: 0.9 mg/dL (ref 0.0–1.2)
Total Protein: 7.4 g/dL (ref 6.5–8.1)

## 2023-07-30 LAB — CBC WITH DIFFERENTIAL (CANCER CENTER ONLY)
Abs Immature Granulocytes: 0.02 10*3/uL (ref 0.00–0.07)
Basophils Absolute: 0 10*3/uL (ref 0.0–0.1)
Basophils Relative: 0 %
Eosinophils Absolute: 0.1 10*3/uL (ref 0.0–0.5)
Eosinophils Relative: 2 %
HCT: 38.3 % (ref 36.0–46.0)
Hemoglobin: 12.9 g/dL (ref 12.0–15.0)
Immature Granulocytes: 0 %
Lymphocytes Relative: 18 %
Lymphs Abs: 1 10*3/uL (ref 0.7–4.0)
MCH: 29.7 pg (ref 26.0–34.0)
MCHC: 33.7 g/dL (ref 30.0–36.0)
MCV: 88.2 fL (ref 80.0–100.0)
Monocytes Absolute: 0.6 10*3/uL (ref 0.1–1.0)
Monocytes Relative: 10 %
Neutro Abs: 3.8 10*3/uL (ref 1.7–7.7)
Neutrophils Relative %: 70 %
Platelet Count: 266 10*3/uL (ref 150–400)
RBC: 4.34 MIL/uL (ref 3.87–5.11)
RDW: 12.6 % (ref 11.5–15.5)
WBC Count: 5.5 10*3/uL (ref 4.0–10.5)
nRBC: 0 % (ref 0.0–0.2)

## 2023-07-30 LAB — TSH: TSH: 2.53 u[IU]/mL (ref 0.350–4.500)

## 2023-07-30 MED ORDER — HEPARIN SOD (PORK) LOCK FLUSH 100 UNIT/ML IV SOLN
500.0000 [IU] | Freq: Once | INTRAVENOUS | Status: AC | PRN
  Administered 2023-07-30: 500 [IU]
  Filled 2023-07-30: qty 5

## 2023-07-30 MED ORDER — SODIUM CHLORIDE 0.9% FLUSH
10.0000 mL | INTRAVENOUS | Status: DC | PRN
  Administered 2023-07-30: 10 mL
  Filled 2023-07-30: qty 10

## 2023-07-30 MED ORDER — SODIUM CHLORIDE 0.9 % IV SOLN
10.0000 mg/kg | Freq: Once | INTRAVENOUS | Status: AC
  Administered 2023-07-30: 740 mg via INTRAVENOUS
  Filled 2023-07-30: qty 4.8

## 2023-07-30 MED ORDER — SODIUM CHLORIDE 0.9 % IV SOLN
Freq: Once | INTRAVENOUS | Status: AC
  Filled 2023-07-30: qty 250

## 2023-07-30 NOTE — Patient Instructions (Signed)
 [facility]  Discharge Instructions: Thank you for choosing [facility] to provide your oncology and hematology care.  If you have a lab appointment with the Cancer Center, please go directly to the Cancer Center and check in at the registration area.  Wear comfortable clothing and clothing appropriate for easy access to any Portacath or PICC line.   We strive to give you quality time with your provider. You may need to reschedule your appointment if you arrive late (15 or more minutes).  Arriving late affects you and other patients whose appointments are after yours.  Also, if you miss three or more appointments without notifying the office, you may be dismissed from the clinic at the provider's discretion.      For prescription refill requests, have your pharmacy contact our office and allow 72 hours for refills to be completed.    Today you received the following chemotherapy and/or immunotherapy agents imfinzi      To help prevent nausea and vomiting after your treatment, we encourage you to take your nausea medication as directed.  BELOW ARE SYMPTOMS THAT SHOULD BE REPORTED IMMEDIATELY: *FEVER GREATER THAN 100.4 F (38 C) OR HIGHER *CHILLS OR SWEATING *NAUSEA AND VOMITING THAT IS NOT CONTROLLED WITH YOUR NAUSEA MEDICATION *UNUSUAL SHORTNESS OF BREATH *UNUSUAL BRUISING OR BLEEDING *URINARY PROBLEMS (pain or burning when urinating, or frequent urination) *BOWEL PROBLEMS (unusual diarrhea, constipation, pain near the anus) TENDERNESS IN MOUTH AND THROAT WITH OR WITHOUT PRESENCE OF ULCERS (sore throat, sores in mouth, or a toothache) UNUSUAL RASH, SWELLING OR PAIN  UNUSUAL VAGINAL DISCHARGE OR ITCHING   Items with * indicate a potential emergency and should be followed up as soon as possible or go to the Emergency Department if any problems should occur.  Please show the CHEMOTHERAPY ALERT CARD or IMMUNOTHERAPY  ALERT CARD at check-in to the Emergency Department and triage nurse.  Should you have questions after your visit or need to cancel or reschedule your appointment, please contact [facility] - A DEPT OF [facility]  [phone] and follow the prompts.  Office hours are 8:00 a.m. to 4:30 p.m. Monday - Friday. Please note that voicemails left after 4:00 p.m. may not be returned until the following business day.  We are closed weekends and major holidays. You have access to a nurse at all times for urgent questions. Please call the main number to the clinic [phone] and follow the prompts.  For any non-urgent questions, you may also contact your provider using MyChart. We now offer e-Visits for anyone 4 and older to request care online for non-urgent symptoms. For details visit [email or web address].   Also download the MyChart app! Go to the app store, search "MyChart", open the app, select [place], and log in with your MyChart username and password.

## 2023-07-30 NOTE — Progress Notes (Signed)
 [facility]  [phone] Fax:([phone]  ID: Connie West OB: 1955-03-16  MR#: [id number]  YQM#:[id number]  Patient Care Team: Judithann Novas, MD as PCP - General Drake Gens, RN as Oncology Nurse Navigator Adrian Alba, Deadra Everts, MD as Consulting Physician (Oncology) Pa, [name]  CHIEF COMPLAINT: Stage IIIb adenocarcinoma of the lung.  INTERVAL HISTORY: Patient returns to clinic today for further evaluation and continuation of maintenance durvalumab .  She continues to have mild fatigue, but otherwise feels well.  She is otherwise tolerating her treatments without significant side effects.  Her peripheral neuropathy is unchanged.  She has no other neurologic complaints.  She denies any recent fevers or illnesses.  She has a good appetite and denies weight loss.  She has no chest pain, shortness of breath, cough, or hemoptysis.  She denies any nausea, vomiting, constipation, or diarrhea.  She has no urinary complaints.  Patient offers no further specific complaints today.  REVIEW OF SYSTEMS:   Review of Systems  Constitutional:  Positive for malaise/fatigue. Negative for fever and weight loss.  Respiratory: Negative.  Negative for cough, hemoptysis and shortness of breath.   Cardiovascular: Negative.  Negative for chest pain and leg swelling.  Gastrointestinal: Negative.  Negative for abdominal pain.  Genitourinary: Negative.  Negative for dysuria, frequency and urgency.  Musculoskeletal: Negative.  Negative for back pain and joint pain.  Skin: Negative.  Negative for rash.  Neurological:  Positive for tingling and sensory change. Negative for dizziness, focal weakness, weakness and headaches.  Psychiatric/Behavioral: Negative.  The patient is not nervous/anxious and does not have insomnia.     As per HPI. Otherwise, a complete review of systems is negative.  PAST MEDICAL HISTORY: Past Medical History:  Diagnosis Date   Allergy 1998    Penicillin   Complication of anesthesia    GERD (gastroesophageal reflux disease) 2015   Only occasionally   Hyperlipidemia    Lung cancer, main bronchus, right (HCC)    Personal history of chemotherapy    Personal history of radiation therapy    PONV (postoperative nausea and vomiting)    Stroke (HCC) 2019   TIA (transient ischemic attack)     PAST SURGICAL HISTORY: Past Surgical History:  Procedure Laterality Date   BREAST CYST ASPIRATION Right 07/20/2012   FNA benign   BREAST CYST ASPIRATION Right    BREAST SURGERY Right 07/20/2012   FNA benign   BRONCHIAL NEEDLE ASPIRATION BIOPSY  06/07/2022   Procedure: BRONCHIAL NEEDLE ASPIRATION BIOPSIES;  Surgeon: Vergia Glasgow, MD;  Location: [facility];  Service: Pulmonary;;   CESAREAN SECTION     CHOLECYSTECTOMY     COLONOSCOPY WITH PROPOFOL  N/A 03/21/2021   Procedure: COLONOSCOPY WITH PROPOFOL ;  Surgeon: Selena Daily, MD;  Location: [facility]COPY;  Service: Gastroenterology;  Laterality: N/A;   IR IMAGING GUIDED PORT INSERTION  06/21/2022   OVARY SURGERY     TUBAL LIGATION  1990   VIDEO BRONCHOSCOPY WITH ENDOBRONCHIAL ULTRASOUND N/A 06/07/2022   Procedure: VIDEO BRONCHOSCOPY WITH ENDOBRONCHIAL ULTRASOUND;  Surgeon: Vergia Glasgow, MD;  Location: [facility];  Service: Pulmonary;  Laterality: N/A;    FAMILY HISTORY: Family History  Problem Relation Age of Onset   Breast cancer Mother 85   Cancer Mother        breast   Cancer Father        bone cancer    ADVANCED DIRECTIVES (Y/N):  N  HEALTH MAINTENANCE: Social History   Tobacco Use  Smoking status: Former    Current packs/day: 0.00    Average packs/day: 0.6 packs/day for 43.0 years (26.7 ttl pk-yrs)    Types: Cigarettes    Start date: 86    Quit date: 2016    Years since quitting: 9.4   Smokeless tobacco: Never   Tobacco comments:    quit x 1 month 11/16  Vaping Use   Vaping status: Never Used  Substance Use Topics   Alcohol use: Yes     Comment: occasional : 1x/week   Drug use: No     Colonoscopy:  PAP:  Bone density:  Lipid panel:  Allergies  Allergen Reactions   Penicillins Hives    Current Outpatient Medications  Medication Sig Dispense Refill   aspirin EC 81 MG tablet Take 81 mg by mouth in the morning.     buPROPion  (WELLBUTRIN  XL) 150 MG 24 hr tablet TAKE 1 TABLET BY MOUTH IN THE MORNING. FOR MOOD AND FATIGUE. 90 tablet 1   calcium carbonate (OS-CAL) 600 MG TABS Take 600 mg by mouth every evening.     Coenzyme Q10 (CO Q 10 PO) Take 1 capsule by mouth in the morning.     Fluocinolone  Acetonide 0.01 % OIL Apply twice daily to ears as needed for rash/itching 20 mL 2   ibuprofen (ADVIL,MOTRIN) 200 MG tablet Take 400 mg by mouth every 8 (eight) hours as needed (pain.).     ketoconazole  (NIZORAL ) 2 % shampoo 2-3 times per week lather on scalp and ears, leave on 8-10 minutes, rinse well (Patient taking differently: Apply 1 Application topically once a week. Once per week lather on scalp and ears, leave on 8-10 minutes, rinse well) 120 mL 5   lidocaine -prilocaine  (EMLA ) cream Apply 1 Application topically as needed.     MAGNESIUM PO Take 1 tablet by mouth every evening.     OMEGA-3 FATTY ACIDS PO Take 1 g by mouth every evening.     simvastatin  (ZOCOR ) 40 MG tablet TAKE 1 TABLET BY MOUTH EVERY DAY 90 tablet 3   VITAMIN D  PO Take 1,000 Units by mouth in the morning.     No current facility-administered medications for this visit.    OBJECTIVE: Vitals:   07/30/23 0935  BP: 136/87  Pulse: 90  Resp: 16  Temp: (!) 97.5 F (36.4 C)  SpO2: 100%     Body mass index is 27.5 kg/m.    ECOG FS:0 - Asymptomatic  General: Well-developed, well-nourished, no acute distress. Eyes: Pink conjunctiva, anicteric sclera. HEENT: Normocephalic, moist mucous membranes. Lungs: No audible wheezing or coughing. Heart: Regular rate and rhythm. Abdomen: Soft, nontender, no obvious distention. Musculoskeletal: No edema, cyanosis,  or clubbing. Neuro: Alert, answering all questions appropriately. Cranial nerves grossly intact. Skin: No rashes or petechiae noted. Psych: Normal affect.  LAB RESULTS:  Lab Results  Component Value Date   NA 137 07/30/2023   K 4.0 07/30/2023   CL 104 07/30/2023   CO2 23 07/30/2023   GLUCOSE 117 (H) 07/30/2023   BUN 16 07/30/2023   CREATININE 0.85 07/30/2023   CALCIUM 9.3 07/30/2023   PROT 7.4 07/30/2023   ALBUMIN 3.7 07/30/2023   AST 30 07/30/2023   ALT 23 07/30/2023   ALKPHOS 80 07/30/2023   BILITOT 0.9 07/30/2023   GFRNONAA >60 07/30/2023   GFRAA >60 01/04/2018    Lab Results  Component Value Date   WBC 5.5 07/30/2023   NEUTROABS 3.8 07/30/2023   HGB 12.9 07/30/2023   HCT 38.3 07/30/2023  MCV 88.2 07/30/2023   PLT 266 07/30/2023    STUDIES: No results found.  ASSESSMENT: Stage IIIb adenocarcinoma of the lung.  PLAN:    Stage IIIb adenocarcinoma of the lung: Biopsy from bronchoscopy on June 07, 2022 confirming the diagnosis.  PET scan results from May 22, 2022 reviewed independently confirming stage of disease.  The bilateral hypermetabolic cervical lymph nodes are suspicious, but will treat patient as a stage IIIb.  MRI of the brain on Jun 19, 2022 did not reveal any metastatic disease.  Patient completed weekly carboplatin  and Taxol  on July 31, 2022.  Patient now receiving maintenance durvalumab  every 2 weeks for 1 year.  She initiated maintenance treatment on August 14, 2022.  Her most recent imaging results from CT scan on May 25, 2023 reviewed independently with no obvious evidence of recurrent or progressive disease.  Proceed with treatment today.  Return to clinic in 2 weeks for further evaluation and her final infusion of maintenance durvalumab .  Will repeat imaging in October 2025.   Peripheral neuropathy: Chronic and unchanged.   Adjustment/anxiety: Continue Wellbutrin  as prescribed.   I spent a total of 30 minutes reviewing chart data, face-to-face  evaluation with the patient, counseling and coordination of care as detailed above.   Patient expressed understanding and was in agreement with this plan. She also understands that She can call clinic at any time with any questions, concerns, or complaints.    Cancer Staging  Adenocarcinoma of right lung [facility]) Staging form: Lung, AJCC 8th Edition - Clinical stage from 06/12/2022: Stage IIIB (cT2b, cN3, cM0) - Signed by Shellie Dials, MD on 06/12/2022 Stage prefix: Initial diagnosis   Shellie Dials, MD   07/30/2023 10:08 AM

## 2023-07-31 LAB — T4: T4, Total: 8.5 ug/dL (ref 4.5–12.0)

## 2023-08-13 ENCOUNTER — Encounter: Payer: Self-pay | Admitting: Oncology

## 2023-08-13 ENCOUNTER — Inpatient Hospital Stay: Attending: Oncology

## 2023-08-13 ENCOUNTER — Inpatient Hospital Stay

## 2023-08-13 ENCOUNTER — Inpatient Hospital Stay: Admitting: Oncology

## 2023-08-13 VITALS — BP 138/92 | HR 83

## 2023-08-13 VITALS — BP 138/89 | HR 87 | Temp 97.2°F | Resp 16 | Ht 64.0 in | Wt 161.0 lb

## 2023-08-13 DIAGNOSIS — C3491 Malignant neoplasm of unspecified part of right bronchus or lung: Secondary | ICD-10-CM | POA: Diagnosis not present

## 2023-08-13 DIAGNOSIS — Z79620 Long term (current) use of immunosuppressive biologic: Secondary | ICD-10-CM | POA: Diagnosis not present

## 2023-08-13 DIAGNOSIS — G629 Polyneuropathy, unspecified: Secondary | ICD-10-CM | POA: Insufficient documentation

## 2023-08-13 DIAGNOSIS — Z5112 Encounter for antineoplastic immunotherapy: Secondary | ICD-10-CM | POA: Insufficient documentation

## 2023-08-13 DIAGNOSIS — C3411 Malignant neoplasm of upper lobe, right bronchus or lung: Secondary | ICD-10-CM | POA: Insufficient documentation

## 2023-08-13 LAB — CBC WITH DIFFERENTIAL (CANCER CENTER ONLY)
Abs Immature Granulocytes: 0.02 10*3/uL (ref 0.00–0.07)
Basophils Absolute: 0 10*3/uL (ref 0.0–0.1)
Basophils Relative: 1 %
Eosinophils Absolute: 0.1 10*3/uL (ref 0.0–0.5)
Eosinophils Relative: 3 %
HCT: 37.7 % (ref 36.0–46.0)
Hemoglobin: 12.7 g/dL (ref 12.0–15.0)
Immature Granulocytes: 0 %
Lymphocytes Relative: 22 %
Lymphs Abs: 1.1 10*3/uL (ref 0.7–4.0)
MCH: 30 pg (ref 26.0–34.0)
MCHC: 33.7 g/dL (ref 30.0–36.0)
MCV: 88.9 fL (ref 80.0–100.0)
Monocytes Absolute: 0.5 10*3/uL (ref 0.1–1.0)
Monocytes Relative: 9 %
Neutro Abs: 3.3 10*3/uL (ref 1.7–7.7)
Neutrophils Relative %: 65 %
Platelet Count: 254 10*3/uL (ref 150–400)
RBC: 4.24 MIL/uL (ref 3.87–5.11)
RDW: 12.6 % (ref 11.5–15.5)
WBC Count: 4.9 10*3/uL (ref 4.0–10.5)
nRBC: 0 % (ref 0.0–0.2)

## 2023-08-13 LAB — CMP (CANCER CENTER ONLY)
ALT: 21 U/L (ref 0–44)
AST: 32 U/L (ref 15–41)
Albumin: 3.9 g/dL (ref 3.5–5.0)
Alkaline Phosphatase: 79 U/L (ref 38–126)
Anion gap: 9 (ref 5–15)
BUN: 12 mg/dL (ref 8–23)
CO2: 22 mmol/L (ref 22–32)
Calcium: 8.9 mg/dL (ref 8.9–10.3)
Chloride: 104 mmol/L (ref 98–111)
Creatinine: 0.93 mg/dL (ref 0.44–1.00)
GFR, Estimated: >60 mL/min (ref >60)
Glucose, Bld: 151 mg/dL — ABNORMAL HIGH (ref 70–99)
Potassium: 3.7 mmol/L (ref 3.5–5.1)
Sodium: 135 mmol/L (ref 135–145)
Total Bilirubin: 0.7 mg/dL (ref 0.0–1.2)
Total Protein: 7.2 g/dL (ref 6.5–8.1)

## 2023-08-13 LAB — TSH: TSH: 2.991 u[IU]/mL (ref 0.350–4.500)

## 2023-08-13 MED ORDER — HEPARIN SOD (PORK) LOCK FLUSH 100 UNIT/ML IV SOLN
500.0000 [IU] | Freq: Once | INTRAVENOUS | Status: AC | PRN
  Administered 2023-08-13: 500 [IU]
  Filled 2023-08-13: qty 5

## 2023-08-13 MED ORDER — SODIUM CHLORIDE 0.9 % IV SOLN
Freq: Once | INTRAVENOUS | Status: AC
  Filled 2023-08-13: qty 250

## 2023-08-13 MED ORDER — SODIUM CHLORIDE 0.9 % IV SOLN
10.0000 mg/kg | Freq: Once | INTRAVENOUS | Status: AC
  Administered 2023-08-13: 740 mg via INTRAVENOUS
  Filled 2023-08-13: qty 4.8

## 2023-08-13 NOTE — Progress Notes (Signed)
 [name]  Telephone:(336) [phone] Fax:(336) [phone]  ID: Connie West Sar OB: 1955-09-29  MR#: [id number]  RDW#:[id number]  Patient Care Team: [name] BRAVO, MD as PCP - General [name], RN as Oncology Nurse Navigator [name], [name], MD as Consulting Physician (Oncology) Pa, [name]  CHIEF COMPLAINT: Stage IIIb adenocarcinoma of the lung.  INTERVAL HISTORY: Patient returns to clinic today for further evaluation and her final infusion of maintenance durvalumab .  She currently feels well and is asymptomatic.  She does not complain of weakness or fatigue today.  She is tolerating her treatments without significant side effects.  She is planning a trip back to [place] in the next 1 to 2 months.  Her peripheral neuropathy is unchanged.  She has no other neurologic complaints.  She denies any recent fevers or illnesses.  She has a good appetite and denies weight loss.  She has no chest pain, shortness of breath, cough, or hemoptysis.  She denies any nausea, vomiting, constipation, or diarrhea.  She has no urinary complaints.  Patient offers no further specific complaints today.  REVIEW OF SYSTEMS:   Review of Systems  Constitutional: Negative.  Negative for fever, malaise/fatigue and weight loss.  Respiratory: Negative.  Negative for cough, hemoptysis and shortness of breath.   Cardiovascular: Negative.  Negative for chest pain and leg swelling.  Gastrointestinal: Negative.  Negative for abdominal pain.  Genitourinary: Negative.  Negative for dysuria, frequency and urgency.  Musculoskeletal: Negative.  Negative for back pain and joint pain.  Skin: Negative.  Negative for rash.  Neurological:  Positive for tingling and sensory change. Negative for dizziness, focal weakness, weakness and headaches.  Psychiatric/Behavioral: Negative.  The patient is not nervous/anxious and does not have insomnia.     As per HPI. Otherwise, a complete review of systems  is negative.  PAST MEDICAL HISTORY: Past Medical History:  Diagnosis Date   Allergy 1998   Penicillin   Complication of anesthesia    GERD (gastroesophageal reflux disease) 2015   Only occasionally   Hyperlipidemia    Lung cancer, main bronchus, right (HCC)    Personal history of chemotherapy    Personal history of radiation therapy    PONV (postoperative nausea and vomiting)    Stroke (HCC) 2019   TIA (transient ischemic attack)     PAST SURGICAL HISTORY: Past Surgical History:  Procedure Laterality Date   BREAST CYST ASPIRATION Right 07/20/2012   FNA benign   BREAST CYST ASPIRATION Right    BREAST SURGERY Right 07/20/2012   FNA benign   BRONCHIAL NEEDLE ASPIRATION BIOPSY  06/07/2022   Procedure: BRONCHIAL NEEDLE ASPIRATION BIOPSIES;  Surgeon: Isadora Hose, MD;  Location: [facility];  Service: Pulmonary;;   CESAREAN SECTION     CHOLECYSTECTOMY     COLONOSCOPY WITH PROPOFOL  N/A 03/21/2021   Procedure: COLONOSCOPY WITH PROPOFOL ;  Surgeon: Unk Corinn Skiff, MD;  Location: [facility];  Service: Gastroenterology;  Laterality: N/A;   IR IMAGING GUIDED PORT INSERTION  06/21/2022   OVARY SURGERY     TUBAL LIGATION  1990   VIDEO BRONCHOSCOPY WITH ENDOBRONCHIAL ULTRASOUND N/A 06/07/2022   Procedure: VIDEO BRONCHOSCOPY WITH ENDOBRONCHIAL ULTRASOUND;  Surgeon: Isadora Hose, MD;  Location: [facility];  Service: Pulmonary;  Laterality: N/A;    FAMILY HISTORY: Family History  Problem Relation Age of Onset   Breast cancer Mother 33   Cancer Mother        breast   Cancer Father  bone cancer    ADVANCED DIRECTIVES (Y/N):  N  HEALTH MAINTENANCE: Social History   Tobacco Use   Smoking status: Former    Current packs/day: 0.00    Average packs/day: 0.6 packs/day for 43.0 years (26.7 ttl pk-yrs)    Types: Cigarettes    Start date: 70    Quit date: 2016    Years since quitting: 9.5   Smokeless tobacco: Never   Tobacco comments:    quit x 1 month 11/16   Vaping Use   Vaping status: Never Used  Substance Use Topics   Alcohol use: Yes    Comment: occasional : 1x/week   Drug use: No     Colonoscopy:  PAP:  Bone density:  Lipid panel:  Allergies  Allergen Reactions   Penicillins Hives    Current Outpatient Medications  Medication Sig Dispense Refill   aspirin EC 81 MG tablet Take 81 mg by mouth in the morning.     buPROPion  (WELLBUTRIN  XL) 150 MG 24 hr tablet TAKE 1 TABLET BY MOUTH IN THE MORNING. FOR MOOD AND FATIGUE. 90 tablet 1   calcium carbonate (OS-CAL) 600 MG TABS Take 600 mg by mouth every evening.     Coenzyme Q10 (CO Q 10 PO) Take 1 capsule by mouth in the morning.     Fluocinolone  Acetonide 0.01 % OIL Apply twice daily to ears as needed for rash/itching 20 mL 2   ibuprofen (ADVIL,MOTRIN) 200 MG tablet Take 400 mg by mouth every 8 (eight) hours as needed (pain.).     ketoconazole  (NIZORAL ) 2 % shampoo 2-3 times per week lather on scalp and ears, leave on 8-10 minutes, rinse well (Patient taking differently: Apply 1 Application topically once a week. Once per week lather on scalp and ears, leave on 8-10 minutes, rinse well) 120 mL 5   lidocaine -prilocaine  (EMLA ) cream Apply 1 Application topically as needed.     MAGNESIUM PO Take 1 tablet by mouth every evening.     OMEGA-3 FATTY ACIDS PO Take 1 g by mouth every evening.     simvastatin  (ZOCOR ) 40 MG tablet TAKE 1 TABLET BY MOUTH EVERY DAY 90 tablet 3   VITAMIN D  PO Take 1,000 Units by mouth in the morning.     No current facility-administered medications for this visit.    OBJECTIVE: Vitals:   08/13/23 0921  BP: 138/89  Pulse: 87  Resp: 16  Temp: (!) 97.2 F (36.2 C)  SpO2: 100%      Body mass index is 27.64 kg/m.    ECOG FS:0 - Asymptomatic  General: Well-developed, well-nourished, no acute distress. Eyes: Pink conjunctiva, anicteric sclera. HEENT: Normocephalic, moist mucous membranes. Lungs: No audible wheezing or coughing. Heart: Regular rate and  rhythm. Abdomen: Soft, nontender, no obvious distention. Musculoskeletal: No edema, cyanosis, or clubbing. Neuro: Alert, answering all questions appropriately. Cranial nerves grossly intact. Skin: No rashes or petechiae noted. Psych: Normal affect.  LAB RESULTS:  Lab Results  Component Value Date   NA 135 08/13/2023   K 3.7 08/13/2023   CL 104 08/13/2023   CO2 22 08/13/2023   GLUCOSE 151 (H) 08/13/2023   BUN 12 08/13/2023   CREATININE 0.93 08/13/2023   CALCIUM 8.9 08/13/2023   PROT 7.2 08/13/2023   ALBUMIN 3.9 08/13/2023   AST 32 08/13/2023   ALT 21 08/13/2023   ALKPHOS 79 08/13/2023   BILITOT 0.7 08/13/2023   GFRNONAA >60 08/13/2023   GFRAA >60 01/04/2018    Lab Results  Component  Value Date   WBC 4.9 08/13/2023   NEUTROABS 3.3 08/13/2023   HGB 12.7 08/13/2023   HCT 37.7 08/13/2023   MCV 88.9 08/13/2023   PLT 254 08/13/2023    STUDIES: No results found.  ASSESSMENT: Stage IIIb adenocarcinoma of the lung.  PLAN:    Stage IIIb adenocarcinoma of the lung: Biopsy from bronchoscopy on June 07, 2022 confirming the diagnosis.  PET scan results from May 22, 2022 reviewed independently confirming stage of disease.  The bilateral hypermetabolic cervical lymph nodes are suspicious, but will treat patient as a stage IIIb.  MRI of the brain on Jun 19, 2022 did not reveal any metastatic disease.  Patient completed weekly carboplatin  and Taxol  on July 31, 2022.  Her most recent imaging results from CT scan on May 25, 2023 reviewed independently with no obvious evidence of recurrent or progressive disease.  Proceed with treatment today.  Patient completed year-long maintenance durvalumab  on August 13, 2023.  No further intervention is needed.  Return to clinic in 3 months with repeat imaging and further evaluation.    Peripheral neuropathy: Chronic and unchanged. Adjustment/anxiety: Continue Wellbutrin  as prescribed.  I spent a total of 30 minutes reviewing chart data,  face-to-face evaluation with the patient, counseling and coordination of care as detailed above.    Patient expressed understanding and was in agreement with this plan. She also understands that She can call clinic at any time with any questions, concerns, or complaints.    Cancer Staging  Adenocarcinoma of right lung [facility]) Staging form: Lung, AJCC 8th Edition - Clinical stage from 06/12/2022: Stage IIIB (cT2b, cN3, cM0) - Signed by [name] [name], MD on 06/12/2022 Stage prefix: Initial diagnosis   [name] [name], MD   08/13/2023 12:39 PM

## 2023-08-13 NOTE — Patient Instructions (Signed)

## 2023-08-14 LAB — T4: T4, Total: 7.6 ug/dL (ref 4.5–12.0)

## 2023-10-04 ENCOUNTER — Ambulatory Visit
Admission: RE | Admit: 2023-10-04 | Discharge: 2023-10-04 | Disposition: A | Discharge status: Home / Self Care | Attending: Emergency Medicine | Admitting: Emergency Medicine

## 2023-10-04 VITALS — BP 94/67 | HR 78 | Temp 98.1°F | Resp 24

## 2023-10-04 DIAGNOSIS — R319 Hematuria, unspecified: Secondary | ICD-10-CM

## 2023-10-04 DIAGNOSIS — N390 Urinary tract infection, site not specified: Secondary | ICD-10-CM | POA: Diagnosis not present

## 2023-10-04 HISTORY — DX: Personal history of immunosuppression therapy: Z92.25

## 2023-10-04 LAB — POCT URINE DIPSTICK
Glucose, UA: NEGATIVE mg/dL
Ketones, POC UA: NEGATIVE mg/dL
Nitrite, UA: POSITIVE — AB
Protein Ur, POC: >=300 mg/dL — AB
Spec Grav, UA: 1.025 (ref 1.010–1.025)
Urobilinogen, UA: 0.2 U/dL
pH, UA: 6 (ref 5.0–8.0)

## 2023-10-04 MED ORDER — CEPHALEXIN 500 MG PO CAPS: 500.0000 mg | ORAL_CAPSULE | Freq: Three times a day (TID) | ORAL | 0 refills | Status: AC

## 2023-10-04 NOTE — ED Triage Notes (Signed)
 Patient to Urgent Care with complaints of dysuria/ urinary frequency/ bladder pressure/ chills and possible fevers.  Reports symptoms started 2 days ago.  Taking azo (last dose yesterday).

## 2023-10-04 NOTE — Discharge Instructions (Addendum)
 Take the antibiotic as directed.  The urine culture is pending.  We will call you if it shows the need to change or discontinue your antibiotic.    Follow up with your primary care provider if your symptoms are not improving.

## 2023-10-04 NOTE — ED Provider Notes (Signed)
 CAY RALPH PELT    CSN: [id number] Arrival date & time: 10/04/23  1407      History   Chief Complaint Chief Complaint  Patient presents with   URI    Pain when urinating and going more frequently - Entered by patient    HPI Connie West is a 68 y.o. female.  Patient presents with 2-day history of dysuria, urinary frequency, bladder pressure, chills, sweating.  She noted some hematuria yesterday.  She has been treating her symptoms with Azo; last taken yesterday.  She denies fever, abdominal pain, flank pain.  The history is provided by the patient and medical records.    Past Medical History:  Diagnosis Date   Allergy 1998   Penicillin   Complication of anesthesia    GERD (gastroesophageal reflux disease) 2015   Only occasionally   Hyperlipidemia    Lung cancer, main bronchus, right (HCC)    Personal history of chemotherapy    Personal history of immunosuppression therapy    Personal history of radiation therapy    PONV (postoperative nausea and vomiting)    Stroke (HCC) 2019   TIA (transient ischemic attack)     Patient Active Problem List   Diagnosis Date Noted   Newly recognized heart murmur 02/20/2023   Adenocarcinoma of right lung (HCC) 06/12/2022   History of smoking 30 or more pack years 02/01/2022   Closed Colles' fracture 11/02/2021   Cecal polyp    Polyp of descending colon    Varicose veins of leg with pain, right 06/04/2019   Varicose veins of leg with pain, left 05/21/2019   History of cerebrovascular accident (CVA) involving cerebellum 01/20/2018   Vitamin D  deficiency 09/23/2014   Prediabetes 08/24/2013   Osteopenia 01/27/2009   Diverticulosis of colon 05/18/2008   Pure hypercholesterolemia 12/25/2007    Past Surgical History:  Procedure Laterality Date   BREAST CYST ASPIRATION Right 07/20/2012   FNA benign   BREAST CYST ASPIRATION Right    BREAST SURGERY Right 07/20/2012   FNA benign   BRONCHIAL NEEDLE ASPIRATION BIOPSY   06/07/2022   Procedure: BRONCHIAL NEEDLE ASPIRATION BIOPSIES;  Surgeon: Isadora Hose, MD;  Location: [facility];  Service: Pulmonary;;   CESAREAN SECTION     CHOLECYSTECTOMY     COLONOSCOPY WITH PROPOFOL  N/A 03/21/2021   Procedure: COLONOSCOPY WITH PROPOFOL ;  Surgeon: Unk Corinn Skiff, MD;  Location: [facility];  Service: Gastroenterology;  Laterality: N/A;   IR IMAGING GUIDED PORT INSERTION  06/21/2022   OVARY SURGERY     TUBAL LIGATION  1990   VIDEO BRONCHOSCOPY WITH ENDOBRONCHIAL ULTRASOUND N/A 06/07/2022   Procedure: VIDEO BRONCHOSCOPY WITH ENDOBRONCHIAL ULTRASOUND;  Surgeon: Isadora Hose, MD;  Location: [facility];  Service: Pulmonary;  Laterality: N/A;    OB History     Gravida  2   Para  2   Term      Preterm      AB      Living  2      SAB      IAB      Ectopic      Multiple      Live Births           Obstetric Comments  1st Menstrual Cycle:  14 1st Pregnancy:  26          Home Medications    Prior to Admission medications   Medication Sig Start Date End Date Taking? Authorizing Provider  cephALEXin  (KEFLEX ) 500 MG capsule Take 1  capsule (500 mg total) by mouth 3 (three) times daily for 5 days. 10/04/23 10/09/23 Yes Corlis Burnard DEL, NP  aspirin EC 81 MG tablet Take 81 mg by mouth in the morning.    [provider]  buPROPion  (WELLBUTRIN  XL) 150 MG 24 hr tablet TAKE 1 TABLET BY MOUTH IN THE MORNING. FOR MOOD AND FATIGUE. 07/08/23   Dasie Tinnie MATSU, NP  calcium carbonate (OS-CAL) 600 MG TABS Take 600 mg by mouth every evening.    [provider]  Coenzyme Q10 (CO Q 10 PO) Take 1 capsule by mouth in the morning.    [provider]  Fluocinolone  Acetonide 0.01 % OIL Apply twice daily to ears as needed for rash/itching 05/02/22   Stewart, Tara, MD  ibuprofen (ADVIL,MOTRIN) 200 MG tablet Take 400 mg by mouth every 8 (eight) hours as needed (pain.).    [provider]  ketoconazole  (NIZORAL ) 2 % shampoo 2-3  times per week lather on scalp and ears, leave on 8-10 minutes, rinse well Patient taking differently: Apply 1 Application topically once a week. Once per week lather on scalp and ears, leave on 8-10 minutes, rinse well 05/02/22   Jackquline Sawyer, MD  lidocaine -prilocaine  (EMLA ) cream Apply 1 Application topically as needed. 07/31/22   [provider]  MAGNESIUM PO Take 1 tablet by mouth every evening.    [provider]  OMEGA-3 FATTY ACIDS PO Take 1 g by mouth every evening.    [provider]  simvastatin  (ZOCOR ) 40 MG tablet TAKE 1 TABLET BY MOUTH EVERY DAY 04/09/23   Bedsole, Amy E, MD  VITAMIN D  PO Take 1,000 Units by mouth in the morning.    [provider]    Family History Family History  Problem Relation Age of Onset   Breast cancer Mother 49   Cancer Mother        breast   Cancer Father        bone cancer    Social History Social History   Tobacco Use   Smoking status: Former    Current packs/day: 0.00    Average packs/day: 0.6 packs/day for 43.0 years (26.7 ttl pk-yrs)    Types: Cigarettes    Start date: 17    Quit date: 2016    Years since quitting: 9.6   Smokeless tobacco: Never   Tobacco comments:    quit x 1 month 11/16  Vaping Use   Vaping status: Never Used  Substance Use Topics   Alcohol use: Yes    Comment: occasional : 1x/week   Drug use: No     Allergies   Penicillins   Review of Systems Review of Systems  Constitutional:  Positive for chills. Negative for fever.  Gastrointestinal:  Negative for abdominal pain.  Genitourinary:  Positive for dysuria, frequency and hematuria. Negative for flank pain.     Physical Exam Triage Vital Signs ED Triage Vitals  Encounter Vitals Group     BP      Girls Systolic BP Percentile      Girls Diastolic BP Percentile      Boys Systolic BP Percentile      Boys Diastolic BP Percentile      Pulse      Resp      Temp      Temp src      SpO2      Weight      Height       Head Circumference  Peak Flow      Pain Score      Pain Loc      Pain Education      Exclude from Growth Chart    No data found.  Updated Vital Signs BP 94/67   Pulse 78   Temp 98.1 F (36.7 C)   Resp (!) 24   SpO2 96%   Visual Acuity Right Eye Distance:   Left Eye Distance:   Bilateral Distance:    Right Eye Near:   Left Eye Near:    Bilateral Near:     Physical Exam Constitutional:      General: She is not in acute distress. HENT:     Mouth/Throat:     Mouth: Mucous membranes are moist.  Cardiovascular:     Rate and Rhythm: Normal rate and regular rhythm.  Pulmonary:     Effort: Pulmonary effort is normal. No respiratory distress.  Abdominal:     General: Bowel sounds are normal.     Palpations: Abdomen is soft.     Tenderness: There is no abdominal tenderness. There is no right CVA tenderness, left CVA tenderness, guarding or rebound.  Neurological:     Mental Status: She is alert.      UC Treatments / Results  Labs (all labs ordered are listed, but only abnormal results are displayed) Labs Reviewed  POCT URINE DIPSTICK - Abnormal; Notable for the following components:      Result Value   Color, UA straw (*)    Clarity, UA cloudy (*)    Bilirubin, UA small (*)    Blood, UA large (*)    Protein Ur, POC >=300 (*)    Nitrite, UA Positive (*)    Leukocytes, UA Large (3+) (*)    All other components within normal limits    EKG   Radiology No results found.  Procedures Procedures (including critical care time)  Medications Ordered in UC Medications - No data to display  Initial Impression / Assessment and Plan / UC Course  I have reviewed the triage vital signs and the nursing notes.  Pertinent labs & imaging results that were available during my care of the patient were reviewed by me and considered in my medical decision making (see chart for details).    UTI with hematuria.  Afebrile and vital signs are stable.  Treating with  Keflex . Patient unable to provide enough urine for a culture today.  She is drinking water without difficulty and  urine specific gravity is 1.025.  Instructed her to follow-up with her PCP if her symptoms are not improving. Patient agrees to plan of care.     Final Clinical Impressions(s) / UC Diagnoses   Final diagnoses:  Urinary tract infection with hematuria, site unspecified     Discharge Instructions      Take the antibiotic as directed.  The urine culture is pending.  We will call you if it shows the need to change or discontinue your antibiotic.    Follow-up with your primary care provider if your symptoms are not improving.      ED Prescriptions     Medication Sig Dispense Auth. Provider   cephALEXin  (KEFLEX ) 500 MG capsule Take 1 capsule (500 mg total) by mouth 3 (three) times daily for 5 days. 15 capsule Corlis Burnard DEL, NP      PDMP not reviewed this encounter.   Corlis Burnard DEL, NP 10/04/23 [phone]

## 2023-10-23 ENCOUNTER — Encounter: Payer: Self-pay | Admitting: Family Medicine

## 2023-10-23 ENCOUNTER — Ambulatory Visit: Admitting: Family Medicine

## 2023-10-23 VITALS — BP 118/80 | HR 99 | Temp 98.0°F | Resp 20 | Ht 64.0 in | Wt 159.2 lb

## 2023-10-23 DIAGNOSIS — N3001 Acute cystitis with hematuria: Secondary | ICD-10-CM | POA: Diagnosis not present

## 2023-10-23 DIAGNOSIS — R300 Dysuria: Secondary | ICD-10-CM | POA: Diagnosis not present

## 2023-10-23 LAB — POC URINALSYSI DIPSTICK (AUTOMATED)
Bilirubin, UA: NEGATIVE
Glucose, UA: NEGATIVE
Ketones, UA: NEGATIVE
Nitrite, UA: NEGATIVE
Protein, UA: NEGATIVE
Spec Grav, UA: 1.01 (ref 1.010–1.025)
Urobilinogen, UA: 0.2 U/dL
pH, UA: 6 (ref 5.0–8.0)

## 2023-10-23 MED ORDER — SULFAMETHOXAZOLE-TRIMETHOPRIM 800-160 MG PO TABS: 1.0000 | ORAL_TABLET | Freq: Two times a day (BID) | ORAL | 0 refills | Status: DC

## 2023-10-23 NOTE — Progress Notes (Signed)
 Patient ID: Connie West, female    DOB: 28-Oct-1955, 68 y.o.   MRN: [id number]  This visit was conducted in person.  BP 118/80   Pulse 99   Temp 98 F (36.7 C)   Resp 20   Ht 5' 4 (1.626 m)   Wt 159 lb 4 oz (72.2 kg)   SpO2 100%   BMI 27.34 kg/m    CC:  Chief Complaint  Patient presents with   Urinary Tract Infection    Subjective:   HPI: Connie West is a 68 y.o. female presenting on 10/23/2023 for Urinary Tract Infection    3 weeks ago.. seen at urgent care  UA was positive but did not have enough for culture... treated with cephelexin 500 mg TID x 5 days.  Symptoms never went away completely Urinary Tract Infection  This is a recurrent problem. The current episode started 1 to 4 weeks ago. The problem occurs every urination. The patient is experiencing no pain. There has been no fever. She is Not sexually active. There is No history of pyelonephritis. Associated symptoms include frequency and urgency. Pertinent negatives include no chills, discharge, flank pain, hematuria, hesitancy, nausea or vomiting. Associated symptoms comments:  Cloudy urine, urine odor.  Abdominal cramping with urination.. She has tried antibiotics and increased fluids for the symptoms. The treatment provided moderate relief. There is no history of catheterization, kidney stones, recurrent UTIs, a single kidney, urinary stasis or a urological procedure.     Last positive Urine culture Ecoli:  only resistant to ampicillin.     She is going to [place] on Sunday.  Relevant past medical, surgical, family and social history reviewed and updated as indicated. Interim medical history since our last visit reviewed. Allergies and medications reviewed and updated. Outpatient Medications Prior to Visit  Medication Sig Dispense Refill   aspirin EC 81 MG tablet Take 81 mg by mouth in the morning.     buPROPion  (WELLBUTRIN  XL) 150 MG 24 hr tablet TAKE 1 TABLET BY MOUTH IN THE MORNING. FOR MOOD AND FATIGUE. 90  tablet 1   calcium carbonate (OS-CAL) 600 MG TABS Take 600 mg by mouth every evening.     Coenzyme Q10 (CO Q 10 PO) Take 1 capsule by mouth in the morning.     Fluocinolone  Acetonide 0.01 % OIL Apply twice daily to ears as needed for rash/itching 20 mL 2   ibuprofen (ADVIL,MOTRIN) 200 MG tablet Take 400 mg by mouth every 8 (eight) hours as needed (pain.).     ketoconazole  (NIZORAL ) 2 % shampoo 2-3 times per week lather on scalp and ears, leave on 8-10 minutes, rinse well (Patient taking differently: Apply 1 Application topically once a week. Once per week lather on scalp and ears, leave on 8-10 minutes, rinse well) 120 mL 5   lidocaine -prilocaine  (EMLA ) cream Apply 1 Application topically as needed.     MAGNESIUM PO Take 1 tablet by mouth every evening.     OMEGA-3 FATTY ACIDS PO Take 1 g by mouth every evening.     simvastatin  (ZOCOR ) 40 MG tablet TAKE 1 TABLET BY MOUTH EVERY DAY 90 tablet 3   VITAMIN D  PO Take 1,000 Units by mouth in the morning.     No facility-administered medications prior to visit.     Per HPI unless specifically indicated in ROS section below Review of Systems  Constitutional:  Negative for chills.  Gastrointestinal:  Negative for nausea and vomiting.  Genitourinary:  Positive for  frequency and urgency. Negative for flank pain, hematuria and hesitancy.   Objective:  BP 118/80   Pulse 99   Temp 98 F (36.7 C)   Resp 20   Ht 5' 4 (1.626 m)   Wt 159 lb 4 oz (72.2 kg)   SpO2 100%   BMI 27.34 kg/m   Wt Readings from Last 3 Encounters:  10/23/23 159 lb 4 oz (72.2 kg)  08/13/23 161 lb (73 kg)  07/30/23 160 lb 3.2 oz (72.7 kg)      Physical Exam Constitutional:      General: She is not in acute distress.    Appearance: Normal appearance. She is well-developed. She is not ill-appearing or toxic-appearing.  HENT:     Head: Normocephalic.     Right Ear: Hearing, tympanic membrane, ear canal and external ear normal. Tympanic membrane is not erythematous,  retracted or bulging.     Left Ear: Hearing, tympanic membrane, ear canal and external ear normal. Tympanic membrane is not erythematous, retracted or bulging.     Nose: No mucosal edema or rhinorrhea.     Right Sinus: No maxillary sinus tenderness or frontal sinus tenderness.     Left Sinus: No maxillary sinus tenderness or frontal sinus tenderness.     Mouth/Throat:     Pharynx: Uvula midline.  Eyes:     General: Lids are normal. Lids are everted, no foreign bodies appreciated.     Conjunctiva/sclera: Conjunctivae normal.     Pupils: Pupils are equal, round, and reactive to light.  Neck:     Thyroid : No thyroid  mass or thyromegaly.     Vascular: No carotid bruit.     Trachea: Trachea normal.  Cardiovascular:     Rate and Rhythm: Normal rate and regular rhythm.     Pulses: Normal pulses.     Heart sounds: Normal heart sounds, S1 normal and S2 normal. No murmur heard.    No friction rub. No gallop.  Pulmonary:     Effort: Pulmonary effort is normal. No tachypnea or respiratory distress.     Breath sounds: Normal breath sounds. No decreased breath sounds, wheezing, rhonchi or rales.  Abdominal:     General: Bowel sounds are normal.     Palpations: Abdomen is soft.     Tenderness: There is no abdominal tenderness.  Musculoskeletal:     Cervical back: Normal range of motion and neck supple.  Skin:    General: Skin is warm and dry.     Findings: No rash.  Neurological:     Mental Status: She is alert.  Psychiatric:        Mood and Affect: Mood is not anxious or depressed.        Speech: Speech normal.        Behavior: Behavior normal. Behavior is cooperative.        Thought Content: Thought content normal.        Judgment: Judgment normal.       Results for orders placed or performed in visit on 10/23/23  POCT Urinalysis Dipstick (Automated)   Collection Time: 10/23/23 10:34 AM  Result Value Ref Range   Color, UA Yellow    Clarity, UA Slightly cloudy    Glucose, UA  Negative Negative   Bilirubin, UA Negative    Ketones, UA Negative    Spec Grav, UA 1.010 1.010 - 1.025   Blood, UA Trace    pH, UA 6.0 5.0 - 8.0   Protein, UA Negative Negative  Urobilinogen, UA 0.2 0.2 or 1.0 E.U./dL   Nitrite, UA Negative    Leukocytes, UA Large (3+) (A) Negative    Assessment and Plan  Dysuria -     POCT Urinalysis Dipstick (Automated) -     Urine Culture  Acute cystitis with hematuria Assessment & Plan: Acute, likely persistent UTI, partially treated. Urinalysis concerning given continued leukocytes and trace hematuria. Encouraged patient to push fluids, avoid bladder irritants and start cranberry. Will treat empirically with Bactrim  double strength 1 tablet twice a day for 3 days.  Will send urine for culture to identify bacteria.  Of note patient going to [place] on Sunday.  Communicate with her via MyChart.  Return and ER precautions provided.   Other orders -     Sulfamethoxazole -Trimethoprim ; Take 1 tablet by mouth 2 (two) times daily.  Dispense: 6 tablet; Refill: 0    No follow-ups on file.   Greig Ring, MD

## 2023-10-23 NOTE — Assessment & Plan Note (Addendum)
 Acute, likely persistent UTI, partially treated. Urinalysis concerning given continued leukocytes and trace hematuria. Encouraged patient to push fluids, avoid bladder irritants and start cranberry. Will treat empirically with Bactrim  double strength 1 tablet twice a day for 3 days.  Will send urine for culture to identify bacteria.  Of note patient going to [place] on Sunday.  Communicate with her via MyChart.  Return and ER precautions provided.

## 2023-10-24 LAB — URINE CULTURE
MICRO NUMBER:: 16955298
SPECIMEN QUALITY:: ADEQUATE

## 2023-10-28 ENCOUNTER — Ambulatory Visit: Payer: Self-pay | Admitting: Family Medicine

## 2023-10-29 ENCOUNTER — Ambulatory Visit: Admitting: Dermatology

## 2023-11-13 ENCOUNTER — Other Ambulatory Visit: Payer: Self-pay | Admitting: *Deleted

## 2023-11-13 ENCOUNTER — Inpatient Hospital Stay: Attending: Oncology

## 2023-11-13 ENCOUNTER — Ambulatory Visit
Admission: RE | Admit: 2023-11-13 | Discharge: 2023-11-13 | Disposition: A | Discharge status: Home / Self Care | Source: Ambulatory Visit | Attending: Oncology | Admitting: Oncology

## 2023-11-13 DIAGNOSIS — C3491 Malignant neoplasm of unspecified part of right bronchus or lung: Secondary | ICD-10-CM | POA: Insufficient documentation

## 2023-11-13 DIAGNOSIS — I700 Atherosclerosis of aorta: Secondary | ICD-10-CM | POA: Diagnosis not present

## 2023-11-13 DIAGNOSIS — J432 Centrilobular emphysema: Secondary | ICD-10-CM | POA: Diagnosis not present

## 2023-11-13 DIAGNOSIS — G629 Polyneuropathy, unspecified: Secondary | ICD-10-CM | POA: Insufficient documentation

## 2023-11-13 DIAGNOSIS — Z79899 Other long term (current) drug therapy: Secondary | ICD-10-CM | POA: Insufficient documentation

## 2023-11-13 DIAGNOSIS — J90 Pleural effusion, not elsewhere classified: Secondary | ICD-10-CM | POA: Diagnosis not present

## 2023-11-13 DIAGNOSIS — Z87891 Personal history of nicotine dependence: Secondary | ICD-10-CM | POA: Insufficient documentation

## 2023-11-13 MED ORDER — HEPARIN SOD (PORK) LOCK FLUSH 100 UNIT/ML IV SOLN
INTRAVENOUS | Status: AC
  Filled 2023-11-13: qty 5

## 2023-11-13 MED ORDER — IOHEXOL 300 MG/ML  SOLN
75.0000 mL | Freq: Once | INTRAMUSCULAR | Status: AC | PRN
  Administered 2023-11-13: 100 mL via INTRAVENOUS

## 2023-11-13 MED ORDER — HEPARIN SOD (PORK) LOCK FLUSH 100 UNIT/ML IV SOLN
500.0000 [IU] | Freq: Once | INTRAVENOUS | Status: AC
  Administered 2023-11-13: 500 [IU] via INTRAVENOUS
  Filled 2023-11-13: qty 5

## 2023-11-19 ENCOUNTER — Other Ambulatory Visit: Payer: Self-pay | Admitting: *Deleted

## 2023-11-19 ENCOUNTER — Ambulatory Visit: Admitting: Dermatology

## 2023-11-19 DIAGNOSIS — B079 Viral wart, unspecified: Secondary | ICD-10-CM | POA: Diagnosis not present

## 2023-11-19 DIAGNOSIS — L814 Other melanin hyperpigmentation: Secondary | ICD-10-CM | POA: Diagnosis not present

## 2023-11-19 DIAGNOSIS — C3491 Malignant neoplasm of unspecified part of right bronchus or lung: Secondary | ICD-10-CM

## 2023-11-19 DIAGNOSIS — D485 Neoplasm of uncertain behavior of skin: Secondary | ICD-10-CM

## 2023-11-19 DIAGNOSIS — B078 Other viral warts: Secondary | ICD-10-CM | POA: Diagnosis not present

## 2023-11-19 NOTE — Progress Notes (Signed)
   Follow-Up Visit   Subjective  Connie West is a 68 y.o. female who presents for the following: Growth on the left lower thigh/superior knee, present over 1 year, itchy at times.    The following portions of the chart were reviewed this encounter and updated as appropriate: medications, allergies, medical history  Review of Systems:  No other skin or systemic complaints except as noted in HPI or Assessment and Plan.  Objective  Well appearing patient in no apparent distress; mood and affect are within normal limits.  A focused examination was performed of the following areas: Left leg  Relevant physical exam findings are noted in the Assessment and Plan.  Left Upper Knee 0.6 cm hyperkeratotic papule   Assessment & Plan   NEOPLASM OF UNCERTAIN BEHAVIOR OF SKIN Left Upper Knee Epidermal / dermal shaving  Lesion diameter (cm):  0.6 Informed consent: discussed and consent obtained   Patient was prepped and draped in usual sterile fashion: Area prepped with alcohol. Anesthesia: the lesion was anesthetized in a standard fashion   Anesthetic:  1% lidocaine  w/ epinephrine  1-100,000 buffered w/ 8.4% NaHCO3 Instrument used: flexible razor blade   Hemostasis achieved with: pressure, aluminum chloride and electrodesiccation   Outcome: patient tolerated procedure well    Destruction of lesion  Destruction method: electrodesiccation and curettage   Informed consent: discussed and consent obtained   Curettage performed in three different directions: Yes   Electrodesiccation performed over the curetted area: Yes   Final wound size (cm):  0.6 Hemostasis achieved with:  pressure, aluminum chloride and electrodesiccation Outcome: patient tolerated procedure well with no complications   Post-procedure details: wound care instructions given   Post-procedure details comment:  Ointment and bandage applied.  Specimen 1 - Surgical pathology Differential Diagnosis: Hypertrophic AK vs Wart   Check Margins: No  LENTIGINES Exam: scattered tan macules legs Due to sun exposure Treatment Plan: Benign-appearing, observe. Recommend daily broad spectrum sunscreen SPF 30+ to sun-exposed areas, reapply every 2 hours as needed.  Call for any changes   Return if symptoms worsen or fail to improve.  IAndrea Kerns, CMA, am acting as scribe for Rexene Rattler, MD .   Documentation: I have reviewed the above documentation for accuracy and completeness, and I agree with the above.  Rexene Rattler, MD

## 2023-11-19 NOTE — Patient Instructions (Addendum)

## 2023-11-20 ENCOUNTER — Inpatient Hospital Stay (HOSPITAL_BASED_OUTPATIENT_CLINIC_OR_DEPARTMENT_OTHER): Admitting: Oncology

## 2023-11-20 ENCOUNTER — Encounter: Payer: Self-pay | Admitting: Oncology

## 2023-11-20 ENCOUNTER — Inpatient Hospital Stay

## 2023-11-20 VITALS — BP 116/66 | HR 84 | Temp 99.0°F | Resp 18 | Ht 64.0 in | Wt 162.0 lb

## 2023-11-20 DIAGNOSIS — C3491 Malignant neoplasm of unspecified part of right bronchus or lung: Secondary | ICD-10-CM

## 2023-11-20 DIAGNOSIS — R0602 Shortness of breath: Secondary | ICD-10-CM

## 2023-11-20 DIAGNOSIS — G629 Polyneuropathy, unspecified: Secondary | ICD-10-CM | POA: Diagnosis not present

## 2023-11-20 DIAGNOSIS — Z79899 Other long term (current) drug therapy: Secondary | ICD-10-CM | POA: Diagnosis not present

## 2023-11-20 DIAGNOSIS — Z87891 Personal history of nicotine dependence: Secondary | ICD-10-CM | POA: Diagnosis not present

## 2023-11-20 LAB — CBC WITH DIFFERENTIAL/PLATELET
Abs Immature Granulocytes: 0.02 K/uL (ref 0.00–0.07)
Basophils Absolute: 0 K/uL (ref 0.0–0.1)
Basophils Relative: 1 %
Eosinophils Absolute: 0.1 K/uL (ref 0.0–0.5)
Eosinophils Relative: 2 %
HCT: 38.4 % (ref 36.0–46.0)
Hemoglobin: 12.7 g/dL (ref 12.0–15.0)
Immature Granulocytes: 0 %
Lymphocytes Relative: 24 %
Lymphs Abs: 1.2 K/uL (ref 0.7–4.0)
MCH: 29.6 pg (ref 26.0–34.0)
MCHC: 33.1 g/dL (ref 30.0–36.0)
MCV: 89.5 fL (ref 80.0–100.0)
Monocytes Absolute: 0.4 K/uL (ref 0.1–1.0)
Monocytes Relative: 9 %
Neutro Abs: 3.1 K/uL (ref 1.7–7.7)
Neutrophils Relative %: 64 %
Platelets: 331 K/uL (ref 150–400)
RBC: 4.29 MIL/uL (ref 3.87–5.11)
RDW: 13 % (ref 11.5–15.5)
WBC: 4.8 K/uL (ref 4.0–10.5)
nRBC: 0 % (ref 0.0–0.2)

## 2023-11-20 LAB — CMP (CANCER CENTER ONLY)
ALT: 15 U/L (ref 0–44)
AST: 22 U/L (ref 15–41)
Albumin: 3.9 g/dL (ref 3.5–5.0)
Alkaline Phosphatase: 71 U/L (ref 38–126)
Anion gap: 9 (ref 5–15)
BUN: 19 mg/dL (ref 8–23)
CO2: 25 mmol/L (ref 22–32)
Calcium: 9.4 mg/dL (ref 8.9–10.3)
Chloride: 104 mmol/L (ref 98–111)
Creatinine: 1.08 mg/dL — ABNORMAL HIGH (ref 0.44–1.00)
GFR, Estimated: 56 mL/min — ABNORMAL LOW (ref >60)
Glucose, Bld: 86 mg/dL (ref 70–99)
Potassium: 4 mmol/L (ref 3.5–5.1)
Sodium: 138 mmol/L (ref 135–145)
Total Bilirubin: 0.5 mg/dL (ref 0.0–1.2)
Total Protein: 7.4 g/dL (ref 6.5–8.1)

## 2023-11-20 NOTE — Progress Notes (Unsigned)
 Patient just got back from [place], she said while she was there she was having some shortness of breath. She told me that since being back she hasn't had it that bad. She had a CT scan on 11/13/2023.

## 2023-11-20 NOTE — Progress Notes (Unsigned)
 [facility]  [phone]) [phone] [phone]  ID: Connie West OB: 01/24/1956  MR#: [id number]  RDW#:[id number]  Patient Care Team: [name] BRAVO, MD as PCP - General [name], RN as Oncology Nurse Navigator [name], [name], MD as Consulting Physician (Oncology) Pa, [name]  CHIEF COMPLAINT: Stage IIIb adenocarcinoma of the lung.  INTERVAL HISTORY: Patient returns to clinic today for routine 26-month evaluation and discussion of her imaging results.  She noticed during her trip to [place] that she had increased shortness of breath and dyspnea on exertion.  She otherwise feels well.  She does not complain of any weakness or fatigue.  Her peripheral neuropathy is unchanged.  She has no other neurologic complaints.  She denies any recent fevers or illnesses.  She has a good appetite and denies weight loss.  She has no chest pain, shortness of breath, cough, or hemoptysis.  She denies any nausea, vomiting, constipation, or diarrhea.  She has no urinary complaints.  Patient offers no further specific complaints today.  REVIEW OF SYSTEMS:   Review of Systems  Constitutional: Negative.  Negative for fever, malaise/fatigue and weight loss.  Respiratory:  Positive for shortness of breath. Negative for cough and hemoptysis.   Cardiovascular: Negative.  Negative for chest pain and leg swelling.  Gastrointestinal: Negative.  Negative for abdominal pain.  Genitourinary: Negative.  Negative for dysuria, frequency and urgency.  Musculoskeletal: Negative.  Negative for back pain and joint pain.  Skin: Negative.  Negative for rash.  Neurological:  Positive for tingling and sensory change. Negative for dizziness, focal weakness, weakness and headaches.  Psychiatric/Behavioral: Negative.  The patient is not nervous/anxious and does not have insomnia.     As per HPI. Otherwise, a complete review of systems is negative.  PAST MEDICAL HISTORY: Past  Medical History:  Diagnosis Date   Allergy 1998   Penicillin   Complication of anesthesia    GERD (gastroesophageal reflux disease) 2015   Only occasionally   Hyperlipidemia    Lung cancer, main bronchus, right (HCC)    Personal history of chemotherapy    Personal history of immunosuppression therapy    Personal history of radiation therapy    PONV (postoperative nausea and vomiting)    Stroke (HCC) 2019   TIA (transient ischemic attack)     PAST SURGICAL HISTORY: Past Surgical History:  Procedure Laterality Date   BREAST CYST ASPIRATION Right 07/20/2012   FNA benign   BREAST CYST ASPIRATION Right    BREAST SURGERY Right 07/20/2012   FNA benign   BRONCHIAL NEEDLE ASPIRATION BIOPSY  06/07/2022   Procedure: BRONCHIAL NEEDLE ASPIRATION BIOPSIES;  Surgeon: Isadora Hose, MD;  Location: [facility];  Service: Pulmonary;;   CESAREAN SECTION     CHOLECYSTECTOMY     COLONOSCOPY WITH PROPOFOL  N/A 03/21/2021   Procedure: COLONOSCOPY WITH PROPOFOL ;  Surgeon: Unk Corinn Skiff, MD;  Location: [facility] ENDOSCOPY;  Service: Gastroenterology;  Laterality: N/A;   IR IMAGING GUIDED PORT INSERTION  06/21/2022   OVARY SURGERY     TUBAL LIGATION  1990   VIDEO BRONCHOSCOPY WITH ENDOBRONCHIAL ULTRASOUND N/A 06/07/2022   Procedure: VIDEO BRONCHOSCOPY WITH ENDOBRONCHIAL ULTRASOUND;  Surgeon: Isadora Hose, MD;  Location: [facility] ENDOSCOPY;  Service: Pulmonary;  Laterality: N/A;    FAMILY HISTORY: Family History  Problem Relation Age of Onset   Breast cancer Mother 54   Cancer Mother        breast   Cancer Father  bone cancer    ADVANCED DIRECTIVES (Y/N):  N  HEALTH MAINTENANCE: Social History   Tobacco Use   Smoking status: Former    Current packs/day: 0.00    Average packs/day: 0.6 packs/day for 43.0 years (26.7 ttl pk-yrs)    Types: Cigarettes    Start date: 67    Quit date: 2016    Years since quitting: 9.7   Smokeless tobacco: Never   Tobacco comments:    quit x 1 month  11/16  Vaping Use   Vaping status: Never Used  Substance Use Topics   Alcohol use: Yes    Comment: occasional : 1x/week   Drug use: No     Colonoscopy:  PAP:  Bone density:  Lipid panel:  Allergies  Allergen Reactions   Penicillins Hives    Current Outpatient Medications  Medication Sig Dispense Refill   aspirin EC 81 MG tablet Take 81 mg by mouth in the morning.     buPROPion  (WELLBUTRIN  XL) 150 MG 24 hr tablet TAKE 1 TABLET BY MOUTH IN THE MORNING. FOR MOOD AND FATIGUE. 90 tablet 1   calcium carbonate (OS-CAL) 600 MG TABS Take 600 mg by mouth every evening.     Coenzyme Q10 (CO Q 10 PO) Take 1 capsule by mouth in the morning.     Fluocinolone  Acetonide 0.01 % OIL Apply twice daily to ears as needed for rash/itching 20 mL 2   ibuprofen (ADVIL,MOTRIN) 200 MG tablet Take 400 mg by mouth every 8 (eight) hours as needed (pain.).     ketoconazole  (NIZORAL ) 2 % shampoo 2-3 times per week lather on scalp and ears, leave on 8-10 minutes, rinse well (Patient taking differently: Apply 1 Application topically once a week. Once per week lather on scalp and ears, leave on 8-10 minutes, rinse well) 120 mL 5   lidocaine -prilocaine  (EMLA ) cream Apply 1 Application topically as needed.     MAGNESIUM PO Take 1 tablet by mouth every evening.     OMEGA-3 FATTY ACIDS PO Take 1 g by mouth every evening.     simvastatin  (ZOCOR ) 40 MG tablet TAKE 1 TABLET BY MOUTH EVERY DAY 90 tablet 3   sulfamethoxazole -trimethoprim  (BACTRIM  DS) 800-160 MG tablet Take 1 tablet by mouth 2 (two) times daily. 6 tablet 0   VITAMIN D  PO Take 1,000 Units by mouth in the morning.     No current facility-administered medications for this visit.    OBJECTIVE: Vitals:   11/20/23 1025  BP: 116/66  Pulse: 84  Resp: 18  Temp: 99 F (37.2 C)  SpO2: 100%      Body mass index is 27.81 kg/m.    ECOG FS:0 - Asymptomatic  General: Well-developed, well-nourished, no acute distress. Eyes: Pink conjunctiva, anicteric  sclera. HEENT: Normocephalic, moist mucous membranes. Lungs: No audible wheezing or coughing. Heart: Regular rate and rhythm. Abdomen: Soft, nontender, no obvious distention. Musculoskeletal: No edema, cyanosis, or clubbing. Neuro: Alert, answering all questions appropriately. Cranial nerves grossly intact. Skin: No rashes or petechiae noted. Psych: Normal affect.  LAB RESULTS:  Lab Results  Component Value Date   NA 138 11/20/2023   K 4.0 11/20/2023   CL 104 11/20/2023   CO2 25 11/20/2023   GLUCOSE 86 11/20/2023   BUN 19 11/20/2023   CREATININE 1.08 (H) 11/20/2023   CALCIUM 9.4 11/20/2023   PROT 7.4 11/20/2023   ALBUMIN 3.9 11/20/2023   AST 22 11/20/2023   ALT 15 11/20/2023   ALKPHOS 71 11/20/2023   BILITOT  0.5 11/20/2023   GFRNONAA 56 (L) 11/20/2023   GFRAA >60 01/04/2018    Lab Results  Component Value Date   WBC 4.8 11/20/2023   NEUTROABS 3.1 11/20/2023   HGB 12.7 11/20/2023   HCT 38.4 11/20/2023   MCV 89.5 11/20/2023   PLT 331 11/20/2023    STUDIES: CT CHEST W CONTRAST Result Date: 11/14/2023 CLINICAL DATA:  Adenocarcinoma of the right lung. Restaging. * Tracking Code: BO * EXAM: CT CHEST WITH CONTRAST TECHNIQUE: Multidetector CT imaging of the chest was performed during intravenous contrast administration. RADIATION DOSE REDUCTION: This exam was performed according to the departmental dose-optimization program which includes automated exposure control, adjustment of the mA and/or kV according to patient size and/or use of iterative reconstruction technique. CONTRAST:  100mL OMNIPAQUE IOHEXOL 300 MG/ML  SOLN COMPARISON:  05/12/2023 FINDINGS: Cardiovascular: The heart size is normal. No substantial pericardial effusion. Mild atherosclerotic calcification is noted in the wall of the thoracic aorta. Left Port-A-Cath tip is positioned in the mid to distal SVC. Mediastinum/Nodes: No mediastinal lymphadenopathy. There is no hilar lymphadenopathy. There is no axillary  lymphadenopathy. Lungs/Pleura: Centrilobular emphsyema noted. Architectural distortion and scarring in the parahilar right lung is similar to prior. Nodular component measured previously adjacent to the upper mediastinum of the right lung on 51/4 is 2.8 x 2.0 cm today compared to 2.8 x 2.3 cm previously. Small right pleural effusion seen previously has decreased in the interval. Scattered tiny bilateral pulmonary nodules evident measuring in the 2-3 mm size range, most of which were present previously and are unchanged (see anterolateral subpleural right lung on 49/4 as examples). No new focal airspace consolidation. No pneumothorax. Upper Abdomen: Visualized portion of the upper abdomen shows no acute findings. Musculoskeletal: No worrisome lytic or sclerotic osseous abnormality. Expansion of the anterior left first rib at the costosternal junction is stable in the interval. IMPRESSION: 1. Stable exam. No new or progressive findings to suggest recurrent or metastatic disease. 2. Stable architectural distortion and scarring in the parahilar right lung. Nodular component measured previously adjacent to the upper mediastinum of the right lung is stable to minimally decreased in the interval. 3. Small right pleural effusion seen previously has decreased in the interval. 4. Scattered tiny bilateral pulmonary nodules measuring in the 2-3 mm size range, most of which were present previously and are unchanged. Attention on follow-up recommended. 5. Aortic Atherosclerosis (ICD10-I70.0) and Emphysema (ICD10-J43.9). Electronically Signed   By: Camellia Candle M.D.   On: 11/14/2023 07:30    ASSESSMENT: Stage IIIb adenocarcinoma of the lung.  PLAN:    Stage IIIb adenocarcinoma of the lung: Biopsy from bronchoscopy on June 07, 2022 confirming the diagnosis.  PET scan results from May 22, 2022 reviewed independently confirming stage of disease.  The bilateral hypermetabolic cervical lymph nodes are suspicious, but will  treat patient as a stage IIIb.  MRI of the brain on Jun 19, 2022 did not reveal any metastatic disease.  Patient completed weekly carboplatin  and Taxol  on July 31, 2022.  Patient completed year-long maintenance durvalumab  on August 13, 2023.  Repeat imaging on November 14, 2023 reviewed independently and reported as above with no obvious evidence of disease.  No intervention is needed at this time.  Return to clinic in 6 months with repeat imaging and further evaluation. Peripheral neuropathy: Chronic and unchanged.   Adjustment/anxiety: Continue Wellbutrin  as prescribed. Shortness of breath: Patient was given a referral back to pulmonology.   Patient expressed understanding and was in agreement with this plan.  She also understands that She can call clinic at any time with any questions, concerns, or complaints.    Cancer Staging  Adenocarcinoma of right lung [facility]) Staging form: Lung, AJCC 8th Edition - Clinical stage from 06/12/2022: Stage IIIB (cT2b, cN3, cM0) - Signed by [name] [name], MD on 06/12/2022 Stage prefix: Initial diagnosis   [name] [name], MD   11/21/2023 7:20 AM

## 2023-11-21 ENCOUNTER — Encounter: Payer: Self-pay | Admitting: Oncology

## 2023-11-21 ENCOUNTER — Other Ambulatory Visit: Payer: Self-pay

## 2023-11-21 LAB — SURGICAL PATHOLOGY

## 2023-11-22 ENCOUNTER — Ambulatory Visit: Payer: Self-pay | Admitting: Dermatology

## 2023-11-24 DIAGNOSIS — H5213 Myopia, bilateral: Secondary | ICD-10-CM | POA: Diagnosis not present

## 2023-11-24 NOTE — Telephone Encounter (Signed)
-----   Message from Rexene Rattler sent at 11/22/2023 12:40 PM EDT ----- 1. Skin, left upper knee :       VERRUCA VULGARIS, ENDOPHYTIC TYPE   Deep wart, benign, already treated with EDC at time of biopsy, cryotherapy if it recurs - please call patient ----- Message ----- From: Interface, Lab In Three [facility] Seven Sent: 11/21/2023   6:20 PM EDT To: Rexene Rattler, MD

## 2023-11-24 NOTE — Telephone Encounter (Signed)
 Advised patient biopsy of the left upper knee was benign wart, cryotherapy if recurs.

## 2023-12-02 ENCOUNTER — Encounter: Payer: Self-pay | Admitting: Family Medicine

## 2023-12-02 ENCOUNTER — Ambulatory Visit (INDEPENDENT_AMBULATORY_CARE_PROVIDER_SITE_OTHER): Admitting: Family Medicine

## 2023-12-02 VITALS — BP 120/90 | HR 103 | Temp 98.9°F | Ht 64.0 in | Wt 162.0 lb

## 2023-12-02 DIAGNOSIS — N939 Abnormal uterine and vaginal bleeding, unspecified: Secondary | ICD-10-CM | POA: Diagnosis not present

## 2023-12-02 DIAGNOSIS — L219 Seborrheic dermatitis, unspecified: Secondary | ICD-10-CM

## 2023-12-02 DIAGNOSIS — N898 Other specified noninflammatory disorders of vagina: Secondary | ICD-10-CM

## 2023-12-02 DIAGNOSIS — N3001 Acute cystitis with hematuria: Secondary | ICD-10-CM | POA: Diagnosis not present

## 2023-12-02 LAB — POC URINALSYSI DIPSTICK (AUTOMATED)
Bilirubin, UA: NEGATIVE
Blood, UA: NEGATIVE
Glucose, UA: NEGATIVE
Ketones, UA: NEGATIVE
Nitrite, UA: NEGATIVE
Protein, UA: NEGATIVE
Spec Grav, UA: 1.01 (ref 1.010–1.025)
Urobilinogen, UA: 0.2 U/dL
pH, UA: 6 (ref 5.0–8.0)

## 2023-12-02 NOTE — Progress Notes (Signed)
 Patient ID: Connie West, female    DOB: 1955/10/22, 68 y.o.   MRN: [id number]  This visit was conducted in person.  BP (!) 120/90   Pulse (!) 103   Temp 98.9 F (37.2 C) (Temporal)   Ht 5' 4 (1.626 m)   Wt 162 lb (73.5 kg)   SpO2 99%   BMI 27.81 kg/m    CC:  Chief Complaint  Patient presents with   Vaginal Discharge   Vaginal Bleeding    Subjective:   HPI: Connie West is a 68 y.o. female presenting on 12/02/2023 for Vaginal Discharge and Vaginal Bleeding   She reports new onset  vaginal discharge in last 2-3 weeks. Liquidy, yellow.  Yesterday  noted  light red blood in discharge on toilet tissue.  Mild vaginal discomfort. No itchiness.  No vaginal sores.  No blood in urine.  No blood in stool.  No urinary frequency or urgency. No dysuria.  N fever, no flank pain.    She is currently followed by Oncology for adenocarcinoma of right lung s/p radiation, chemo  Treated  for UTI in 09/20/2023 .SABRA Symptoms resolved completely.  Relevant past medical, surgical, family and social history reviewed and updated as indicated. Interim medical history since our last visit reviewed. Allergies and medications reviewed and updated. Outpatient Medications Prior to Visit  Medication Sig Dispense Refill   aspirin EC 81 MG tablet Take 81 mg by mouth in the morning.     buPROPion  (WELLBUTRIN  XL) 150 MG 24 hr tablet TAKE 1 TABLET BY MOUTH IN THE MORNING. FOR MOOD AND FATIGUE. 90 tablet 1   calcium carbonate (OS-CAL) 600 MG TABS Take 600 mg by mouth every evening.     Coenzyme Q10 (CO Q 10 PO) Take 1 capsule by mouth in the morning.     Fluocinolone  Acetonide 0.01 % OIL Apply twice daily to ears as needed for rash/itching 20 mL 2   ibuprofen (ADVIL,MOTRIN) 200 MG tablet Take 400 mg by mouth every 8 (eight) hours as needed (pain.).     ketoconazole  (NIZORAL ) 2 % shampoo 2-3 times per week lather on scalp and ears, leave on 8-10 minutes, rinse well (Patient taking differently: Apply 1  Application topically once a week. Once per week lather on scalp and ears, leave on 8-10 minutes, rinse well) 120 mL 5   lidocaine -prilocaine  (EMLA ) cream Apply 1 Application topically as needed.     MAGNESIUM PO Take 1 tablet by mouth every evening.     OMEGA-3 FATTY ACIDS PO Take 1 g by mouth every evening.     simvastatin  (ZOCOR ) 40 MG tablet TAKE 1 TABLET BY MOUTH EVERY DAY 90 tablet 3   VITAMIN D  PO Take 1,000 Units by mouth in the morning.     sulfamethoxazole -trimethoprim  (BACTRIM  DS) 800-160 MG tablet Take 1 tablet by mouth 2 (two) times daily. 6 tablet 0   No facility-administered medications prior to visit.     Per HPI unless specifically indicated in ROS section below Review of Systems  Constitutional:  Negative for fatigue and fever.  HENT:  Negative for congestion.   Eyes:  Negative for pain.  Respiratory:  Negative for cough and shortness of breath.   Cardiovascular:  Negative for chest pain, palpitations and leg swelling.  Gastrointestinal:  Negative for abdominal pain.  Genitourinary:  Positive for vaginal bleeding and vaginal discharge. Negative for decreased urine volume, dysuria, hematuria, pelvic pain, urgency and vaginal pain.  Musculoskeletal:  Negative for back  pain.  Neurological:  Negative for syncope, light-headedness and headaches.  Psychiatric/Behavioral:  Negative for dysphoric mood.    Objective:  BP (!) 120/90   Pulse (!) 103   Temp 98.9 F (37.2 C) (Temporal)   Ht 5' 4 (1.626 m)   Wt 162 lb (73.5 kg)   SpO2 99%   BMI 27.81 kg/m   Wt Readings from Last 3 Encounters:  12/02/23 162 lb (73.5 kg)  11/20/23 162 lb (73.5 kg)  10/23/23 159 lb 4 oz (72.2 kg)      Physical Exam Exam conducted with a chaperone present.  Constitutional:      General: She is not in acute distress.    Appearance: Normal appearance. She is well-developed. She is not ill-appearing or toxic-appearing.  HENT:     Head: Normocephalic.     Right Ear: Hearing, tympanic  membrane, ear canal and external ear normal. Tympanic membrane is not erythematous, retracted or bulging.     Left Ear: Hearing, tympanic membrane, ear canal and external ear normal. Tympanic membrane is not erythematous, retracted or bulging.     Nose: No mucosal edema or rhinorrhea.     Right Sinus: No maxillary sinus tenderness or frontal sinus tenderness.     Left Sinus: No maxillary sinus tenderness or frontal sinus tenderness.     Mouth/Throat:     Pharynx: Uvula midline.  Eyes:     General: Lids are normal. Lids are everted, no foreign bodies appreciated.     Conjunctiva/sclera: Conjunctivae normal.     Pupils: Pupils are equal, round, and reactive to light.  Neck:     Thyroid : No thyroid  mass or thyromegaly.     Vascular: No carotid bruit.     Trachea: Trachea normal.  Cardiovascular:     Rate and Rhythm: Normal rate and regular rhythm.     Pulses: Normal pulses.     Heart sounds: Normal heart sounds, S1 normal and S2 normal. No murmur heard.    No friction rub. No gallop.  Pulmonary:     Effort: Pulmonary effort is normal. No tachypnea or respiratory distress.     Breath sounds: Normal breath sounds. No decreased breath sounds, wheezing, rhonchi or rales.  Abdominal:     General: Bowel sounds are normal.     Palpations: Abdomen is soft.     Tenderness: There is no abdominal tenderness.  Genitourinary:    Labia:        Right: No rash, tenderness, lesion or injury.        Left: No rash, tenderness, lesion or injury.      Urethra: No prolapse, urethral pain, urethral swelling or urethral lesion.     Vagina: Vaginal discharge present. No tenderness.  Musculoskeletal:     Cervical back: Normal range of motion and neck supple.  Skin:    General: Skin is warm and dry.     Findings: No rash.  Neurological:     Mental Status: She is alert.  Psychiatric:        Mood and Affect: Mood is not anxious or depressed.        Speech: Speech normal.        Behavior: Behavior normal.  Behavior is cooperative.        Thought Content: Thought content normal.        Judgment: Judgment normal.       Results for orders placed or performed in visit on 12/02/23  POCT Urinalysis Dipstick (Automated)   Collection Time:  12/02/23 10:50 AM  Result Value Ref Range   Color, UA Yellow    Clarity, UA Clear    Glucose, UA Negative Negative   Bilirubin, UA Negative    Ketones, UA Negative    Spec Grav, UA 1.010 1.010 - 1.025   Blood, UA Negative    pH, UA 6.0 5.0 - 8.0   Protein, UA Negative Negative   Urobilinogen, UA 0.2 0.2 or 1.0 E.U./dL   Nitrite, UA Negative    Leukocytes, UA Large (3+) (A) Negative  WET PREP BY MOLECULAR PROBE   Collection Time: 12/02/23 11:04 AM   Specimen: Vaginal Swab  Result Value Ref Range   MICRO NUMBER: [id number]    SPECIMEN QUALITY: Adequate    SOURCE: NOT GIVEN    STATUS: FINAL    Trichomonas vaginosis Not Detected    Gardnerella vaginalis (A)     Detected. Increased levels of G. vaginalis may not be significant in the absence of signs and symptoms of bacterial vaginosis.   Candida species Not Detected   Urine Culture   Collection Time: 12/02/23 11:04 AM   Specimen: Urine  Result Value Ref Range   MICRO NUMBER: [id number]    SPECIMEN QUALITY: Adequate    Sample Source NOT GIVEN    STATUS: FINAL    Result: No Growth     Assessment and Plan  Vaginal discharge Assessment & Plan: Acute, will send wet prep to evaluate for yeast and bacteria. Postmenopausal atrophic vaginitis also possible.  Orders: -     WET PREP BY MOLECULAR PROBE  Vaginal bleeding, abnormal Assessment & Plan: Given some blood seen in discharge will evaluate with urinalysis to assure that there is no urinary source of blood. UA positive for white blood cells, no clear source of bleeding.  Will send urine for culture.  Orders: -     POCT Urinalysis Dipstick (Automated) -     Urine Culture    No follow-ups on file.   Greig Ring, MD

## 2023-12-03 ENCOUNTER — Ambulatory Visit: Payer: Self-pay | Admitting: Family Medicine

## 2023-12-03 LAB — WET PREP BY MOLECULAR PROBE
Candida species: NOT DETECTED
MICRO NUMBER:: 17127466
SPECIMEN QUALITY:: ADEQUATE
Trichomonas vaginosis: NOT DETECTED

## 2023-12-03 LAB — URINE CULTURE
MICRO NUMBER:: 17127465
Result:: NO GROWTH
SPECIMEN QUALITY:: ADEQUATE

## 2023-12-03 MED ORDER — METRONIDAZOLE 500 MG PO TABS: 500.0000 mg | ORAL_TABLET | Freq: Two times a day (BID) | ORAL | 0 refills | Status: DC

## 2023-12-12 ENCOUNTER — Encounter: Payer: Self-pay | Admitting: Oncology

## 2023-12-16 ENCOUNTER — Other Ambulatory Visit: Payer: Self-pay | Admitting: Family Medicine

## 2023-12-19 MED ORDER — METRONIDAZOLE 500 MG PO TABS: 500.0000 mg | ORAL_TABLET | Freq: Two times a day (BID) | ORAL | 0 refills | Status: DC

## 2023-12-19 NOTE — Addendum Note (Signed)
 Addended by: AVELINA NO E on: 12/19/2023 05:44 PM   Modules accepted: Orders

## 2023-12-21 DIAGNOSIS — N898 Other specified noninflammatory disorders of vagina: Secondary | ICD-10-CM | POA: Insufficient documentation

## 2023-12-21 DIAGNOSIS — N939 Abnormal uterine and vaginal bleeding, unspecified: Secondary | ICD-10-CM | POA: Insufficient documentation

## 2023-12-21 NOTE — Assessment & Plan Note (Signed)
 Given some blood seen in discharge will evaluate with urinalysis to assure that there is no urinary source of blood. UA positive for white blood cells, no clear source of bleeding.  Will send urine for culture.

## 2023-12-21 NOTE — Assessment & Plan Note (Signed)
 Acute, will send wet prep to evaluate for yeast and bacteria. Postmenopausal atrophic vaginitis also possible.

## 2023-12-25 ENCOUNTER — Encounter: Payer: Self-pay | Admitting: Radiation Oncology

## 2023-12-25 ENCOUNTER — Ambulatory Visit
Admission: RE | Admit: 2023-12-25 | Discharge: 2023-12-25 | Disposition: A | Discharge status: Home / Self Care | Source: Ambulatory Visit | Attending: Radiation Oncology | Admitting: Radiation Oncology

## 2023-12-25 VITALS — BP 137/92 | HR 98 | Temp 98.4°F | Resp 18 | Ht 64.0 in | Wt 168.0 lb

## 2023-12-25 DIAGNOSIS — C3411 Malignant neoplasm of upper lobe, right bronchus or lung: Secondary | ICD-10-CM | POA: Insufficient documentation

## 2023-12-25 DIAGNOSIS — R918 Other nonspecific abnormal finding of lung field: Secondary | ICD-10-CM | POA: Insufficient documentation

## 2023-12-25 DIAGNOSIS — C3491 Malignant neoplasm of unspecified part of right bronchus or lung: Secondary | ICD-10-CM

## 2023-12-25 DIAGNOSIS — Z9221 Personal history of antineoplastic chemotherapy: Secondary | ICD-10-CM | POA: Diagnosis not present

## 2023-12-25 DIAGNOSIS — Z923 Personal history of irradiation: Secondary | ICD-10-CM | POA: Insufficient documentation

## 2023-12-25 DIAGNOSIS — Z87891 Personal history of nicotine dependence: Secondary | ICD-10-CM | POA: Diagnosis not present

## 2023-12-25 DIAGNOSIS — C3481 Malignant neoplasm of overlapping sites of right bronchus and lung: Secondary | ICD-10-CM | POA: Diagnosis not present

## 2023-12-25 DIAGNOSIS — R0609 Other forms of dyspnea: Secondary | ICD-10-CM | POA: Insufficient documentation

## 2023-12-25 DIAGNOSIS — R062 Wheezing: Secondary | ICD-10-CM | POA: Insufficient documentation

## 2023-12-25 NOTE — Progress Notes (Signed)
 Radiation Oncology Follow up Note  Name: Connie West   Date:   12/25/2023 MRN:  [id number] DOB: Nov 08, 1955    This 68 y.o. female presents to the clinic today for 37-month follow-up status post palliative radiation therapy for initial stage III adenocarcinoma the right lung currently on maintenance Durvalumab .  REFERRING PROVIDER: [name] BRAVO, MD  HPI: Patient is a 68 year old female now out 17 months having completed palliative radiation therapy.  To her chest for stage I stage IIIb adenocarcinoma the right lung.  She completed carboplatinum and Taxol  with radiation therapy back in 24.  She was also completed a year of maintenance Durvalumab  in July.  CT scan in October showed stable exam no new or progressive findings to suggest recurrent or metastatic disease.  She has architectural distortion scarring in the perihilar right lung.  She also has small tiny bilateral pulmonary nodules measuring 2 to 3 mm which are stable over time.  She does complain of some wheezing and some dyspnea on exertion.  This is unchanged from prior to treatment.  COMPLICATIONS OF TREATMENT: none  FOLLOW UP COMPLIANCE: keeps appointments   PHYSICAL EXAM:  BP (!) 137/92   Pulse 98   Temp 98.4 F (36.9 C) (Tympanic)   Resp 18   Ht 5' 4 (1.626 m)   Wt 168 lb (76.2 kg)   SpO2 98%   BMI 28.84 kg/m  Well-developed well-nourished patient in NAD. HEENT reveals PERLA, EOMI, discs not visualized.  Oral cavity is clear. No oral mucosal lesions are identified. Neck is clear without evidence of cervical or supraclavicular adenopathy. Lungs are clear to A&P. Cardiac examination is essentially unremarkable with regular rate and rhythm without murmur rub or thrill. Abdomen is benign with no organomegaly or masses noted. Motor sensory and DTR levels are equal and symmetric in the upper and lower extremities. Cranial nerves II through XII are grossly intact. Proprioception is intact. No peripheral adenopathy or edema is  identified. No motor or sensory levels are noted. Crude visual fields are within normal range.  RADIOLOGY RESULTS: CT scans reviewed compatible with above-stated findings.  Next CT scan is in April 2026  PLAN: Present time patient is doing well no evidence of progressive disease at this time.  I have asked to see her back in April after her next CT scan.  At that time if things are stable we will turn follow-up care over to medical oncology.  Patient knows to call at anytime with any concerns.  I would like to take this opportunity to thank you for allowing me to participate in the care of your patient.SABRA Marcey Penton, MD

## 2024-01-06 ENCOUNTER — Other Ambulatory Visit: Payer: Self-pay | Admitting: Nurse Practitioner

## 2024-01-12 ENCOUNTER — Ambulatory Visit: Admitting: Student in an Organized Health Care Education/Training Program

## 2024-01-12 ENCOUNTER — Encounter: Payer: Self-pay | Admitting: Student in an Organized Health Care Education/Training Program

## 2024-01-12 VITALS — BP 130/86 | HR 98 | Temp 97.6°F | Ht 64.0 in | Wt 162.8 lb

## 2024-01-12 DIAGNOSIS — K219 Gastro-esophageal reflux disease without esophagitis: Secondary | ICD-10-CM

## 2024-01-12 DIAGNOSIS — R0602 Shortness of breath: Secondary | ICD-10-CM

## 2024-01-12 DIAGNOSIS — C3491 Malignant neoplasm of unspecified part of right bronchus or lung: Secondary | ICD-10-CM

## 2024-01-12 MED ORDER — UMECLIDINIUM-VILANTEROL 62.5-25 MCG/ACT IN AEPB: 1.0000 | INHALATION_SPRAY | Freq: Every day | RESPIRATORY_TRACT | 11 refills | Status: AC

## 2024-01-12 NOTE — Progress Notes (Signed)
 Assessment & Plan:   Assessment & Plan  #Right lung post-radiation fibrosis #Shortness of Breath  Exertional dyspnea due to post-radiation fibrosis. Significant scarring noted on her surveillance chest CT's, which would result in reduced lung function. Breathing test not pursued as it would not alter management; I suspect PFT's would show reduced DLCO and lung volumes, as well as altered spirometry given history of smoking as well as radiation. Will empirically treat with LABA/LAMA, and re-evaluate symptoms on follow up. Also encouraged the patient to gradually increase her physical activity.  - umeclidinium-vilanterol (ANORO ELLIPTA) 62.5-25 MCG/ACT AEPB; Inhale 1 puff into the lungs daily.  Dispense: 30 each; Refill: 11 - Encouraged gradual increase in physical activity.  #Right upper lobe lung adenocarcinoma (Stage III)  Treated with chemoradiation and durvalumab . Stable with no disease progression on recent imaging. CT shows post-radiation changes with significant scarring.  - Continue surveillance with oncology follow-up in six months.  #Gastroesophageal reflux disease (GERD) post-radiation  GERD symptoms exacerbated by radiation therapy affecting the esophagus.  - Continue dietary modifications.   Return in about 1 year (around 01/11/2025).  Belva November, MD [facility]  I spent 31 minutes caring for this patient today, including preparing to see the patient, obtaining a medical history , reviewing a separately obtained history, performing a medically appropriate examination and/or evaluation, counseling and educating the patient/family/caregiver, ordering medications, tests, or procedures, documenting clinical information in the electronic health record, and independently interpreting results (not separately reported/billed) and communicating results to the patient/family/caregiver  End of visit medications:  Meds ordered this encounter  Medications    umeclidinium-vilanterol (ANORO ELLIPTA) 62.5-25 MCG/ACT AEPB    Sig: Inhale 1 puff into the lungs daily.    Dispense:  30 each    Refill:  11     Current Outpatient Medications:    aspirin EC 81 MG tablet, Take 81 mg by mouth in the morning., Disp: , Rfl:    buPROPion  (WELLBUTRIN  XL) 150 MG 24 hr tablet, TAKE 1 TABLET BY MOUTH IN THE MORNING. FOR MOOD AND FATIGUE., Disp: 90 tablet, Rfl: 1   calcium carbonate (OS-CAL) 600 MG TABS, Take 600 mg by mouth every evening., Disp: , Rfl:    Coenzyme Q10 (CO Q 10 PO), Take 1 capsule by mouth in the morning., Disp: , Rfl:    Fluocinolone  Acetonide 0.01 % OIL, Apply twice daily to ears as needed for rash/itching, Disp: 20 mL, Rfl: 2   ibuprofen (ADVIL,MOTRIN) 200 MG tablet, Take 400 mg by mouth every 8 (eight) hours as needed (pain.)., Disp: , Rfl:    ketoconazole  (NIZORAL ) 2 % shampoo, 2-3 times per week lather on scalp and ears, leave on 8-10 minutes, rinse well, Disp: 120 mL, Rfl: 5   lidocaine -prilocaine  (EMLA ) cream, Apply 1 Application topically as needed., Disp: , Rfl:    MAGNESIUM PO, Take 1 tablet by mouth every evening., Disp: , Rfl:    metroNIDAZOLE  (FLAGYL ) 500 MG tablet, Take 1 tablet (500 mg total) by mouth 2 (two) times daily., Disp: 14 tablet, Rfl: 0   OMEGA-3 FATTY ACIDS PO, Take 1 g by mouth every evening., Disp: , Rfl:    simvastatin  (ZOCOR ) 40 MG tablet, TAKE 1 TABLET BY MOUTH EVERY DAY, Disp: 90 tablet, Rfl: 3   umeclidinium-vilanterol (ANORO ELLIPTA) 62.5-25 MCG/ACT AEPB, Inhale 1 puff into the lungs daily., Disp: 30 each, Rfl: 11   VITAMIN D  PO, Take 1,000 Units by mouth in the morning., Disp: , Rfl:  Subjective:   PATIENT ID: Connie West GENDER: female DOB: 1955-12-20, MRN: [id number]  Chief Complaint  Patient presents with   Medical Management of Chronic Issues    Shortness of breath on exertion. Occasional cough.     HPI  Discussed the use of AI scribe software for clinical note transcription with the patient, who  gave verbal consent to proceed.  History of Present Illness  Connie West is a 68 year old female with stage III adenocarcinoma of the lung who presents with shortness of breath on exertion.  She has a history of stage III adenocarcinoma of the lung, initially diagnosed in April 2024, with two right upper lobe pulmonary masses and associated PET avid lymphadenopathy. She underwent robotic-assisted navigational bronchoscopy with biopsy confirming adenocarcinoma, and EBUS showed adenocarcinoma at stations 11L and 7. Treatment included concurrent chemoradiation followed by maintenance durvalumab , completed in July 2025. She is currently on surveillance with no evidence of disease progression on recent imaging.  She experiences shortness of breath, particularly with exertion, which began after her treatments. This symptom was exacerbated during a trip to [place] in September 2025, where she engaged in more walking than usual. The shortness of breath occurs during physical activity, such as walking to the mailbox or trash can, and resolves with rest. This symptom was present to a lesser extent before her cancer diagnosis but has worsened post-treatment. No use of inhalers and no significant cough, though a little wheezing was noted by another physician two weeks ago.  Her most recent CT scan from October 2025 shows stable post-radiation changes in the right parahilar lung with no new or progressive findings.  She also reports experiencing acid reflux, particularly at night when lying down, which she attributes to the radiation affecting her esophagus. She manages her diet by avoiding foods that exacerbate her symptoms and limits her coffee intake to one cup in the morning.   Patient is originally from [place]. She has a history of smoking, quit 2014 (started age 24, 1/2 pack daily). 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 Patient's x-husband passed away of  lung cancer.   Ancillary information including prior medications, full medical/surgical/family/social histories, and PFTs (when available) are listed below and have been reviewed.    Review of Systems  Constitutional:  Negative for chills, fever, malaise/fatigue and weight loss.  Respiratory:  Positive for shortness of breath. Negative for cough, hemoptysis, sputum production and wheezing.   Cardiovascular:  Negative for chest pain.     Objective:   Vitals:   01/12/24 1114  BP: 130/86  Pulse: 98  Temp: 97.6 F (36.4 C)  TempSrc: Temporal  SpO2: 99%  Weight: 162 lb 12.8 oz (73.8 kg)  Height: 5' 4 (1.626 m)   99% on RA  BMI Readings from Last 3 Encounters:  01/12/24 27.94 kg/m  12/25/23 28.84 kg/m  12/02/23 27.81 kg/m   Wt Readings from Last 3 Encounters:  01/12/24 162 lb 12.8 oz (73.8 kg)  12/25/23 168 lb (76.2 kg)  12/02/23 162 lb (73.5 kg)     Physical Exam Constitutional:      Appearance: Normal appearance.  Cardiovascular:     Rate and Rhythm: Normal rate and regular rhythm.     Pulses: Normal pulses.     Heart sounds: Normal heart sounds.  Pulmonary:     Effort: Pulmonary effort is normal. No respiratory distress.     Breath sounds: Normal breath sounds. No wheezing or rales.  Neurological:     General: No focal deficit present.     Mental Status: She is alert and oriented to person, place, and time. Mental status is at baseline.       Ancillary Information    Past Medical History:  Diagnosis Date   Allergy 1998   Penicillin   Complication of anesthesia    GERD (gastroesophageal reflux disease) 2015   Only occasionally   Hyperlipidemia    Lung cancer, main bronchus, right (HCC)    Personal history of chemotherapy    Personal history of immunosuppression therapy    Personal history of radiation therapy    PONV (postoperative nausea and vomiting)    Stroke (HCC) 2019   TIA (transient ischemic attack)      Family History  Problem  Relation Age of Onset   Breast cancer Mother 62   Cancer Mother        breast   Cancer Father        bone cancer     Past Surgical History:  Procedure Laterality Date   BREAST CYST ASPIRATION Right 07/20/2012   FNA benign   BREAST CYST ASPIRATION Right    BREAST SURGERY Right 07/20/2012   FNA benign   BRONCHIAL NEEDLE ASPIRATION BIOPSY  06/07/2022   Procedure: BRONCHIAL NEEDLE ASPIRATION BIOPSIES;  Surgeon: Isadora Hose, MD;  Location: [facility];  Service: Pulmonary;;   CESAREAN SECTION     CHOLECYSTECTOMY     COLONOSCOPY WITH PROPOFOL  N/A 03/21/2021   Procedure: COLONOSCOPY WITH PROPOFOL ;  Surgeon: Unk Corinn Skiff, MD;  Location: [facility];  Service: Gastroenterology;  Laterality: N/A;   IR IMAGING GUIDED PORT INSERTION  06/21/2022   OVARY SURGERY     TUBAL LIGATION  1990   VIDEO BRONCHOSCOPY WITH ENDOBRONCHIAL ULTRASOUND N/A 06/07/2022   Procedure: VIDEO BRONCHOSCOPY WITH ENDOBRONCHIAL ULTRASOUND;  Surgeon: Isadora Hose, MD;  Location: [facility];  Service: Pulmonary;  Laterality: N/A;    Social History   Socioeconomic History   Marital status: Divorced    Spouse name: Not on file   Number of children: 2   Years of education: Not on file   Highest education level: GED or equivalent  Occupational History   Occupation: Estate Agent: [employer]  Tobacco Use   Smoking status: Former    Current packs/day: 0.00    Average packs/day: 0.6 packs/day for 43.0 years (26.7 ttl pk-yrs)    Types: Cigarettes    Start date: 83    Quit date: 2016    Years since quitting: 9.9   Smokeless tobacco: Never   Tobacco comments:    quit x 1 month 11/16  Vaping Use   Vaping status: Never Used  Substance and Sexual Activity   Alcohol use: Yes    Comment: occasional : 1x/week   Drug use: No   Sexual activity: Not on file  Other Topics Concern   Not on file  Social History Narrative   From [place]         Social Drivers of Health   Financial  Resource Strain: Low Risk  (12/01/2023)   Overall Financial Resource Strain (CARDIA)    Difficulty of Paying Living Expenses: Not hard at all  Food Insecurity: No Food Insecurity (12/01/2023)   Hunger Vital Sign    Worried About Running Out of Food in the Last Year: Never true    Ran Out of Food in the Last Year: Never true  Transportation Needs: No Transportation Needs (12/01/2023)  PRAPARE - Administrator, Civil Service (Medical): No    Lack of Transportation (Non-Medical): No  Physical Activity: Inactive (12/01/2023)   Exercise Vital Sign    Days of Exercise per Week: 0 days    Minutes of Exercise per Session: Not on file  Stress: No Stress Concern Present (12/01/2023)   [employer] of Occupational Health - Occupational Stress Questionnaire    Feeling of Stress: Only a little  Social Connections: Socially Isolated (12/01/2023)   Social Connection and Isolation Panel    Frequency of Communication with Friends and Family: Three times a week    Frequency of Social Gatherings with Friends and Family: Twice a week    Attends Religious Services: Never    Database Administrator or Organizations: No    Attends Engineer, Structural: Not on file    Marital Status: Divorced  Intimate Partner Violence: Not At Risk (01/23/2023)   Humiliation, Afraid, Rape, and Kick questionnaire    Fear of Current or Ex-Partner: No    Emotionally Abused: No    Physically Abused: No    Sexually Abused: No     Allergies  Allergen Reactions   Penicillins Hives     CBC    Component Value Date/Time   WBC 4.8 11/20/2023 1013   RBC 4.29 11/20/2023 1013   HGB 12.7 11/20/2023 1013   HGB 12.7 08/13/2023 0848   HCT 38.4 11/20/2023 1013   PLT 331 11/20/2023 1013   PLT 254 08/13/2023 0848   MCV 89.5 11/20/2023 1013   MCH 29.6 11/20/2023 1013   MCHC 33.1 11/20/2023 1013   RDW 13.0 11/20/2023 1013   LYMPHSABS 1.2 11/20/2023 1013   MONOABS 0.4 11/20/2023 1013   EOSABS 0.1  11/20/2023 1013   BASOSABS 0.0 11/20/2023 1013    Pulmonary Functions Testing Results:     No data to display          Outpatient Medications Prior to Visit  Medication Sig Dispense Refill   aspirin EC 81 MG tablet Take 81 mg by mouth in the morning.     buPROPion  (WELLBUTRIN  XL) 150 MG 24 hr tablet TAKE 1 TABLET BY MOUTH IN THE MORNING. FOR MOOD AND FATIGUE. 90 tablet 1   calcium carbonate (OS-CAL) 600 MG TABS Take 600 mg by mouth every evening.     Coenzyme Q10 (CO Q 10 PO) Take 1 capsule by mouth in the morning.     Fluocinolone  Acetonide 0.01 % OIL Apply twice daily to ears as needed for rash/itching 20 mL 2   ibuprofen (ADVIL,MOTRIN) 200 MG tablet Take 400 mg by mouth every 8 (eight) hours as needed (pain.).     ketoconazole  (NIZORAL ) 2 % shampoo 2-3 times per week lather on scalp and ears, leave on 8-10 minutes, rinse well 120 mL 5   lidocaine -prilocaine  (EMLA ) cream Apply 1 Application topically as needed.     MAGNESIUM PO Take 1 tablet by mouth every evening.     metroNIDAZOLE  (FLAGYL ) 500 MG tablet Take 1 tablet (500 mg total) by mouth 2 (two) times daily. 14 tablet 0   OMEGA-3 FATTY ACIDS PO Take 1 g by mouth every evening.     simvastatin  (ZOCOR ) 40 MG tablet TAKE 1 TABLET BY MOUTH EVERY DAY 90 tablet 3   VITAMIN D  PO Take 1,000 Units by mouth in the morning.     No facility-administered medications prior to visit.

## 2024-01-12 NOTE — Patient Instructions (Signed)
  VISIT SUMMARY: Today, you were seen for your ongoing shortness of breath and acid reflux. You have a history of stage III lung cancer, which is currently stable after treatment. We discussed your symptoms and reviewed your recent imaging results.  YOUR PLAN: -RIGHT UPPER LOBE LUNG ADENOCARCINOMA: Your lung cancer is currently stable with no signs of progression. We will continue to monitor your condition with follow-up appointments every six months.  -RIGHT LUNG POST-RADIATION FIBROSIS WITH LOSS OF FUNCTION AND EXERTIONAL DYSPNEA: The shortness of breath you experience is due to scarring in your lungs from radiation treatment, which has reduced lung function. You are prescribed an inhaler to use once daily in the morning. If you do not notice any improvement, you can stop using it. Additionally, try to gradually increase your physical activity.  -GASTROESOPHAGEAL REFLUX DISEASE (GERD) POST-RADIATION: Your acid reflux is likely worsened by the radiation treatment affecting your esophagus. Continue with your current dietary modifications to manage your symptoms.  INSTRUCTIONS: Please follow up with oncology in six months for continued surveillance of your lung cancer. Use the prescribed inhaler once daily in the morning and discontinue if there is no improvement. Gradually increase your physical activity as tolerated.     Contains text generated by [employer].

## 2024-01-15 ENCOUNTER — Encounter: Payer: Self-pay | Admitting: Oncology

## 2024-01-15 ENCOUNTER — Encounter: Payer: Self-pay | Admitting: Student in an Organized Health Care Education/Training Program

## 2024-01-15 ENCOUNTER — Other Ambulatory Visit (HOSPITAL_COMMUNITY): Payer: Self-pay

## 2024-01-15 NOTE — Telephone Encounter (Signed)
 Prior Auth team can you see what a covered alternative is to the Anoro that Dr. Isadora sent in? Thank you!

## 2024-01-16 ENCOUNTER — Telehealth: Payer: Self-pay

## 2024-01-16 NOTE — Telephone Encounter (Addendum)
 Copied from CRM [id number]. Topic: Clinical - Prescription Issue >> Jan 16, 2024 10:14 AM Rozanna MATSU wrote: Reason for CRM: pt stated she spoke with [employer] and they would like a list of options, due to pt stated the inhaler umeclidinium-vilanterol (ANORO ELLIPTA ) 62.5-25 MCG/ACT AEPB is to expensive for her to afford.     X1 VM/ LM to return call     (Patient may have a deductible to meet causing higher prices. Unable to do additional benefits investigation due to the Anoro still being filled at the patients pharmacy. Alternatives may not be any cheaper as they would typically all be in the same class of medication. Prices typically drop when deductible is met (however insurance may reset for the Jan 01 new year)

## 2024-01-19 ENCOUNTER — Other Ambulatory Visit (HOSPITAL_COMMUNITY): Payer: Self-pay

## 2024-01-22 ENCOUNTER — Telehealth: Payer: Self-pay

## 2024-01-22 NOTE — Telephone Encounter (Signed)
 Copied from CRM [id number]. Topic: Clinical - Prescription Issue >> Jan 19, 2024  1:42 PM Joesph PARAS wrote: Reason for CRM: Patient has been informed of previous message. Patient states she does not know what to do. She cannot afford the cost of the inhaler and if alternatives will be no cheaper, she will not be able to afford those either. Please advise the patient.   Tried to reach out to patient due to it being a deductible that she needs to meet with her insurance there is nothing we can do she can contact her insurance company

## 2024-01-26 ENCOUNTER — Ambulatory Visit: Payer: Medicare Other

## 2024-01-26 ENCOUNTER — Telehealth: Payer: Self-pay

## 2024-01-26 DIAGNOSIS — Z0000 Encounter for general adult medical examination without abnormal findings: Secondary | ICD-10-CM | POA: Diagnosis not present

## 2024-01-26 DIAGNOSIS — Z1231 Encounter for screening mammogram for malignant neoplasm of breast: Secondary | ICD-10-CM

## 2024-01-26 DIAGNOSIS — Z1211 Encounter for screening for malignant neoplasm of colon: Secondary | ICD-10-CM

## 2024-01-26 NOTE — Telephone Encounter (Signed)
 During AWV patient states that she was unable to get Trelegy not covered by her insurance so she is not taking it.  Told patient I would send a message to Pulm for her to see what they suggest.  Please advise.

## 2024-01-26 NOTE — Patient Instructions (Addendum)
 Connie West,  Thank you for taking the time for your Medicare Wellness Visit. I appreciate your continued commitment to your health goals. Please review the care plan we discussed, and feel free to reach out if I can assist you further.  Please note that Annual Wellness Visits do not include a physical exam. Some assessments may be limited, especially if the visit was conducted virtually. If needed, we may recommend an in-person follow-up with your provider.  Ongoing Care Seeing your primary care provider every 3 to 6 months helps us  monitor your health and provide consistent, personalized care. Appt 02/2024 for PCP and Dec 2026 for AWV  Referrals If a referral was made during today's visit and you haven't received any updates within two weeks, please contact the referred provider directly to check on the status.  Recommended Screenings:  Health Maintenance  Topic Date Due   Colon Cancer Screening  03/21/2022   COVID-19 Vaccine (6 - 2025-26 season) 10/13/2023   Medicare Annual Wellness Visit  01/23/2024   Breast Cancer Screening  03/09/2024   Flu Shot  05/11/2024*   Osteoporosis screening with Bone Density Scan  03/09/2027   DTaP/Tdap/Td vaccine (3 - Td or Tdap) 01/20/2029   Pneumococcal Vaccine for age over 53  Completed   Hepatitis C Screening  Completed   Zoster (Shingles) Vaccine  Completed   Meningitis B Vaccine  Aged Out   Screening for Lung Cancer  Discontinued  *Topic was postponed. The date shown is not the original due date.       01/26/2024    1:42 PM  Advanced Directives  Does Patient Have a Medical Advance Directive? No  Would patient like information on creating a medical advance directive? No - Patient declined    Vision: Annual vision screenings are recommended for early detection of glaucoma, cataracts, and diabetic retinopathy. These exams can also reveal signs of chronic conditions such as diabetes and high blood pressure.  Dental: Annual dental screenings  help detect early signs of oral cancer, gum disease, and other conditions linked to overall health, including heart disease and diabetes.  Please see the attached documents for additional preventive care recommendations.

## 2024-01-26 NOTE — Progress Notes (Signed)
 Chief Complaint  Patient presents with   Medicare Wellness     Subjective:   Connie West is a 68 y.o. female who presents for a Medicare Annual Wellness Visit.  Visit info / Clinical Intake: Medicare Wellness Visit Type:: Subsequent Annual Wellness Visit Persons participating in visit and providing information:: patient Medicare Wellness Visit Mode:: Telephone If telephone:: video declined Since this visit was completed virtually, some vitals may be partially provided or unavailable. Missing vitals are due to the limitations of the virtual format.: Unable to obtain vitals - no equipment If Telephone or Video please confirm:: I connected with patient using audio/video enable telemedicine. I verified patient identity with two identifiers, discussed telehealth limitations, and patient agreed to proceed. Patient Location:: Home Provider Location:: home Interpreter Needed?: No Pre-visit prep was completed: yes AWV questionnaire completed by patient prior to visit?: yes Date:: 01/22/24 Living arrangements:: (!) lives alone Patient's Overall Health Status Rating: (!) fair Typical amount of pain: none Does pain affect daily life?: no Are you currently prescribed opioids?: no  Dietary Habits and Nutritional Risks How many meals a day?: 3 Eats fruit and vegetables daily?: yes Most meals are obtained by: preparing own meals In the last 2 weeks, have you had any of the following?: none Diabetic:: no  Functional Status Activities of Daily Living (to include ambulation/medication): Independent Ambulation: Independent Medication Administration: Independent Home Management (perform basic housework or laundry): Independent Manage your own finances?: yes Primary transportation is: driving Concerns about vision?: no *vision screening is required for WTM* Concerns about hearing?: no  Fall Screening Falls in the past year?: 0 Number of falls in past year: 0 Was there an injury with  Fall?: 0 Fall Risk Category Calculator: 0 Patient Fall Risk Level: Low Fall Risk  Fall Risk Patient at Risk for Falls Due to: No Fall Risks Fall risk Follow up: Falls evaluation completed; Education provided; Falls prevention discussed  Home and Transportation Safety: All rugs have non-skid backing?: N/A, no rugs All stairs or steps have railings?: N/A, no stairs Grab bars in the bathtub or shower?: (!) no Have non-skid surface in bathtub or shower?: (!) no Good home lighting?: yes Regular seat belt use?: yes Hospital stays in the last year:: no  Cognitive Assessment Difficulty concentrating, remembering, or making decisions? : no Will 6CIT or Mini Cog be Completed: yes What year is it?: 0 points What month is it?: 0 points Give patient an address phrase to remember (5 components): [address] [place] About what time is it?: 0 points Count backwards from 20 to 1: 0 points Say the months of the year in reverse: 0 points Repeat the address phrase from earlier: 0 points 6 CIT Score: 0 points  Advance Directives (For Healthcare) Does Patient Have a Medical Advance Directive?: No Does patient want to make changes to medical advance directive?: No - Patient declined Would patient like information on creating a medical advance directive?: No - Patient declined  Reviewed/Updated  Reviewed/Updated: Reviewed All (Medical, Surgical, Family, Medications, Allergies, Care Teams, Patient Goals)    Allergies (verified) Penicillins   Current Medications (verified) Outpatient Encounter Medications as of 01/26/2024  Medication Sig   aspirin EC 81 MG tablet Take 81 mg by mouth in the morning.   buPROPion  (WELLBUTRIN  XL) 150 MG 24 hr tablet TAKE 1 TABLET BY MOUTH IN THE MORNING. FOR MOOD AND FATIGUE.   calcium carbonate (OS-CAL) 600 MG TABS Take 600 mg by mouth every evening.   Coenzyme  Q10 (CO Q 10 PO) Take 1 capsule by mouth in the morning.   Fluocinolone  Acetonide 0.01 % OIL Apply  twice daily to ears as needed for rash/itching   ibuprofen (ADVIL,MOTRIN) 200 MG tablet Take 400 mg by mouth every 8 (eight) hours as needed (pain.).   ketoconazole  (NIZORAL ) 2 % shampoo 2-3 times per week lather on scalp and ears, leave on 8-10 minutes, rinse well   lidocaine -prilocaine  (EMLA ) cream Apply 1 Application topically as needed.   MAGNESIUM PO Take 1 tablet by mouth every evening.   OMEGA-3 FATTY ACIDS PO Take 1 g by mouth every evening.   simvastatin  (ZOCOR ) 40 MG tablet TAKE 1 TABLET BY MOUTH EVERY DAY   VITAMIN D  PO Take 1,000 Units by mouth in the morning.   metroNIDAZOLE  (FLAGYL ) 500 MG tablet Take 1 tablet (500 mg total) by mouth 2 (two) times daily.   umeclidinium-vilanterol (ANORO ELLIPTA ) 62.5-25 MCG/ACT AEPB Inhale 1 puff into the lungs daily. (Patient not taking: Reported on 01/26/2024)   No facility-administered encounter medications on file as of 01/26/2024.    History: Past Medical History:  Diagnosis Date   Allergy 1998   Penicillin   Complication of anesthesia    GERD (gastroesophageal reflux disease) 2015   Only occasionally   Hyperlipidemia    Lung cancer, main bronchus, right (HCC)    Personal history of chemotherapy    Personal history of immunosuppression therapy    Personal history of radiation therapy    PONV (postoperative nausea and vomiting)    Stroke (HCC) 2019   TIA (transient ischemic attack)    Past Surgical History:  Procedure Laterality Date   BREAST CYST ASPIRATION Right 07/20/2012   FNA benign   BREAST CYST ASPIRATION Right    BREAST SURGERY Right 07/20/2012   FNA benign   BRONCHIAL NEEDLE ASPIRATION BIOPSY  06/07/2022   Procedure: BRONCHIAL NEEDLE ASPIRATION BIOPSIES;  Surgeon: Isadora Hose, MD;  Location: [facility];  Service: Pulmonary;;   CESAREAN SECTION     CHOLECYSTECTOMY     COLONOSCOPY WITH PROPOFOL  N/A 03/21/2021   Procedure: COLONOSCOPY WITH PROPOFOL ;  Surgeon: Unk Corinn Skiff, MD;  Location: [facility];   Service: Gastroenterology;  Laterality: N/A;   IR IMAGING GUIDED PORT INSERTION  06/21/2022   OVARY SURGERY     TUBAL LIGATION  1990   VIDEO BRONCHOSCOPY WITH ENDOBRONCHIAL ULTRASOUND N/A 06/07/2022   Procedure: VIDEO BRONCHOSCOPY WITH ENDOBRONCHIAL ULTRASOUND;  Surgeon: Isadora Hose, MD;  Location: [facility];  Service: Pulmonary;  Laterality: N/A;   Family History  Problem Relation Age of Onset   Breast cancer Mother 79   Cancer Mother        breast   Cancer Father        bone cancer   Social History   Occupational History   Occupation: Estate Agent: [employer]  Tobacco Use   Smoking status: Former    Current packs/day: 0.00    Average packs/day: 0.6 packs/day for 43.0 years (26.7 ttl pk-yrs)    Types: Cigarettes    Start date: 30    Quit date: 2016    Years since quitting: 9.9   Smokeless tobacco: Never   Tobacco comments:    quit x 1 month 11/16  Vaping Use   Vaping status: Never Used  Substance and Sexual Activity   Alcohol use: Yes    Comment: occasional : 1x/week   Drug use: No   Sexual activity: Not on file   Tobacco  Counseling Counseling given: Not Answered Tobacco comments: quit x 1 month 11/16  SDOH Screenings   Food Insecurity: No Food Insecurity (01/26/2024)  Housing: Low Risk (01/26/2024)  Transportation Needs: No Transportation Needs (01/26/2024)  Utilities: Not At Risk (01/26/2024)  Alcohol Screen: Low Risk (12/01/2023)  Depression (PHQ2-9): Low Risk (01/26/2024)  Financial Resource Strain: Low Risk (12/01/2023)  Physical Activity: Inactive (01/26/2024)  Social Connections: Socially Isolated (01/26/2024)  Stress: No Stress Concern Present (01/26/2024)  Tobacco Use: Medium Risk (01/12/2024)  Health Literacy: Adequate Health Literacy (01/26/2024)   See flowsheets for full screening details  Depression Screen PHQ 2 & 9 Depression Scale- Over the past 2 weeks, how often have you been bothered by any of the following  problems? Little interest or pleasure in doing things: 0 Feeling down, depressed, or hopeless (PHQ Adolescent also includes...irritable): 0 PHQ-2 Total Score: 0 Trouble falling or staying asleep, or sleeping too much: 0 Feeling tired or having little energy: 1 Poor appetite or overeating (PHQ Adolescent also includes...weight loss): 0 Feeling bad about yourself - or that you are a failure or have let yourself or your family down: 0 Trouble concentrating on things, such as reading the newspaper or watching television (PHQ Adolescent also includes...like school work): 0 Moving or speaking so slowly that other people could have noticed. Or the opposite - being so fidgety or restless that you have been moving around a lot more than usual: 0 Thoughts that you would be better off dead, or of hurting yourself in some way: 0 PHQ-9 Total Score: 1 If you checked off any problems, how difficult have these problems made it for you to do your work, take care of things at home, or get along with other people?: Not difficult at all     Goals Addressed   None          Objective:    There were no vitals filed for this visit. There is no height or weight on file to calculate BMI.  Hearing/Vision screen No results found. Immunizations and Health Maintenance Health Maintenance  Topic Date Due   Colonoscopy  03/21/2022   COVID-19 Vaccine (6 - 2025-26 season) 10/13/2023   Medicare Annual Wellness (AWV)  01/23/2024   Mammogram  03/09/2024   Influenza Vaccine  05/11/2024 (Originally 09/12/2023)   Bone Density Scan  03/09/2027   DTaP/Tdap/Td (3 - Td or Tdap) 01/20/2029   Pneumococcal Vaccine: 50+ Years  Completed   Hepatitis C Screening  Completed   Zoster Vaccines- Shingrix  Completed   Meningococcal B Vaccine  Aged Out   Lung Cancer Screening  Discontinued        Assessment/Plan:  This is a routine wellness examination for Alexanderia.  Patient Care Team: [name] BRAVO, MD as PCP -  General [name], RN as Oncology Nurse Navigator [name], [name], MD as Consulting Physician (Oncology) [facility], [facility]  I have personally reviewed and noted the following in the patients chart:   Medical and social history Use of alcohol, tobacco or illicit drugs  Current medications and supplements including opioid prescriptions. Functional ability and status Nutritional status Physical activity Advanced directives List of other physicians Hospitalizations, surgeries, and ER visits in previous 12 months Vitals Screenings to include cognitive, depression, and falls Referrals and appointments  No orders of the defined types were placed in this encounter.  In addition, I have reviewed and discussed with patient certain preventive protocols, quality metrics, and best practice recommendations. A written personalized care plan  for preventive services as well as general preventive health recommendations were provided to patient.   Arnette LOISE Hoots, CMA   01/26/2024   No follow-ups on file.  After Visit Summary: (Declined) Due to this being a telephonic visit, with patients personalized plan was offered to patient but patient Declined AVS at this time   Nurse Notes: Patient is due for colonoscopy and will be due for Mammo in January.Both orders placed. She will look into the new COVID vaccine. She has been unable to get her trelegy due to cost. I have sent a message to Dr Alfred to let him know and see if there is anyway to help the patinet get medication.

## 2024-01-26 NOTE — Telephone Encounter (Signed)
 Dr. Isadora, do you know of any other options for medications? She can not afford the Anoro due to her deductible, and it looks like all the other inhalers will be expensive as well.

## 2024-01-26 NOTE — Telephone Encounter (Signed)
 Copied from CRM [id number]. Topic: Clinical - Prescription Issue >> Jan 19, 2024  1:42 PM Joesph PARAS wrote: Reason for CRM: Patient has been informed of previous message. Patient states she does not know what to do. She cannot afford the cost of the inhaler and if alternatives will be no cheaper, she will not be able to afford those either. Please advise the patient. >> Jan 26, 2024 10:44 AM Russell PARAS wrote: Pt is returning call from [place] concerning her inhaler cost. She has reached out to her insurance; however, they were not able to provide more information on cost of alternative inhalers concerning her deductible. Is not sure how to move forward with treatment.  Requested call back  CB#  [phone]

## 2024-01-27 ENCOUNTER — Encounter: Payer: Self-pay | Admitting: Family Medicine

## 2024-01-27 ENCOUNTER — Other Ambulatory Visit: Payer: Self-pay

## 2024-01-27 ENCOUNTER — Ambulatory Visit: Admitting: Family Medicine

## 2024-01-27 VITALS — BP 130/90 | HR 106 | Temp 98.5°F | Ht 64.0 in | Wt 163.5 lb

## 2024-01-27 DIAGNOSIS — N898 Other specified noninflammatory disorders of vagina: Secondary | ICD-10-CM

## 2024-01-27 MED ORDER — METRONIDAZOLE 500 MG PO TABS: 500.0000 mg | ORAL_TABLET | Freq: Two times a day (BID) | ORAL | 0 refills | Status: DC

## 2024-01-27 MED ORDER — BORIC ACID 600 MG VA SUPP: 600.0000 mg | Freq: Every day | VAGINAL | 1 refills | Status: AC

## 2024-01-27 NOTE — Assessment & Plan Note (Signed)
 Acute, but persistent.  Likely continued bacterial vaginosis.  Will resend wet prep to verify. Will go ahead and treat empirically with metronidazole  500 mg p.o. twice daily x 7 days along with starting boric acid suppositories at bedtime for the next 3 to 4 weeks.  If symptoms not improving as expected will consider referral to gynecology.

## 2024-01-27 NOTE — Telephone Encounter (Signed)
 Reached out to pt via other encounter. Closing this thread.

## 2024-01-27 NOTE — Progress Notes (Signed)
 Patient ID: Connie West, female    DOB: 1955/05/28, 68 y.o.   MRN: [id number]  This visit was conducted in person.  BP (!) 130/90   Pulse (!) 106   Temp 98.5 F (36.9 C) (Temporal)   Ht 5' 4 (1.626 m)   Wt 163 lb 8 oz (74.2 kg)   SpO2 95%   BMI 28.06 kg/m    CC:  Chief Complaint  Patient presents with   Vaginal Discharge    Subjective:   HPI: Connie West is a 68 y.o. female presenting on 01/27/2024 for Vaginal Discharge  Patient treated for BV after positive wet prep December 02, 2023. Treated with metronidazole  500 mg BID x 7 days   Has not treated with anything else.   She noted initial improvement but then it worsened.  She is having vaginal discharge, slimy yellowish pinkish. Discomfort vaginally, no itching.  No abd pain.  No fever.   Not immunocomprised.      Relevant past medical, surgical, family and social history reviewed and updated as indicated. Interim medical history since our last visit reviewed. Allergies and medications reviewed and updated. Outpatient Medications Prior to Visit  Medication Sig Dispense Refill   aspirin EC 81 MG tablet Take 81 mg by mouth in the morning.     buPROPion  (WELLBUTRIN  XL) 150 MG 24 hr tablet TAKE 1 TABLET BY MOUTH IN THE MORNING. FOR MOOD AND FATIGUE. 90 tablet 1   calcium carbonate (OS-CAL) 600 MG TABS Take 600 mg by mouth every evening.     Coenzyme Q10 (CO Q 10 PO) Take 1 capsule by mouth in the morning.     Fluocinolone  Acetonide 0.01 % OIL Apply twice daily to ears as needed for rash/itching 20 mL 2   ibuprofen (ADVIL,MOTRIN) 200 MG tablet Take 400 mg by mouth every 8 (eight) hours as needed (pain.).     ketoconazole  (NIZORAL ) 2 % shampoo 2-3 times per week lather on scalp and ears, leave on 8-10 minutes, rinse well 120 mL 5   lidocaine -prilocaine  (EMLA ) cream Apply 1 Application topically as needed.     MAGNESIUM PO Take 1 tablet by mouth every evening.     OMEGA-3 FATTY ACIDS PO Take 1 g by mouth every  evening.     simvastatin  (ZOCOR ) 40 MG tablet TAKE 1 TABLET BY MOUTH EVERY DAY 90 tablet 3   VITAMIN D  PO Take 1,000 Units by mouth in the morning.     umeclidinium-vilanterol (ANORO ELLIPTA ) 62.5-25 MCG/ACT AEPB Inhale 1 puff into the lungs daily. (Patient not taking: Reported on 01/27/2024) 30 each 11   metroNIDAZOLE  (FLAGYL ) 500 MG tablet Take 1 tablet (500 mg total) by mouth 2 (two) times daily. 14 tablet 0   No facility-administered medications prior to visit.     Per HPI unless specifically indicated in ROS section below Review of Systems  Constitutional:  Negative for fatigue and fever.  HENT:  Negative for congestion.   Eyes:  Negative for pain.  Respiratory:  Negative for cough and shortness of breath.   Cardiovascular:  Negative for chest pain, palpitations and leg swelling.  Gastrointestinal:  Negative for abdominal pain.  Genitourinary:  Positive for vaginal discharge and vaginal pain. Negative for dysuria and vaginal bleeding.  Musculoskeletal:  Negative for back pain.  Neurological:  Negative for syncope, light-headedness and headaches.  Psychiatric/Behavioral:  Negative for dysphoric mood.    Objective:  BP (!) 130/90   Pulse (!) 106   Temp  98.5 F (36.9 C) (Temporal)   Ht 5' 4 (1.626 m)   Wt 163 lb 8 oz (74.2 kg)   SpO2 95%   BMI 28.06 kg/m   Wt Readings from Last 3 Encounters:  01/27/24 163 lb 8 oz (74.2 kg)  01/12/24 162 lb 12.8 oz (73.8 kg)  12/25/23 168 lb (76.2 kg)      Physical Exam Constitutional:      General: She is not in acute distress.    Appearance: Normal appearance. She is well-developed. She is not ill-appearing or toxic-appearing.  HENT:     Head: Normocephalic.     Right Ear: Hearing, tympanic membrane, ear canal and external ear normal. Tympanic membrane is not erythematous, retracted or bulging.     Left Ear: Hearing, tympanic membrane, ear canal and external ear normal. Tympanic membrane is not erythematous, retracted or bulging.      Nose: No mucosal edema or rhinorrhea.     Right Sinus: No maxillary sinus tenderness or frontal sinus tenderness.     Left Sinus: No maxillary sinus tenderness or frontal sinus tenderness.     Mouth/Throat:     Pharynx: Uvula midline.  Eyes:     General: Lids are normal. Lids are everted, no foreign bodies appreciated.     Conjunctiva/sclera: Conjunctivae normal.     Pupils: Pupils are equal, round, and reactive to light.  Neck:     Thyroid : No thyroid  mass or thyromegaly.     Vascular: No carotid bruit.     Trachea: Trachea normal.  Cardiovascular:     Rate and Rhythm: Normal rate and regular rhythm.     Pulses: Normal pulses.     Heart sounds: Normal heart sounds, S1 normal and S2 normal. No murmur heard.    No friction rub. No gallop.  Pulmonary:     Effort: Pulmonary effort is normal. No tachypnea or respiratory distress.     Breath sounds: Normal breath sounds. No decreased breath sounds, wheezing, rhonchi or rales.  Abdominal:     General: Bowel sounds are normal.     Palpations: Abdomen is soft.     Tenderness: There is no abdominal tenderness.  Genitourinary:    Vagina: Vaginal discharge, erythema and tenderness present. No lesions.  Musculoskeletal:     Cervical back: Normal range of motion and neck supple.  Skin:    General: Skin is warm and dry.     Findings: No rash.  Neurological:     Mental Status: She is alert.  Psychiatric:        Mood and Affect: Mood is not anxious or depressed.        Speech: Speech normal.        Behavior: Behavior normal. Behavior is cooperative.        Thought Content: Thought content normal.        Judgment: Judgment normal.       Results for orders placed or performed in visit on 12/02/23  POCT Urinalysis Dipstick (Automated)   Collection Time: 12/02/23 10:50 AM  Result Value Ref Range   Color, UA Yellow    Clarity, UA Clear    Glucose, UA Negative Negative   Bilirubin, UA Negative    Ketones, UA Negative    Spec Grav, UA  1.010 1.010 - 1.025   Blood, UA Negative    pH, UA 6.0 5.0 - 8.0   Protein, UA Negative Negative   Urobilinogen, UA 0.2 0.2 or 1.0 E.U./dL   Nitrite, UA Negative  Leukocytes, UA Large (3+) (A) Negative  WET PREP BY MOLECULAR PROBE   Collection Time: 12/02/23 11:04 AM   Specimen: Vaginal Swab  Result Value Ref Range   MICRO NUMBER: [id number]    SPECIMEN QUALITY: Adequate    SOURCE: NOT GIVEN    STATUS: FINAL    Trichomonas vaginosis Not Detected    Gardnerella vaginalis (A)     Detected. Increased levels of G. vaginalis may not be significant in the absence of signs and symptoms of bacterial vaginosis.   Candida species Not Detected   Urine Culture   Collection Time: 12/02/23 11:04 AM   Specimen: Urine  Result Value Ref Range   MICRO NUMBER: [id number]    SPECIMEN QUALITY: Adequate    Sample Source NOT GIVEN    STATUS: FINAL    Result: No Growth     Assessment and Plan  Vaginal discharge Assessment & Plan: Acute, but persistent.  Likely continued bacterial vaginosis.  Will resend wet prep to verify. Will go ahead and treat empirically with metronidazole  500 mg p.o. twice daily x 7 days along with starting boric acid suppositories at bedtime for the next 3 to 4 weeks.  If symptoms not improving as expected will consider referral to gynecology.  Orders: -     WET PREP BY MOLECULAR PROBE  Other orders -     metroNIDAZOLE ; Take 1 tablet (500 mg total) by mouth 2 (two) times daily.  Dispense: 14 tablet; Refill: 0 -     Boric Acid; Place 600 mg vaginally at bedtime.  Dispense: 30 suppository; Refill: 1    No follow-ups on file.   Greig Ring, MD

## 2024-01-28 ENCOUNTER — Ambulatory Visit: Payer: Self-pay | Admitting: Family Medicine

## 2024-01-28 LAB — WET PREP BY MOLECULAR PROBE
Candida species: NOT DETECTED
MICRO NUMBER:: 17362138
SPECIMEN QUALITY:: ADEQUATE
Trichomonas vaginosis: NOT DETECTED

## 2024-02-09 ENCOUNTER — Other Ambulatory Visit: Payer: Self-pay

## 2024-02-09 ENCOUNTER — Telehealth: Payer: Self-pay | Admitting: *Deleted

## 2024-02-09 DIAGNOSIS — Z860100 Personal history of colon polyps, unspecified: Secondary | ICD-10-CM

## 2024-02-09 DIAGNOSIS — E559 Vitamin D deficiency, unspecified: Secondary | ICD-10-CM

## 2024-02-09 DIAGNOSIS — R7303 Prediabetes: Secondary | ICD-10-CM

## 2024-02-09 DIAGNOSIS — E7800 Pure hypercholesterolemia, unspecified: Secondary | ICD-10-CM

## 2024-02-09 MED ORDER — NA SULFATE-K SULFATE-MG SULF 17.5-3.13-1.6 GM/177ML PO SOLN: 354.0000 mL | Freq: Once | ORAL | 0 refills | Status: AC

## 2024-02-09 NOTE — Addendum Note (Signed)
 Addended by: JODIE HEADINGS on: 02/09/2024 09:47 AM   Modules accepted: Orders

## 2024-02-09 NOTE — Telephone Encounter (Signed)
-----   Message from Harlene Du sent at 02/06/2024  1:45 PM EST ----- Regarding: Lab Tues 02/17/24 Hello,  Patient is coming in for CPE labs. Can we get orders please.   Thanks

## 2024-02-17 ENCOUNTER — Other Ambulatory Visit (INDEPENDENT_AMBULATORY_CARE_PROVIDER_SITE_OTHER): Payer: Medicare Other

## 2024-02-17 ENCOUNTER — Ambulatory Visit: Payer: Self-pay | Admitting: Family Medicine

## 2024-02-17 DIAGNOSIS — E559 Vitamin D deficiency, unspecified: Secondary | ICD-10-CM

## 2024-02-17 DIAGNOSIS — R7303 Prediabetes: Secondary | ICD-10-CM

## 2024-02-17 DIAGNOSIS — E7800 Pure hypercholesterolemia, unspecified: Secondary | ICD-10-CM | POA: Diagnosis not present

## 2024-02-17 LAB — LIPID PANEL
Cholesterol: 170 mg/dL (ref 28–200)
HDL: 57.7 mg/dL
LDL Cholesterol: 85 mg/dL (ref 10–99)
NonHDL: 112.47
Total CHOL/HDL Ratio: 3
Triglycerides: 138 mg/dL (ref 10.0–149.0)
VLDL: 27.6 mg/dL (ref 0.0–40.0)

## 2024-02-17 LAB — HEMOGLOBIN A1C: Hgb A1c MFr Bld: 5.6 % (ref 4.6–6.5)

## 2024-02-17 LAB — VITAMIN D 25 HYDROXY (VIT D DEFICIENCY, FRACTURES): VITD: 49.47 ng/mL (ref 30.00–100.00)

## 2024-02-17 NOTE — Progress Notes (Signed)
 No critical labs need to be addressed urgently. We will discuss labs in detail at upcoming office visit.

## 2024-02-24 ENCOUNTER — Encounter: Payer: Self-pay | Admitting: Family Medicine

## 2024-02-24 ENCOUNTER — Ambulatory Visit (INDEPENDENT_AMBULATORY_CARE_PROVIDER_SITE_OTHER): Payer: Medicare Other | Admitting: Family Medicine

## 2024-02-24 VITALS — BP 112/80 | HR 102 | Temp 98.0°F | Ht 64.75 in | Wt 163.0 lb

## 2024-02-24 DIAGNOSIS — E7800 Pure hypercholesterolemia, unspecified: Secondary | ICD-10-CM | POA: Diagnosis not present

## 2024-02-24 DIAGNOSIS — R7303 Prediabetes: Secondary | ICD-10-CM

## 2024-02-24 DIAGNOSIS — Z0000 Encounter for general adult medical examination without abnormal findings: Secondary | ICD-10-CM | POA: Diagnosis not present

## 2024-02-24 DIAGNOSIS — E559 Vitamin D deficiency, unspecified: Secondary | ICD-10-CM | POA: Diagnosis not present

## 2024-02-24 DIAGNOSIS — Z85118 Personal history of other malignant neoplasm of bronchus and lung: Secondary | ICD-10-CM | POA: Diagnosis not present

## 2024-02-24 NOTE — Assessment & Plan Note (Signed)
 Chronic,  stable control.  She will work on low carbohydrate diet.  She will return to regular exercise.

## 2024-02-24 NOTE — Assessment & Plan Note (Signed)
"   Chronic, almost at goal LDL < 70 on simvastatin  40 mg daily "

## 2024-02-24 NOTE — Progress Notes (Signed)
 "   Patient ID: Connie West, female    DOB: Jun 12, 1955, 69 y.o.   MRN: [id number]  This visit was conducted in person.  BP 112/80   Pulse (!) 102   Temp 98 F (36.7 C) (Temporal)   Ht 5' 4.75 (1.645 m)   Wt 163 lb (73.9 kg)   SpO2 98%   BMI 27.33 kg/m    CC:  Chief Complaint  Patient presents with   Annual Exam    Part 2 MWV 01/26/2024    Subjective:   HPI: Connie West is a 69 y.o. female presenting on 02/24/2024 for Annual Exam (Part 2/MWV 01/26/2024)  The patient presents for complete physical and review of chronic health problems. He/She also has the following acute concerns today:    Improved vaginal symptoms... using boric acid.    The patient saw a LPN or RN for medicare wellness visit. 01/26/2024  Prevention and wellness was reviewed in detail. Note reviewed and important notes copied below.   Pt dx with lung cancer on CT screening... followed by D.r [name] Reviewed last office visit note from  Pulmonary 01/12/2024 Dr Isadora Biopsy from bronchoscopy on June 07, 2022 confirming the diagnosis. PET scan results from May 22, 2022 reviewed independently confirming stage of disease. The bilateral hypermetabolic cervical lymph nodes are suspicious, but will treat patient as a stage IIIb. MRI of the brain on Jun 19, 2022 did not reveal any metastatic disease. Patient completed weekly carboplatin  and Taxol  on July 31, 2022.  Completed  maintenance durvalumab  every 2 weeks for 1 year.  01/12/2024: Stable with no disease progression on recent imaging. CT shows post-radiation changes with significant scarring.   Started on Anoro Ellipta  by Pulmonary for SOB.,  Elevated Cholesterol:  Hx of CVA, on simvastatin   40 mg daily.  LDL almost at goal < 70 Lab Results  Component Value Date   CHOL 170 02/17/2024   HDL 57.70 02/17/2024   LDLCALC 85 02/17/2024   LDLDIRECT 111.0 02/01/2021   TRIG 138.0 02/17/2024   CHOLHDL 3 02/17/2024  Using medications without problems: Muscle  aches:  Diet compliance: heart healthy diet Exercise:  walking off and on but shortness of breath with exercsie. Other complaints:  Wt Readings from Last 3 Encounters:  02/24/24 163 lb (73.9 kg)  01/27/24 163 lb 8 oz (74.2 kg)  01/12/24 162 lb 12.8 oz (73.8 kg)     Prediabetes  Lab Results  Component Value Date   HGBA1C 5.6 02/17/2024   Vit D:  at goal.     Relevant past medical, surgical, family and social history reviewed and updated as indicated. Interim medical history since our last visit reviewed. Allergies and medications reviewed and updated. Outpatient Medications Prior to Visit  Medication Sig Dispense Refill   aspirin EC 81 MG tablet Take 81 mg by mouth in the morning.     Boric Acid 600 MG SUPP Place 600 mg vaginally at bedtime. 30 suppository 1   buPROPion  (WELLBUTRIN  XL) 150 MG 24 hr tablet TAKE 1 TABLET BY MOUTH IN THE MORNING. FOR MOOD AND FATIGUE. 90 tablet 1   calcium carbonate (OS-CAL) 600 MG TABS Take 600 mg by mouth every evening.     Coenzyme Q10 (CO Q 10 PO) Take 1 capsule by mouth in the morning.     Fluocinolone  Acetonide 0.01 % OIL Apply twice daily to ears as needed for rash/itching 20 mL 2   ibuprofen (ADVIL,MOTRIN) 200 MG tablet Take 400 mg by mouth  every 8 (eight) hours as needed (pain.).     ketoconazole  (NIZORAL ) 2 % shampoo 2-3 times per week lather on scalp and ears, leave on 8-10 minutes, rinse well 120 mL 5   lidocaine -prilocaine  (EMLA ) cream Apply 1 Application topically as needed.     MAGNESIUM PO Take 1 tablet by mouth every evening.     OMEGA-3 FATTY ACIDS PO Take 1 g by mouth every evening.     simvastatin  (ZOCOR ) 40 MG tablet TAKE 1 TABLET BY MOUTH EVERY DAY 90 tablet 3   umeclidinium-vilanterol (ANORO ELLIPTA ) 62.5-25 MCG/ACT AEPB Inhale 1 puff into the lungs daily. 30 each 11   VITAMIN D  PO Take 1,000 Units by mouth in the morning.     metroNIDAZOLE  (FLAGYL ) 500 MG tablet Take 1 tablet (500 mg total) by mouth 2 (two) times daily. 14  tablet 0   No facility-administered medications prior to visit.     Per HPI unless specifically indicated in ROS section below Review of Systems  Constitutional:  Positive for fatigue. Negative for fever.  HENT:  Negative for congestion.   Eyes:  Negative for pain.  Respiratory:  Negative for cough and shortness of breath.   Cardiovascular:  Negative for chest pain, palpitations and leg swelling.  Gastrointestinal:  Negative for abdominal pain.  Genitourinary:  Negative for dysuria and vaginal bleeding.  Musculoskeletal:  Negative for back pain.  Neurological:  Negative for syncope, light-headedness and headaches.  Psychiatric/Behavioral:  Negative for dysphoric mood.    Objective:  BP 112/80   Pulse (!) 102   Temp 98 F (36.7 C) (Temporal)   Ht 5' 4.75 (1.645 m)   Wt 163 lb (73.9 kg)   SpO2 98%   BMI 27.33 kg/m   Wt Readings from Last 3 Encounters:  02/24/24 163 lb (73.9 kg)  01/27/24 163 lb 8 oz (74.2 kg)  01/12/24 162 lb 12.8 oz (73.8 kg)      Physical Exam Vitals and nursing note reviewed.  Constitutional:      General: She is not in acute distress.    Appearance: Normal appearance. She is well-developed. She is not ill-appearing or toxic-appearing.  HENT:     Head: Normocephalic.     Right Ear: Hearing, tympanic membrane, ear canal and external ear normal.     Left Ear: Hearing, tympanic membrane, ear canal and external ear normal.     Nose: Nose normal.  Eyes:     General: Lids are normal. Lids are everted, no foreign bodies appreciated.     Conjunctiva/sclera: Conjunctivae normal.     Pupils: Pupils are equal, round, and reactive to light.  Neck:     Thyroid : No thyroid  mass or thyromegaly.     Vascular: No carotid bruit.     Trachea: Trachea normal.  Cardiovascular:     Rate and Rhythm: Normal rate and regular rhythm.     Pulses: Normal pulses.     Heart sounds: S1 normal and S2 normal. Murmur heard.     Systolic murmur is present with a grade of 1/6.      No diastolic murmur is present.     No gallop.  Pulmonary:     Effort: Pulmonary effort is normal. No respiratory distress.     Breath sounds: Normal breath sounds. No wheezing, rhonchi or rales.  Abdominal:     General: Bowel sounds are normal. There is no distension or abdominal bruit.     Palpations: Abdomen is soft. There is no fluid wave  or mass.     Tenderness: There is no abdominal tenderness. There is no guarding or rebound.     Hernia: No hernia is present.  Musculoskeletal:     Cervical back: Normal range of motion and neck supple.  Lymphadenopathy:     Cervical: No cervical adenopathy.  Skin:    General: Skin is warm and dry.     Findings: No rash.  Neurological:     Mental Status: She is alert.     Cranial Nerves: No cranial nerve deficit.     Sensory: No sensory deficit.  Psychiatric:        Mood and Affect: Mood is not anxious or depressed.        Speech: Speech normal.        Behavior: Behavior normal. Behavior is cooperative.        Judgment: Judgment normal.       Results for orders placed or performed in visit on 02/17/24  Hemoglobin A1c   Collection Time: 02/17/24  7:59 AM  Result Value Ref Range   Hgb A1c MFr Bld 5.6 4.6 - 6.5 %  Lipid panel   Collection Time: 02/17/24  7:59 AM  Result Value Ref Range   Cholesterol 170 28 - 200 mg/dL   Triglycerides 861.9 89.9 - 149.0 mg/dL   HDL 42.29 >60.99 mg/dL   VLDL 72.3 0.0 - 59.9 mg/dL   LDL Cholesterol 85 10 - 99 mg/dL   Total CHOL/HDL Ratio 3    NonHDL 112.47   VITAMIN D  25 Hydroxy (Vit-D Deficiency, Fractures)   Collection Time: 02/17/24  7:59 AM  Result Value Ref Range   VITD 49.47 30.00 - 100.00 ng/mL     COVID 19 screen:  No recent travel or known exposure to COVID19 The patient denies respiratory symptoms of COVID 19 at this time. The importance of social distancing was discussed today.   Assessment and Plan   The patient's preventative maintenance and recommended screening tests for an  annual wellness exam were reviewed in full today. Brought up to date unless services declined.  Counselled on the importance of diet, exercise, and its role in overall health and mortality. The patient's FH and SH was reviewed, including their home life, tobacco status, and drug and alcohol status.   Vaccines:  had flu 11/2022, COVID x 3, Tdap,  shingles vaccine, prevnar 20 uptodate Mammo: 02/2023 nml  Mother with early breast cancer. 2026 scheduled. Colon:   high risk polyps  03/21/21, 1 year recall.. DUE .SABRA Has appt scheduled PAP/DVE: Every 5 years pap, DVE yearly.  Last pap 01/2020 neg HPV, no lower abdominal symptoms.. , no further indicated. DXA: 02/2022 Stable osteopenia in hip, normal density in spine, repeat in 5 years   Former smoker, 30 pack year history.  Quit 2016  Hep C: neg.  STD screen/HIV: refused  Problem List Items Addressed This Visit     History of lung cancer   01/12/2024 Stable with no disease progression on recent imaging. CT shows post-radiation changes with significant scarring.   Followed q 6 months by [facility].      Prediabetes   Chronic,  stable control.  She will work on low carbohydrate diet.  She will return to regular exercise.      Pure hypercholesterolemia (Chronic)    Chronic, almost at goal LDL < 70 on simvastatin  40 mg daily      Vitamin D  deficiency   Chronic, resolved with supplementation.      Other  Visit Diagnoses       Routine general medical examination at a health care facility    -  Primary        No orders of the defined types were placed in this encounter.   Greig Ring, MD   "

## 2024-02-24 NOTE — Assessment & Plan Note (Signed)
 01/12/2024 Stable with no disease progression on recent imaging. CT shows post-radiation changes with significant scarring.   Followed q 6 months by [facility].

## 2024-02-24 NOTE — Assessment & Plan Note (Signed)
Chronic, resolved with supplementation 

## 2024-02-25 ENCOUNTER — Other Ambulatory Visit: Payer: Self-pay

## 2024-03-10 ENCOUNTER — Ambulatory Visit
Admission: RE | Admit: 2024-03-10 | Discharge: 2024-03-10 | Disposition: A | Discharge status: Home / Self Care | Source: Ambulatory Visit | Attending: Family Medicine | Admitting: Family Medicine

## 2024-03-10 DIAGNOSIS — Z1231 Encounter for screening mammogram for malignant neoplasm of breast: Secondary | ICD-10-CM | POA: Insufficient documentation

## 2024-03-17 ENCOUNTER — Ambulatory Visit: Payer: Self-pay | Admitting: Family Medicine

## 2024-05-11 ENCOUNTER — Ambulatory Visit: Admit: 2024-05-11 | Admitting: Gastroenterology

## 2024-05-11 SURGERY — COLONOSCOPY
Anesthesia: General

## 2024-05-13 ENCOUNTER — Other Ambulatory Visit

## 2024-05-19 ENCOUNTER — Ambulatory Visit: Admitting: Radiation Oncology

## 2024-05-27 ENCOUNTER — Ambulatory Visit: Admitting: Oncology

## 2025-01-26 ENCOUNTER — Ambulatory Visit

## 2025-02-17 ENCOUNTER — Other Ambulatory Visit

## 2025-02-24 ENCOUNTER — Encounter: Admitting: Family Medicine
